# Patient Record
Sex: Male | Born: 1955 | Race: White | Hispanic: No | Marital: Married | Smoking: Never smoker
Health system: Southern US, Community
[De-identification: ages and names within clinical notes are randomized; demographics above are authoritative.]

## PROBLEM LIST (undated history)

## (undated) DIAGNOSIS — Z9889 Other specified postprocedural states: Secondary | ICD-10-CM

## (undated) DIAGNOSIS — Z9289 Personal history of other medical treatment: Secondary | ICD-10-CM

## (undated) DIAGNOSIS — K219 Gastro-esophageal reflux disease without esophagitis: Secondary | ICD-10-CM

## (undated) DIAGNOSIS — E8809 Other disorders of plasma-protein metabolism, not elsewhere classified: Secondary | ICD-10-CM

## (undated) DIAGNOSIS — E756 Lipid storage disorder, unspecified: Secondary | ICD-10-CM

## (undated) DIAGNOSIS — I2510 Atherosclerotic heart disease of native coronary artery without angina pectoris: Secondary | ICD-10-CM

## (undated) DIAGNOSIS — Z8679 Personal history of other diseases of the circulatory system: Secondary | ICD-10-CM

## (undated) DIAGNOSIS — I10 Essential (primary) hypertension: Secondary | ICD-10-CM

## (undated) DIAGNOSIS — I82409 Acute embolism and thrombosis of unspecified deep veins of unspecified lower extremity: Secondary | ICD-10-CM

## (undated) DIAGNOSIS — I712 Thoracic aortic aneurysm, without rupture: Secondary | ICD-10-CM

## (undated) DIAGNOSIS — J189 Pneumonia, unspecified organism: Secondary | ICD-10-CM

## (undated) HISTORY — DX: Thoracic aortic aneurysm, without rupture: I71.2

## (undated) HISTORY — DX: Other specified postprocedural states: Z98.890

## (undated) HISTORY — DX: Other disorders of plasma-protein metabolism, not elsewhere classified: E88.09

## (undated) HISTORY — PX: OTHER SURGICAL HISTORY: SHX169

## (undated) HISTORY — PX: EYE SURGERY: SHX253

## (undated) HISTORY — DX: Atherosclerotic heart disease of native coronary artery without angina pectoris: I25.10

## (undated) HISTORY — PX: TONSILLECTOMY: SUR1361

## (undated) HISTORY — DX: Lipid storage disorder, unspecified: E75.6

## (undated) HISTORY — DX: Acute embolism and thrombosis of unspecified deep veins of unspecified lower extremity: I82.409

## (undated) HISTORY — DX: Personal history of other diseases of the circulatory system: Z86.79

---

## 2002-08-02 DIAGNOSIS — I82409 Acute embolism and thrombosis of unspecified deep veins of unspecified lower extremity: Secondary | ICD-10-CM

## 2002-08-02 HISTORY — DX: Acute embolism and thrombosis of unspecified deep veins of unspecified lower extremity: I82.409

## 2002-10-26 ENCOUNTER — Emergency Department (HOSPITAL_COMMUNITY): Admission: EM | Admit: 2002-10-26 | Discharge: 2002-10-26 | Payer: Self-pay | Admitting: Emergency Medicine

## 2002-11-05 ENCOUNTER — Encounter: Payer: Self-pay | Admitting: Cardiology

## 2002-11-05 ENCOUNTER — Inpatient Hospital Stay (HOSPITAL_COMMUNITY): Admission: EM | Admit: 2002-11-05 | Discharge: 2002-11-08 | Payer: Self-pay | Admitting: Emergency Medicine

## 2002-11-06 ENCOUNTER — Encounter: Payer: Self-pay | Admitting: Cardiology

## 2007-12-29 ENCOUNTER — Emergency Department (HOSPITAL_COMMUNITY): Admission: EM | Admit: 2007-12-29 | Discharge: 2007-12-29 | Payer: Self-pay | Admitting: Emergency Medicine

## 2008-02-27 ENCOUNTER — Emergency Department (HOSPITAL_COMMUNITY): Admission: EM | Admit: 2008-02-27 | Discharge: 2008-02-27 | Payer: Self-pay | Admitting: Neurosurgery

## 2008-10-16 ENCOUNTER — Emergency Department (HOSPITAL_BASED_OUTPATIENT_CLINIC_OR_DEPARTMENT_OTHER): Admission: EM | Admit: 2008-10-16 | Discharge: 2008-10-16 | Payer: Self-pay | Admitting: Emergency Medicine

## 2008-10-16 ENCOUNTER — Ambulatory Visit: Payer: Self-pay | Admitting: Diagnostic Radiology

## 2008-11-09 ENCOUNTER — Emergency Department (HOSPITAL_BASED_OUTPATIENT_CLINIC_OR_DEPARTMENT_OTHER): Admission: EM | Admit: 2008-11-09 | Discharge: 2008-11-09 | Payer: Self-pay | Admitting: Emergency Medicine

## 2008-11-09 ENCOUNTER — Ambulatory Visit: Payer: Self-pay | Admitting: Radiology

## 2008-11-12 ENCOUNTER — Ambulatory Visit (HOSPITAL_COMMUNITY): Admission: RE | Admit: 2008-11-12 | Discharge: 2008-11-12 | Payer: Self-pay | Admitting: Orthopaedic Surgery

## 2009-10-12 ENCOUNTER — Emergency Department (HOSPITAL_COMMUNITY): Admission: EM | Admit: 2009-10-12 | Discharge: 2009-10-12 | Payer: Self-pay | Admitting: Emergency Medicine

## 2010-08-14 ENCOUNTER — Ambulatory Visit: Payer: Self-pay | Admitting: Cardiology

## 2010-08-18 ENCOUNTER — Emergency Department (HOSPITAL_COMMUNITY)
Admission: EM | Admit: 2010-08-18 | Discharge: 2010-08-19 | Payer: Self-pay | Source: Home / Self Care | Admitting: Emergency Medicine

## 2010-08-24 LAB — COMPREHENSIVE METABOLIC PANEL
ALT: 19 U/L (ref 0–53)
AST: 18 U/L (ref 0–37)
Albumin: 3.8 g/dL (ref 3.5–5.2)
CO2: 21 mEq/L (ref 19–32)
Chloride: 112 mEq/L (ref 96–112)
Creatinine, Ser: 1.24 mg/dL (ref 0.4–1.5)
GFR calc Af Amer: >60 mL/min (ref >60)
GFR calc non Af Amer: >60 mL/min (ref >60)
Sodium: 141 mEq/L (ref 135–145)
Total Bilirubin: 1.1 mg/dL (ref 0.3–1.2)

## 2010-08-24 LAB — CBC
Hemoglobin: 13.1 g/dL (ref 13.0–17.0)
MCH: 27 pg (ref 26.0–34.0)
MCHC: 33.6 g/dL (ref 30.0–36.0)
Platelets: 220 10*3/uL (ref 150–400)
RDW: 13.9 % (ref 11.5–15.5)

## 2010-08-24 LAB — URINALYSIS, ROUTINE W REFLEX MICROSCOPIC
Bilirubin Urine: NEGATIVE
Hgb urine dipstick: NEGATIVE
Protein, ur: NEGATIVE mg/dL
Specific Gravity, Urine: 1.021 (ref 1.005–1.030)
Urine Glucose, Fasting: NEGATIVE mg/dL
Urobilinogen, UA: 0.2 mg/dL (ref 0.0–1.0)

## 2010-08-24 LAB — DIFFERENTIAL
Basophils Relative: 0 % (ref 0–1)
Eosinophils Absolute: 0 10*3/uL (ref 0.0–0.7)
Eosinophils Relative: 0 % (ref 0–5)
Monocytes Absolute: 0.7 10*3/uL (ref 0.1–1.0)
Monocytes Relative: 5 % (ref 3–12)

## 2010-09-15 ENCOUNTER — Emergency Department (HOSPITAL_COMMUNITY)
Admission: EM | Admit: 2010-09-15 | Discharge: 2010-09-15 | Disposition: A | Discharge status: Home / Self Care | Payer: 59 | Attending: Emergency Medicine | Admitting: Emergency Medicine

## 2010-09-15 ENCOUNTER — Emergency Department (HOSPITAL_COMMUNITY): Payer: 59

## 2010-09-15 DIAGNOSIS — K5289 Other specified noninfective gastroenteritis and colitis: Secondary | ICD-10-CM | POA: Insufficient documentation

## 2010-09-15 DIAGNOSIS — R109 Unspecified abdominal pain: Secondary | ICD-10-CM | POA: Insufficient documentation

## 2010-09-15 DIAGNOSIS — R197 Diarrhea, unspecified: Secondary | ICD-10-CM | POA: Insufficient documentation

## 2010-09-15 DIAGNOSIS — E860 Dehydration: Secondary | ICD-10-CM | POA: Insufficient documentation

## 2010-09-15 LAB — DIFFERENTIAL
Basophils Absolute: 0 10*3/uL (ref 0.0–0.1)
Basophils Relative: 0 % (ref 0–1)
Eosinophils Absolute: 0 10*3/uL (ref 0.0–0.7)
Eosinophils Relative: 0 % (ref 0–5)
Monocytes Absolute: 0.5 10*3/uL (ref 0.1–1.0)

## 2010-09-15 LAB — COMPREHENSIVE METABOLIC PANEL
ALT: 30 U/L (ref 0–53)
AST: 26 U/L (ref 0–37)
Alkaline Phosphatase: 44 U/L (ref 39–117)
CO2: 22 mEq/L (ref 19–32)
Chloride: 107 mEq/L (ref 96–112)
GFR calc Af Amer: 55 mL/min — ABNORMAL LOW (ref >60)
GFR calc non Af Amer: 46 mL/min — ABNORMAL LOW (ref >60)
Glucose, Bld: 203 mg/dL — ABNORMAL HIGH (ref 70–99)
Sodium: 140 mEq/L (ref 135–145)
Total Bilirubin: 1.2 mg/dL (ref 0.3–1.2)

## 2010-09-15 LAB — CBC
HCT: 44.4 % (ref 39.0–52.0)
MCHC: 34.2 g/dL (ref 30.0–36.0)
Platelets: 216 10*3/uL (ref 150–400)
RDW: 13.8 % (ref 11.5–15.5)
WBC: 14.7 10*3/uL — ABNORMAL HIGH (ref 4.0–10.5)

## 2010-09-15 LAB — LIPASE, BLOOD: Lipase: 26 U/L (ref 11–59)

## 2010-09-18 ENCOUNTER — Other Ambulatory Visit: Payer: Self-pay | Admitting: Cardiology

## 2010-09-22 ENCOUNTER — Other Ambulatory Visit: Payer: 59

## 2010-09-24 ENCOUNTER — Ambulatory Visit (HOSPITAL_COMMUNITY)
Admission: RE | Admit: 2010-09-24 | Discharge: 2010-09-24 | Disposition: A | Discharge status: Home / Self Care | Payer: 59 | Source: Ambulatory Visit | Attending: Cardiology | Admitting: Cardiology

## 2010-09-24 DIAGNOSIS — K7689 Other specified diseases of liver: Secondary | ICD-10-CM | POA: Insufficient documentation

## 2010-09-24 DIAGNOSIS — R197 Diarrhea, unspecified: Secondary | ICD-10-CM | POA: Insufficient documentation

## 2010-09-24 DIAGNOSIS — R110 Nausea: Secondary | ICD-10-CM | POA: Insufficient documentation

## 2010-09-24 DIAGNOSIS — R109 Unspecified abdominal pain: Secondary | ICD-10-CM | POA: Insufficient documentation

## 2010-10-01 ENCOUNTER — Other Ambulatory Visit (HOSPITAL_COMMUNITY): Payer: Self-pay | Admitting: Gastroenterology

## 2010-10-01 DIAGNOSIS — R1011 Right upper quadrant pain: Secondary | ICD-10-CM

## 2010-10-20 ENCOUNTER — Encounter (HOSPITAL_COMMUNITY): Payer: Self-pay

## 2010-10-20 ENCOUNTER — Encounter (HOSPITAL_COMMUNITY)
Admission: RE | Admit: 2010-10-20 | Discharge: 2010-10-20 | Disposition: A | Discharge status: Home / Self Care | Payer: 59 | Source: Ambulatory Visit | Attending: Gastroenterology | Admitting: Gastroenterology

## 2010-10-20 DIAGNOSIS — R1011 Right upper quadrant pain: Secondary | ICD-10-CM | POA: Insufficient documentation

## 2010-10-20 MED ORDER — TECHNETIUM TC 99M MEBROFENIN IV KIT
4.5000 | PACK | Freq: Once | INTRAVENOUS | Status: AC | PRN
  Administered 2010-10-20: 5 via INTRAVENOUS

## 2010-10-20 MED ORDER — SINCALIDE 5 MCG IJ SOLR: 0.0200 ug/kg | Freq: Once | INTRAMUSCULAR | Status: DC

## 2010-11-12 LAB — DIFFERENTIAL
Basophils Absolute: 0 10*3/uL (ref 0.0–0.1)
Lymphocytes Relative: 26 % (ref 12–46)
Lymphs Abs: 2.3 10*3/uL (ref 0.7–4.0)
Monocytes Absolute: 0.5 10*3/uL (ref 0.1–1.0)
Neutro Abs: 5.7 10*3/uL (ref 1.7–7.7)

## 2010-11-12 LAB — BASIC METABOLIC PANEL
Calcium: 8.8 mg/dL (ref 8.4–10.5)
GFR calc Af Amer: >60 mL/min (ref >60)
GFR calc non Af Amer: >60 mL/min (ref >60)
Glucose, Bld: 95 mg/dL (ref 70–99)
Sodium: 142 mEq/L (ref 135–145)

## 2010-11-12 LAB — CBC
Hemoglobin: 14.2 g/dL (ref 13.0–17.0)
RDW: 13.5 % (ref 11.5–15.5)
WBC: 8.7 10*3/uL (ref 4.0–10.5)

## 2010-11-13 ENCOUNTER — Ambulatory Visit (HOSPITAL_COMMUNITY)
Admission: RE | Admit: 2010-11-13 | Discharge: 2010-11-13 | Disposition: A | Discharge status: Home / Self Care | Payer: 59 | Source: Ambulatory Visit | Attending: Gastroenterology | Admitting: Gastroenterology

## 2010-11-13 ENCOUNTER — Other Ambulatory Visit: Payer: Self-pay | Admitting: Gastroenterology

## 2010-11-13 DIAGNOSIS — K219 Gastro-esophageal reflux disease without esophagitis: Secondary | ICD-10-CM | POA: Insufficient documentation

## 2010-11-13 DIAGNOSIS — D130 Benign neoplasm of esophagus: Secondary | ICD-10-CM | POA: Insufficient documentation

## 2010-11-13 DIAGNOSIS — R110 Nausea: Secondary | ICD-10-CM | POA: Insufficient documentation

## 2010-11-13 DIAGNOSIS — R197 Diarrhea, unspecified: Secondary | ICD-10-CM | POA: Insufficient documentation

## 2010-11-13 DIAGNOSIS — R1013 Epigastric pain: Secondary | ICD-10-CM | POA: Insufficient documentation

## 2010-11-13 DIAGNOSIS — I10 Essential (primary) hypertension: Secondary | ICD-10-CM | POA: Insufficient documentation

## 2010-11-13 DIAGNOSIS — K5289 Other specified noninfective gastroenteritis and colitis: Secondary | ICD-10-CM | POA: Insufficient documentation

## 2010-12-18 NOTE — Discharge Summary (Signed)
NAME:  Lawrence Liu, Lawrence Liu                        ACCOUNT NO.:  [id number]   MEDICAL RECORD NO.:  [id number]                   PATIENT TYPE:  INP   LOCATION:  3032                                 FACILITY:  [facility]   PHYSICIAN:  Cassell Clement, M.D.              DATE OF BIRTH:  1955-11-17   DATE OF ADMISSION:  11/05/2002  DATE OF DISCHARGE:  11/08/2002                                 DISCHARGE SUMMARY   DISCHARGE DIAGNOSES:  1. Deep venous thrombosis of left leg.  2. Essential hypertension.  3. Esophageal reflux.   PROCEDURES:  None.   HISTORY OF PRESENT ILLNESS:  This 55 year old gentleman was admitted through  the emergency room because of left calf swelling.  There is no history of  any injury to the leg.  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  The patient has had no prior history of deep venous thrombosis.   ALLERGIES:  LATEX and also cannot mix PENICILLIN and SULFA, but can take  either one by itself.   SOCIAL HISTORY:  He is a nonsmoker.  He has had borderline blood pressure  elevations in the past.   PHYSICAL EXAMINATION:  VITAL SIGNS:  Blood pressure 130/95, pulse 80 and  regular, respirations normal.  HEENT:  Negative.  NECK:  Carotids negative.  CHEST:  Clear.  HEART:  No murmurs, rubs, gallop or click.  ABDOMEN:  Soft, nontender with no mass.  EXTREMITIES:  Good pulses with no edema.   LABORATORY DATA AND X-RAY FINDINGS:  We had a chest x-ray done in the  emergency room and results are pending.  The patient underwent an ultrasound  of the calves of his legs which did reveal the presence of a left deep  venous thrombosis in the left upper calf.   HOSPITAL COURSE:  The patient was started on Coumadin.  He was also started  on Lovenox until the Coumadin would therapeutic.  His blood pressure was  monitored carefully.  By November 07, 2002, blood pressure was improved.  A  2D  echocardiogram had shown low normal ejection fraction with a dilated aortic  groove, but no significant valvular heart disease.  The patient was anxious  to get out of the hospital.  On November 08, 2002, we felt that he was stable  enough to be discharged home, although he would need to stay on Lovenox  until his Coumadin was therapeutic and there was several days overlap.  The  patient did not develop any pleuritic type chest discomfort of increasing  dyspnea.  The left leg swelling and tenderness gradually improved over the  course of hospital stay.   DISCHARGE MEDICATIONS:  1. Lovenox 90 mg subcu every 12 hours.  2. Pepcid over-the-counter as needed for indigestion.  3. Altace 5 mg b.i.d.  4. Coumadin 5 mg  as directed.  5. Tylenol p.r.n.   ACTIVITY:  The patient will be resting at home.  Anticipate he will be out  of work at least until June 1.  He is to keep the left leg elevated when  sitting.   DIET:  He is to avoid added salt.   FOLLOW UP:  He will return to the office in one week for an office visit,  protime and BMET.  He is to call for an appointment.   CONDITION ON DISCHARGE:  Improved.                                               Cassell Clement, M.D.    TB/MEDQ  D:  12/10/2002  T:  12/12/2002  Job:  [id number]

## 2010-12-18 NOTE — H&P (Signed)
NAME:  Lawrence Liu, Lawrence Liu                        ACCOUNT NO.:  [id number]   MEDICAL RECORD NO.:  [id number]                   PATIENT TYPE:  INP   LOCATION:  3032                                 FACILITY:  [facility]   PHYSICIAN:  Cassell Clement, M.D.              DATE OF BIRTH:  Jan 01, 1956   DATE OF ADMISSION:  11/05/2002  DATE OF DISCHARGE:                                HISTORY & PHYSICAL   CHIEF COMPLAINT:  Deep vein thrombosis, left calf.   HISTORY OF PRESENT ILLNESS:  This is a 55 year old married Caucasian  gentleman who was admitted through the emergency room because of left calf  swelling.  He underwent a Doppler evaluation prior to admission which did  confirm the presence of left deep vein thrombosis.  There is no history of  any injury to the leg.  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   MEDICATIONS:  The patient is on no regular medicines.  He does not take any  daily aspirin.   PAST MEDICAL HISTORY:  Unremarkable.  The patient has a past history of  borderline diastolic hypertension for the past year.   ALLERGIES:  He does have a history of allergy to LATEX and also cannot mix  PENICILLIN and SULFA but can take either one by itself.   SOCIAL HISTORY:  He is a nonsmoker.  He does not use any alcohol.  He is  married, has one child and three stepchildren.   PAST SURGICAL HISTORY:  He has had no operations.   REVIEW OF SYSTEMS:  He does not get any regular intentional exercise except  for doing a lot of walking at work, and he plays baseball with his 12-year-  old son.   CARDIAC:  No history of angina.  GASTROINTESTINAL:  He has symptoms of GERD  and symptoms of fatty food intolerance.  He uses Tums and Pepcid p.r.n.  He  has had no hematochezia or melena.  GENITOURINARY:  He denies any dysuria,  nocturia,  or symptoms of prostate difficulty.  ENDOCRINE:  He is not  diabetic.  PULMONARY:  He was seen a week ago in the emergency room after  experiencing acute bronchospasm secondary to exposure to latex, to which he  is highly sensitive.  The remainder of his review of systems is negative in  detail.   FAMILY HISTORY:  Positive for diabetes, coronary disease, and hypertension.   PHYSICAL EXAMINATION:  VITAL SIGNS:  His blood pressure is 130/95, pulse is  80 and regular, respirations are normal.  HEENT:  Negative.  NECK:  The carotids have a normal upstroke and no bruits.  The thyroid is  not enlarged or tender.  There is no lymphadenopathy.  CHEST:  Clear.  CARDIAC:  The heart revealed a quiet precordium  without murmur, gallop, rub,  or click.  ABDOMEN:  Soft and nontender.  EXTREMITIES:  Femoral pulses are good.  Pedal pulses are good.  There is  slight swelling and tenderness on the left calf.  He has good peripheral  pulses.  There is no pitting edema in either leg.   LABORATORY DATA:  Chest x-ray was done in the emergency room, results  pending.  Ultrasound of calves reveals presence of a left deep vein  thrombosis in the left calf.   Laboratory studies are unremarkable with lipid panel pending.   IMPRESSION:  1. Left deep vein thrombosis of left calf.  2. Diastolic hypertension.  3. Positive  family history of coronary disease.  4. Gastroesophageal reflux disease by history.   DISPOSITION:  We are going to start him on Coumadin and also Lovenox.  Will  check fasting lipid panel.  Will treat with modified bed rest.  Anticipate  he will be hospitalized for several days, then home on Coumadin for three to  six months.                                                  Cassell Clement, M.D.    TB/MEDQ  D:  11/05/2002  T:  11/05/2002  Job:  [id number]

## 2011-01-04 ENCOUNTER — Other Ambulatory Visit: Payer: Self-pay | Admitting: Cardiology

## 2011-01-04 DIAGNOSIS — I119 Hypertensive heart disease without heart failure: Secondary | ICD-10-CM

## 2011-01-05 NOTE — Telephone Encounter (Signed)
escribe request  

## 2011-05-21 ENCOUNTER — Other Ambulatory Visit (HOSPITAL_COMMUNITY): Payer: Self-pay | Admitting: Orthopedic Surgery

## 2011-05-21 DIAGNOSIS — M25819 Other specified joint disorders, unspecified shoulder: Secondary | ICD-10-CM

## 2011-05-27 ENCOUNTER — Ambulatory Visit (HOSPITAL_COMMUNITY)
Admission: RE | Admit: 2011-05-27 | Discharge: 2011-05-27 | Disposition: A | Discharge status: Home / Self Care | Payer: 59 | Source: Ambulatory Visit | Attending: Orthopedic Surgery | Admitting: Orthopedic Surgery

## 2011-05-27 DIAGNOSIS — M719 Bursopathy, unspecified: Secondary | ICD-10-CM | POA: Insufficient documentation

## 2011-05-27 DIAGNOSIS — M25819 Other specified joint disorders, unspecified shoulder: Secondary | ICD-10-CM

## 2011-05-27 DIAGNOSIS — M67919 Unspecified disorder of synovium and tendon, unspecified shoulder: Secondary | ICD-10-CM | POA: Insufficient documentation

## 2011-05-27 DIAGNOSIS — M25519 Pain in unspecified shoulder: Secondary | ICD-10-CM | POA: Insufficient documentation

## 2011-06-28 ENCOUNTER — Emergency Department (HOSPITAL_COMMUNITY)
Admission: EM | Admit: 2011-06-28 | Discharge: 2011-06-28 | Disposition: A | Discharge status: Home / Self Care | Payer: 59 | Source: Home / Self Care | Attending: Family Medicine | Admitting: Family Medicine

## 2011-06-28 ENCOUNTER — Encounter (HOSPITAL_COMMUNITY): Payer: Self-pay

## 2011-06-28 DIAGNOSIS — J40 Bronchitis, not specified as acute or chronic: Secondary | ICD-10-CM

## 2011-06-28 HISTORY — DX: Essential (primary) hypertension: I10

## 2011-06-28 MED ORDER — AZITHROMYCIN 250 MG PO TABS: 250.0000 mg | ORAL_TABLET | Freq: Every day | ORAL | Status: AC

## 2011-06-28 MED ORDER — GUAIFENESIN-CODEINE 100-10 MG/5ML PO SYRP: 5.0000 mL | ORAL_SOLUTION | Freq: Four times a day (QID) | ORAL | Status: AC | PRN

## 2011-06-28 NOTE — ED Notes (Signed)
C/o head congestion, productive cough of clear sputum since Thursday.  Denies fever.  States he is scheduled for surgery on 07/15/11 and wants to get sx cleared up.

## 2011-06-28 NOTE — ED Provider Notes (Signed)
History     CSN: [id number] Arrival date & time: 06/28/2011 11:20 AM   First MD Initiated Contact with Patient 06/28/11 1049      Chief Complaint  Patient presents with  . Cough    (Consider location/radiation/quality/duration/timing/severity/associated sxs/prior treatment) Patient is a 55 y.o. male presenting with cough. The history is provided by the patient.  Cough This is a new problem. The current episode started more than 2 days ago. The problem occurs constantly. The problem has not changed since onset.The cough is non-productive. There has been no fever. Associated symptoms include headaches. Pertinent negatives include no chest pain, no rhinorrhea and no sore throat. He has tried cough syrup for the symptoms. The treatment provided mild relief. He is not a smoker.    Past Medical History  Diagnosis Date  . RUQ abdominal pain   . Hypertension     Past Surgical History  Procedure Date  . Tonsillectomy     History reviewed. No pertinent family history.  History  Substance Use Topics  . Smoking status: Never Smoker   . Smokeless tobacco: Not on file  . Alcohol Use: No      Review of Systems  Constitutional: Negative.   HENT: Negative for sore throat and rhinorrhea.   Respiratory: Positive for cough.   Cardiovascular: Negative for chest pain.  Musculoskeletal: Negative.   Neurological: Positive for headaches.    Allergies  Latex  Home Medications   Current Outpatient Rx  Name Route Sig Dispense Refill  . AMLODIPINE BESY-BENAZEPRIL HCL 5-20 MG PO CAPS  TAKE 1 CAPSULE BY MOUTH ONCE DAILY 90 capsule 3  . DEXLANSOPRAZOLE 60 MG PO CPDR Oral Take 60 mg by mouth daily.      . AZITHROMYCIN 250 MG PO TABS Oral Take 1 tablet (250 mg total) by mouth daily. Take first 2 tablets together, then 1 every day until finished. 6 tablet 0  . GUAIFENESIN-CODEINE 100-10 MG/5ML PO SYRP Oral Take 5 mLs by mouth every 6 (six) hours as needed for cough. 120 mL 0    BP 129/77   Pulse 85  Temp(Src) 98.4 F (36.9 C) (Oral)  Resp 12  SpO2 100%  Physical Exam  Nursing note and vitals reviewed. Constitutional: He appears well-nourished. No distress.  HENT:  Head: Normocephalic and atraumatic.  Right Ear: External ear normal.  Left Ear: External ear normal.  Mouth/Throat: Oropharynx is clear and moist.       Hearing aids worn  Neck: Normal range of motion. Neck supple.  Cardiovascular: Normal rate and regular rhythm.   Pulmonary/Chest: Effort normal and breath sounds normal. He has no wheezes. He has no rales.  Lymphadenopathy:    He has no cervical adenopathy.  Skin: Skin is warm and dry.    ED Course  Procedures (including critical care time)  Labs Reviewed - No data to display No results found.   1. Bronchitis       MDM          Randa Spike, MD 06/28/11 1148

## 2011-06-30 ENCOUNTER — Encounter (HOSPITAL_COMMUNITY): Payer: Self-pay | Admitting: Pharmacy Technician

## 2011-07-06 ENCOUNTER — Inpatient Hospital Stay (HOSPITAL_COMMUNITY): Admission: RE | Admit: 2011-07-06 | Payer: 59 | Source: Ambulatory Visit

## 2011-07-07 ENCOUNTER — Encounter (HOSPITAL_COMMUNITY): Payer: Self-pay

## 2011-07-07 ENCOUNTER — Encounter (HOSPITAL_COMMUNITY)
Admission: RE | Admit: 2011-07-07 | Discharge: 2011-07-07 | Disposition: A | Discharge status: Home / Self Care | Payer: 59 | Source: Ambulatory Visit | Attending: Orthopedic Surgery | Admitting: Orthopedic Surgery

## 2011-07-07 ENCOUNTER — Ambulatory Visit (HOSPITAL_COMMUNITY)
Admission: RE | Admit: 2011-07-07 | Discharge: 2011-07-07 | Disposition: A | Discharge status: Home / Self Care | Payer: 59 | Source: Ambulatory Visit | Attending: Anesthesiology | Admitting: Anesthesiology

## 2011-07-07 ENCOUNTER — Other Ambulatory Visit: Payer: Self-pay

## 2011-07-07 DIAGNOSIS — R059 Cough, unspecified: Secondary | ICD-10-CM | POA: Insufficient documentation

## 2011-07-07 DIAGNOSIS — R05 Cough: Secondary | ICD-10-CM | POA: Insufficient documentation

## 2011-07-07 DIAGNOSIS — Z01812 Encounter for preprocedural laboratory examination: Secondary | ICD-10-CM | POA: Insufficient documentation

## 2011-07-07 DIAGNOSIS — Z01810 Encounter for preprocedural cardiovascular examination: Secondary | ICD-10-CM | POA: Insufficient documentation

## 2011-07-07 DIAGNOSIS — M4120 Other idiopathic scoliosis, site unspecified: Secondary | ICD-10-CM | POA: Insufficient documentation

## 2011-07-07 DIAGNOSIS — J40 Bronchitis, not specified as acute or chronic: Secondary | ICD-10-CM | POA: Insufficient documentation

## 2011-07-07 DIAGNOSIS — Z01818 Encounter for other preprocedural examination: Secondary | ICD-10-CM | POA: Insufficient documentation

## 2011-07-07 HISTORY — DX: Gastro-esophageal reflux disease without esophagitis: K21.9

## 2011-07-07 LAB — SURGICAL PCR SCREEN
MRSA, PCR: NEGATIVE
Staphylococcus aureus: POSITIVE — AB

## 2011-07-07 LAB — BASIC METABOLIC PANEL
CO2: 26 mEq/L (ref 19–32)
Calcium: 9.2 mg/dL (ref 8.4–10.5)
Chloride: 107 mEq/L (ref 96–112)
Creatinine, Ser: 0.91 mg/dL (ref 0.50–1.35)
GFR calc Af Amer: >90 mL/min (ref >90)
Sodium: 139 mEq/L (ref 135–145)

## 2011-07-07 LAB — CBC
MCV: 78.9 fL (ref 78.0–100.0)
Platelets: 251 10*3/uL (ref 150–400)
RBC: 5.13 MIL/uL (ref 4.22–5.81)
RDW: 13.7 % (ref 11.5–15.5)
WBC: 8.4 10*3/uL (ref 4.0–10.5)

## 2011-07-07 MED ORDER — CHLORHEXIDINE GLUCONATE 4 % EX LIQD: 60.0000 mL | Freq: Once | CUTANEOUS | Status: DC

## 2011-07-07 NOTE — Pre-Procedure Instructions (Signed)
20 Lawrence Liu  07/07/2011   Your procedure is scheduled on:  12.13.12  Report to [facility] at530 AM.  Call this number if you have problems the morning of surgery: [phone]   Remember:   Do not eat food:After Midnight.  May have clear liquids: up to 4 Hours before arrival.  Clear liquids include soda, tea, black coffee, apple or grape juice, broth.  Take these medicines the morning of surgery with A SIP OF WATER:dexilant   Do not wear jewelry, make-up or nail polish.  Do not wear lotions, powders, or perfumes. You may wear deodorant.  Do not shave 48 hours prior to surgery.  Do not bring valuables to the hospital.  Contacts, dentures or bridgework may not be worn into surgery.  Leave suitcase in the car. After surgery it may be brought to your room.  For patients admitted to the hospital, checkout time is 11:00 AM the day of discharge.   Patients discharged the day of surgery will not be allowed to drive home.  Name and phone number of your driver: Claris Che [phone]  Special Instructions: CHG Shower Use Special Wash: 1/2 bottle night before surgery and 1/2 bottle morning of surgery.   Please read over the following fact sheets that you were given: Pain Booklet, Coughing and Deep Breathing, MRSA Information and Surgical Site Infection Prevention

## 2011-07-14 MED ORDER — CEFAZOLIN SODIUM-DEXTROSE 2-3 GM-% IV SOLR
2.0000 g | INTRAVENOUS | Status: AC
  Administered 2011-07-15: 2 g via INTRAVENOUS
  Filled 2011-07-14: qty 50

## 2011-07-14 MED ORDER — LACTATED RINGERS IV SOLN: INTRAVENOUS | Status: DC

## 2011-07-15 ENCOUNTER — Encounter (HOSPITAL_COMMUNITY): Payer: Self-pay | Admitting: Certified Registered"

## 2011-07-15 ENCOUNTER — Encounter (HOSPITAL_COMMUNITY)
Admission: RE | Disposition: A | Discharge status: Home / Self Care | Payer: Self-pay | Source: Ambulatory Visit | Attending: Orthopedic Surgery

## 2011-07-15 ENCOUNTER — Ambulatory Visit (HOSPITAL_COMMUNITY): Payer: 59 | Admitting: Certified Registered"

## 2011-07-15 ENCOUNTER — Encounter (HOSPITAL_COMMUNITY): Payer: Self-pay | Admitting: *Deleted

## 2011-07-15 ENCOUNTER — Ambulatory Visit (HOSPITAL_COMMUNITY)
Admission: RE | Admit: 2011-07-15 | Discharge: 2011-07-15 | Disposition: A | Discharge status: Home / Self Care | Payer: 59 | Source: Ambulatory Visit | Attending: Orthopedic Surgery | Admitting: Orthopedic Surgery

## 2011-07-15 DIAGNOSIS — I10 Essential (primary) hypertension: Secondary | ICD-10-CM | POA: Insufficient documentation

## 2011-07-15 DIAGNOSIS — F430 Acute stress reaction: Secondary | ICD-10-CM | POA: Insufficient documentation

## 2011-07-15 DIAGNOSIS — M7500 Adhesive capsulitis of unspecified shoulder: Secondary | ICD-10-CM | POA: Insufficient documentation

## 2011-07-15 DIAGNOSIS — M7541 Impingement syndrome of right shoulder: Secondary | ICD-10-CM

## 2011-07-15 DIAGNOSIS — M19019 Primary osteoarthritis, unspecified shoulder: Secondary | ICD-10-CM | POA: Insufficient documentation

## 2011-07-15 DIAGNOSIS — M25819 Other specified joint disorders, unspecified shoulder: Secondary | ICD-10-CM | POA: Insufficient documentation

## 2011-07-15 HISTORY — PX: SHOULDER ARTHROSCOPY: SHX128

## 2011-07-15 SURGERY — ARTHROSCOPY, SHOULDER
Anesthesia: Regional | Site: Shoulder | Laterality: Right | Wound class: Clean

## 2011-07-15 MED ORDER — BUPIVACAINE-EPINEPHRINE PF 0.5-1:200000 % IJ SOLN
INTRAMUSCULAR | Status: DC | PRN
  Administered 2011-07-15: 25 mL

## 2011-07-15 MED ORDER — PROPOFOL 10 MG/ML IV EMUL
INTRAVENOUS | Status: DC | PRN
  Administered 2011-07-15: 180 mg via INTRAVENOUS

## 2011-07-15 MED ORDER — OXYCODONE-ACETAMINOPHEN 5-325 MG PO TABS: 1.0000 | ORAL_TABLET | ORAL | Status: AC | PRN

## 2011-07-15 MED ORDER — MIDAZOLAM HCL 5 MG/5ML IJ SOLN
INTRAMUSCULAR | Status: DC | PRN
  Administered 2011-07-15: 2 mg via INTRAVENOUS

## 2011-07-15 MED ORDER — ROCURONIUM BROMIDE 100 MG/10ML IV SOLN
INTRAVENOUS | Status: DC | PRN
  Administered 2011-07-15: 50 mg via INTRAVENOUS

## 2011-07-15 MED ORDER — MIDAZOLAM HCL 2 MG/2ML IJ SOLN: 1.0000 mg | INTRAMUSCULAR | Status: DC | PRN

## 2011-07-15 MED ORDER — FENTANYL CITRATE 0.05 MG/ML IJ SOLN: 50.0000 ug | INTRAMUSCULAR | Status: DC | PRN

## 2011-07-15 MED ORDER — LORAZEPAM 2 MG/ML IJ SOLN: 1.0000 mg | Freq: Once | INTRAMUSCULAR | Status: DC | PRN

## 2011-07-15 MED ORDER — ONDANSETRON HCL 4 MG/2ML IJ SOLN
INTRAMUSCULAR | Status: DC | PRN
  Administered 2011-07-15: 4 mg via INTRAVENOUS

## 2011-07-15 MED ORDER — LACTATED RINGERS IV SOLN
INTRAVENOUS | Status: DC | PRN
  Administered 2011-07-15: 07:00:00 via INTRAVENOUS

## 2011-07-15 MED ORDER — GLYCOPYRROLATE 0.2 MG/ML IJ SOLN
INTRAMUSCULAR | Status: DC | PRN
  Administered 2011-07-15: .4 mg via INTRAVENOUS

## 2011-07-15 MED ORDER — LIDOCAINE HCL (CARDIAC) 20 MG/ML IV SOLN
INTRAVENOUS | Status: DC | PRN
  Administered 2011-07-15: 100 mg via INTRAVENOUS

## 2011-07-15 MED ORDER — HYDROMORPHONE HCL PF 1 MG/ML IJ SOLN: 0.2500 mg | INTRAMUSCULAR | Status: DC | PRN

## 2011-07-15 MED ORDER — FENTANYL CITRATE 0.05 MG/ML IJ SOLN
INTRAMUSCULAR | Status: DC | PRN
  Administered 2011-07-15: 50 ug via INTRAVENOUS

## 2011-07-15 MED ORDER — TEMAZEPAM 30 MG PO CAPS: 30.0000 mg | ORAL_CAPSULE | Freq: Every evening | ORAL | Status: AC | PRN

## 2011-07-15 MED ORDER — NAPROXEN 500 MG PO TABS: 500.0000 mg | ORAL_TABLET | Freq: Two times a day (BID) | ORAL | Status: AC

## 2011-07-15 MED ORDER — NEOSTIGMINE METHYLSULFATE 1 MG/ML IJ SOLN
INTRAMUSCULAR | Status: DC | PRN
  Administered 2011-07-15: 5 mg via INTRAVENOUS

## 2011-07-15 MED ORDER — PROMETHAZINE HCL 25 MG/ML IJ SOLN: 6.2500 mg | INTRAMUSCULAR | Status: DC | PRN

## 2011-07-15 MED ORDER — METHOCARBAMOL 500 MG PO TABS: 500.0000 mg | ORAL_TABLET | Freq: Three times a day (TID) | ORAL | Status: AC | PRN

## 2011-07-15 SURGICAL SUPPLY — 52 items
BLADE CUTTER GATOR 3.5 (BLADE) ×2 IMPLANT
BLADE GREAT WHITE 4.2 (BLADE) ×2 IMPLANT
BLADE SURG 11 STRL SS (BLADE) ×2 IMPLANT
BOOTCOVER CLEANROOM LRG (PROTECTIVE WEAR) ×4 IMPLANT
BUR OVAL 4.0 (BURR) ×2 IMPLANT
CANISTER SUCT LVC 12 LTR MEDI- (MISCELLANEOUS) ×2 IMPLANT
CANNULA ACUFLEX KIT 5X76 (CANNULA) ×2 IMPLANT
CLOTH BEACON ORANGE TIMEOUT ST (SAFETY) ×2 IMPLANT
CONNECTOR 5 IN 1 STRAIGHT STRL (MISCELLANEOUS) ×2 IMPLANT
DRAPE INCISE 23X17 IOBAN STRL (DRAPES) ×1
DRAPE INCISE IOBAN 23X17 STRL (DRAPES) ×1 IMPLANT
DRAPE STERI 35X30 U-POUCH (DRAPES) ×2 IMPLANT
DRAPE SURG 17X11 SM STRL (DRAPES) ×2 IMPLANT
DRAPE U-SHAPE 47X51 STRL (DRAPES) ×2 IMPLANT
DRSG PAD ABDOMINAL 8X10 ST (GAUZE/BANDAGES/DRESSINGS) ×2 IMPLANT
DURAPREP 26ML APPLICATOR (WOUND CARE) ×2 IMPLANT
ELECT REM PT RETURN 9FT ADLT (ELECTROSURGICAL) ×2
ELECTRODE REM PT RTRN 9FT ADLT (ELECTROSURGICAL) ×1 IMPLANT
GAUZE SPONGE 4X4 12PLY STRL LF (GAUZE/BANDAGES/DRESSINGS) ×2 IMPLANT
GLOVE BIO SURGEON STRL SZ7.5 (GLOVE) ×2 IMPLANT
GLOVE BIO SURGEON STRL SZ8 (GLOVE) ×2 IMPLANT
GLOVE EUDERMIC 7 POWDERFREE (GLOVE) ×2 IMPLANT
GLOVE SS BIOGEL STRL SZ 7.5 (GLOVE) ×1 IMPLANT
GLOVE SUPERSENSE BIOGEL SZ 7.5 (GLOVE) ×1
GOWN STRL NON-REIN LRG LVL3 (GOWN DISPOSABLE) ×2 IMPLANT
KIT BASIN OR (CUSTOM PROCEDURE TRAY) ×2 IMPLANT
KIT ROOM TURNOVER OR (KITS) ×2 IMPLANT
KIT SHOULDER TRACTION (DRAPES) ×2 IMPLANT
MANIFOLD NEPTUNE II (INSTRUMENTS) ×2 IMPLANT
NDL SUT 6 .5 CRC .975X.05 MAYO (NEEDLE) IMPLANT
NEEDLE MAYO TAPER (NEEDLE)
NEEDLE SPNL 18GX3.5 QUINCKE PK (NEEDLE) ×2 IMPLANT
NS IRRIG 1000ML POUR BTL (IV SOLUTION) ×2 IMPLANT
PACK SHOULDER (CUSTOM PROCEDURE TRAY) ×2 IMPLANT
PAD ARMBOARD 7.5X6 YLW CONV (MISCELLANEOUS) ×4 IMPLANT
SET ARTHROSCOPY TUBING (MISCELLANEOUS) ×1
SET ARTHROSCOPY TUBING LN (MISCELLANEOUS) ×1 IMPLANT
SLING ARM IMMOBILIZER LRG (SOFTGOODS) ×2 IMPLANT
SLING ARM IMMOBILIZER MED (SOFTGOODS) IMPLANT
SPONGE GAUZE 4X4 12PLY (GAUZE/BANDAGES/DRESSINGS) ×2 IMPLANT
SPONGE LAP 4X18 X RAY DECT (DISPOSABLE) ×2 IMPLANT
STRIP CLOSURE SKIN 1/2X4 (GAUZE/BANDAGES/DRESSINGS) ×2 IMPLANT
SUT MNCRL AB 3-0 PS2 18 (SUTURE) ×2 IMPLANT
SUT PDS AB 1 CT  36 (SUTURE)
SUT PDS AB 1 CT 36 (SUTURE) IMPLANT
SYR 20CC LL (SYRINGE) ×2 IMPLANT
TAPE PAPER 2X10 WHT MICROPORE (GAUZE/BANDAGES/DRESSINGS) ×2 IMPLANT
TAPE PAPER 3X10 WHT MICROPORE (GAUZE/BANDAGES/DRESSINGS) ×2 IMPLANT
TOWEL OR 17X24 6PK STRL BLUE (TOWEL DISPOSABLE) ×2 IMPLANT
TOWEL OR 17X26 10 PK STRL BLUE (TOWEL DISPOSABLE) ×2 IMPLANT
WAND SUCTION MAX 4MM 90S (SURGICAL WAND) ×2 IMPLANT
WATER STERILE IRR 1000ML POUR (IV SOLUTION) ×2 IMPLANT

## 2011-07-15 NOTE — Anesthesia Procedure Notes (Addendum)
Anesthesia Regional Block:  Interscalene brachial plexus block  Pre-Anesthetic Checklist: ,, timeout performed, Correct Patient, Correct Site, Correct Laterality, Correct Procedure, Correct Position, site marked, Risks and benefits discussed,  Surgical consent,  Pre-op evaluation,  At surgeon's request and post-op pain management  Laterality: Right  Prep: chloraprep       Needles:  Injection technique: Single-shot  Needle Type: Echogenic Needle     Needle Length: 5cm 5 cm Needle Gauge: 22 and 22 G    Additional Needles:  Procedures: ultrasound guided and nerve stimulator Interscalene brachial plexus block  Nerve Stimulator or Paresthesia:  Response: 0.5 mA,   Additional Responses:   Narrative:  Start time: 07/15/2011 7:12 AM End time: 07/15/2011 7:34 AM Injection made incrementally with aspirations every 5 mL. Anesthesiologist: Dr Gypsy Balsam  Additional Notes: Pop Chg prep, sterile tec 22 stim needle w/ stim down to 1.73ma Change to US guidance w/ needle visualization and stim down to .5ma Multiple neg asp Mar 0.5 w/epi 25cc No compl Dr Gypsy Balsam   Procedure Name: Intubation Date/Time: 07/15/2011 8:04 AM Performed by: Ellin Goodie Pre-anesthesia Checklist: Patient identified, Emergency Drugs available, Suction available and Patient being monitored Patient Re-evaluated:Patient Re-evaluated prior to inductionOxygen Delivery Method: Circle System Utilized Preoxygenation: Pre-oxygenation with 100% oxygen Intubation Type: IV induction Ventilation: Mask ventilation without difficulty Laryngoscope Size: Mac and 3 Grade View: Grade III Tube type: Oral Tube size: 7.5 mm Number of attempts: 2 Airway Equipment and Method: video-laryngoscopy Placement Confirmation: ETT inserted through vocal cords under direct vision,  positive ETCO2 and breath sounds checked- equal and bilateral Secured at: 22 cm Tube secured with: Tape Comments: Patient easy mask ventilation.  DL x 1 with  MAC 3 with limited view.  Glidescope used with good view of cords, ETT passed with direct vision.

## 2011-07-15 NOTE — Transfer of Care (Signed)
Immediate Anesthesia Transfer of Care Note  Patient: Lawrence Liu  Procedure(s) Performed:  ARTHROSCOPY SHOULDER - RIGHT ARTHROSCOPY SUBACROMIAL DECOMPRESSION AND DISTAL CLAVICLE RESECTION  Patient Location: PACU  Anesthesia Type: General  Level of Consciousness: awake  Airway & Oxygen Therapy: Patient Spontanous Breathing  Post-op Assessment: Report given to PACU RN  Post vital signs: stable  Complications: No apparent anesthesia complications

## 2011-07-15 NOTE — Anesthesia Postprocedure Evaluation (Signed)
  Anesthesia Post-op Note  Patient: Lawrence Liu  Procedure(s) Performed:  ARTHROSCOPY SHOULDER - RIGHT ARTHROSCOPY SUBACROMIAL DECOMPRESSION AND DISTAL CLAVICLE RESECTION  Patient Location: PACU  Anesthesia Type: GA combined with regional for post-op pain  Level of Consciousness: awake, alert  and oriented  Airway and Oxygen Therapy: Patient Spontanous Breathing  Post-op Pain: none  Post-op Assessment: Post-op Vital signs reviewed, Patient's Cardiovascular Status Stable, Respiratory Function Stable, Patent Airway, No signs of Nausea or vomiting and Pain level controlled  Post-op Vital Signs: stable  Complications: No apparent anesthesia complications

## 2011-07-15 NOTE — H&P (Signed)
Lawrence Liu    Chief Complaint: right shoulder pain HPI: The patient is a 55 y.o. male with chronic right shoulder imopingement  Past Medical History  Diagnosis Date  . RUQ abdominal pain   . Hypertension   . Blood transfusion     as a baby for jaundice  . GERD (gastroesophageal reflux disease)   . Arthritis     Past Surgical History  Procedure Date  . Tonsillectomy     History reviewed. No pertinent family history.  Social History:  reports that he has never smoked. He does not have any smokeless tobacco history on file. He reports that he does not drink alcohol or use illicit drugs.  Allergies:  Allergies  Allergen Reactions  . Latex Anaphylaxis  . Sulfa Antibiotics Other (See Comments)    Hives...only when mixed sulfa and pencillin together    Medications Prior to Admission  Medication Dose Route Frequency Provider Last Rate Last Dose  . ceFAZolin (ANCEF) IVPB 2 g/50 mL premix  2 g Intravenous 60 min Pre-Op Vania Rea Jigar Zielke      . fentaNYL (SUBLIMAZE) injection 50-100 mcg  50-100 mcg Intravenous PRN Bedelia Person, MD      . lactated ringers infusion   Intravenous Continuous Ralene Bathe, PA      . midazolam (VERSED) injection 1-2 mg  1-2 mg Intravenous PRN Bedelia Person, MD       Medications Prior to Admission  Medication Sig Dispense Refill  . amLODipine-benazepril (LOTREL) 5-20 MG per capsule TAKE 1 CAPSULE BY MOUTH ONCE DAILY  90 capsule  3    Physical Exam: Unchanged from office visit 11/14 with severe impingment. reexamined today  Vitals  Temp:  [98 F (36.7 C)] 98 F (36.7 C) (12/13 0552) Pulse Rate:  [79] 79  (12/13 0552) Resp:  [20] 20  (12/13 0552) BP: (133)/(78) 133/78 mmHg (12/13 0552) SpO2:  [96 %] 96 % (12/13 0552)  Assessment/Plan  Impression: right shoulder impingement  Plan of Action: right shoulder arthroscopy  Adriell Polansky M 07/15/2011, 7:34 AM

## 2011-07-15 NOTE — Op Note (Signed)
07/15/2011  9:10 AM  PATIENT:   Lawrence Liu  55 y.o. 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  ASSISTANTS: Shuford PAC   ANESTHESIA:   GET + ISB  EBL: min  SPECIMEN:  none  Drains: none   PATIENT DISPOSITION:  PACU - hemodynamically stable.    PLAN OF CARE: Discharge to home after PACU  Dictation# [id number]

## 2011-07-15 NOTE — Op Note (Signed)
NAMEMarland Liu  AVYAN, LIVESAY NO.:  [id number]  MEDICAL RECORD NO.:  [id number]  LOCATION:  [facility]                         FACILITY:  [facility]  PHYSICIAN:  Vania Rea. Shandrell Boda, M.D.  DATE OF BIRTH:  [birth year]  DATE OF PROCEDURE:  07/15/2011 DATE OF DISCHARGE:                              OPERATIVE REPORT   PREOPERATIVE DIAGNOSES: 1. Chronic right shoulder impingement syndrome. 2. Right shoulder acromioclavicular joint arthropathy.  POSTOPERATIVE DIAGNOSES: 1. Chronic right shoulder impingement syndrome. 2. Right shoulder acromioclavicular joint arthropathy. 3. Additional finding of some moderate adhesive capsulitis.  PROCEDURE: 1. Right shoulder examination under anesthesia. 2. Right shoulder manipulation under anesthesia. 3. Right shoulder glenohumeral joint diagnostic arthroscopy. 4. Arthroscopic subacromial decompression and bursectomy. 5. Arthroscopic distal clavicle resection.  SURGEON:  Vania Rea. Perian Tedder, MD  ASSISTANT:  Lucita Lora. Shuford, PA-C  ANESTHESIA:  General endotracheal as well as an interscalene block.  ESTIMATED BLOOD LOSS:  Minimal.  DRAINS:  None.  HISTORY:  Lawrence Liu is a 55 year old gentleman who has had chronic right shoulder pain with the symptomatic impingement syndrome and AC joint arthritis with ongoing pain and functional limitations refractory to prolonged attempts at conservative management.  Due to his ongoing pain and functional limitations, he is brought to the operating room at this time for planned right shoulder arthroscopy as described below.  Preoperatively counseled Mr. Traynham on treatment options as well as risks versus benefits thereof.  Possible surgical complications were reviewed including potential for bleeding, infection, neurovascular injury, persistent pain, loss of motion, anesthetic complication, and possible need for additional surgery.  He understands and accepts and agrees for planned procedure.  PROCEDURE IN  DETAIL:  After undergoing routine preop evaluation, the patient received prophylactic antibiotics.  An interscalene block was established in the holding area by the Anesthesia Department.  Placed supine on the op table, underwent smooth induction of a general endotracheal anesthesia utilizing the glide scope.  The patient was then turned to the left lateral decubitus position on a beanbag and appropriately padded and protected.  Right shoulder examination under anesthesia reveals some moderate restrictions in mobility achieving 120 degrees of forward elevation until resistance was encountered. Performed a gentle manipulation with audible and palpable release of adhesions achieving 160 degrees of abduction, 170 degrees forward elevation, 90 degrees of external and 80 degrees of internal rotation. Right arm was then suspended at 70 degrees abduction with 15 pounds of traction.  The right shoulder girdle region was sterilely prepped and draped in standard fashion.  Time-out was called.  Posterior portal was established in glenohumeral joint and a diagnostic arthroscopy was performed.  A hemarthrosis was evacuated.  Biceps anchor was stable. The labrum was found to be normal and intact.  The articular surfaces were in good condition.  Rotator cuff was carefully inspected and found to be intact.  The joint volume was at the lower limits of normal consistent with the clinical examination suggesting moderate adhesive capsulitis although there was not significant synovitis noted.  At this point, fluid and instruments then removed.  The arm was dropped down to 30 degrees abduction with the arthroscope introduced in the subacromial space through the posterior portal and direct lateral portal  was established in the subacromial space.  Abundant dense bursal tissue and fibrosis was encountered and this was all divided and excised with a combination of shaver and Stryker wand.  The wand was then used  to remove the periosteum from the undersurface of the anterior half of the acromion, and then a subacromial decompression was performed with a bur creating a type 1 morphology.  Portals were then established directly anterior to the distal clavicle and a distal clavicle resection was performed with a bur.  Care was taken to confirm visualization of the entire circumference of the distal clavicle to ensure adequate removal of bone.  We then completed the subacromial/subdeltoid bursectomy and hemostasis was then obtained.  The bursal surface of the rotator cuff was carefully inspected and found to be intact.  At this point, all fluid and instruments were then removed.  The portals were closed with Monocryl and Steri-Strips.  A bulky dry dressing was then taped about the right shoulder.  Right arm was placed in a sling.  The patient was then awakened, extubated, and taken to the recovery room in stable condition.     Vania Rea. Zebulun Deman, M.D.     KMS/MEDQ  D:  07/15/2011  T:  07/15/2011  Job:  [id number]

## 2011-07-15 NOTE — Anesthesia Preprocedure Evaluation (Addendum)
Anesthesia Evaluation  Patient identified by MRN, date of birth, ID band Patient awake    Reviewed: Allergy & Precautions, H&P , NPO status , Patient's Chart, lab work & pertinent test results  Airway Mallampati: II TM Distance: >3 FB Neck ROM: Full    Dental   Pulmonary    Pulmonary exam normal       Cardiovascular     Neuro/Psych    GI/Hepatic   Endo/Other    Renal/GU      Musculoskeletal   Abdominal   Peds  Hematology   Anesthesia Other Findings   Reproductive/Obstetrics                          Anesthesia Physical Anesthesia Plan  ASA: II  Anesthesia Plan: General   Post-op Pain Management:    Induction: Intravenous  Airway Management Planned: Oral ETT  Additional Equipment:   Intra-op Plan:   Post-operative Plan: Extubation in OR  Informed Consent: I have reviewed the patients History and Physical, chart, labs and discussed the procedure including the risks, benefits and alternatives for the proposed anesthesia with the patient or authorized representative who has indicated his/her understanding and acceptance.     Plan Discussed with: CRNA and Surgeon  Anesthesia Plan Comments:         Anesthesia Quick Evaluation

## 2011-07-15 NOTE — Preoperative (Signed)
Beta Blockers   Reason not to administer Beta Blockers:Not Applicable 

## 2011-07-15 NOTE — Anesthesia Postprocedure Evaluation (Signed)
  Anesthesia Post-op Note  Patient: Lawrence Liu  Procedure(s) Performed:  ARTHROSCOPY SHOULDER - RIGHT ARTHROSCOPY SUBACROMIAL DECOMPRESSION AND DISTAL CLAVICLE RESECTION  Patient Location: PACU  Anesthesia Type: General  Level of Consciousness: awake  Airway and Oxygen Therapy: Patient Spontanous Breathing  Post-op Pain: none  Post-op Assessment: Post-op Vital signs reviewed  Post-op Vital Signs: stable  Complications: No apparent anesthesia complications

## 2011-07-16 ENCOUNTER — Encounter (HOSPITAL_COMMUNITY): Payer: Self-pay | Admitting: Orthopedic Surgery

## 2011-08-23 ENCOUNTER — Other Ambulatory Visit: Payer: Self-pay | Admitting: Cardiology

## 2011-08-23 NOTE — Telephone Encounter (Signed)
Refilled cialis

## 2012-03-10 ENCOUNTER — Other Ambulatory Visit: Payer: Self-pay | Admitting: *Deleted

## 2012-03-10 DIAGNOSIS — I119 Hypertensive heart disease without heart failure: Secondary | ICD-10-CM

## 2012-03-10 MED ORDER — AMLODIPINE BESY-BENAZEPRIL HCL 5-20 MG PO CAPS: 1.0000 | ORAL_CAPSULE | Freq: Every day | ORAL | Status: DC

## 2012-03-10 NOTE — Telephone Encounter (Signed)
Refilled amlodipine 

## 2012-09-16 ENCOUNTER — Other Ambulatory Visit: Payer: Self-pay

## 2012-12-04 ENCOUNTER — Encounter: Payer: Self-pay | Admitting: Cardiology

## 2012-12-04 ENCOUNTER — Other Ambulatory Visit: Payer: Self-pay | Admitting: Cardiology

## 2012-12-14 IMAGING — CR DG ABDOMEN ACUTE W/ 1V CHEST
3 series · 3 of 3 positions shown · non-contrast
Comparison: Chest two views abdomen 08/18/2010.

CLINICAL DATA: Nausea, vomiting and diarrhea.

ACUTE ABDOMEN SERIES (ABDOMEN 2 VIEW & CHEST 1 VIEW)

[w chest pa]
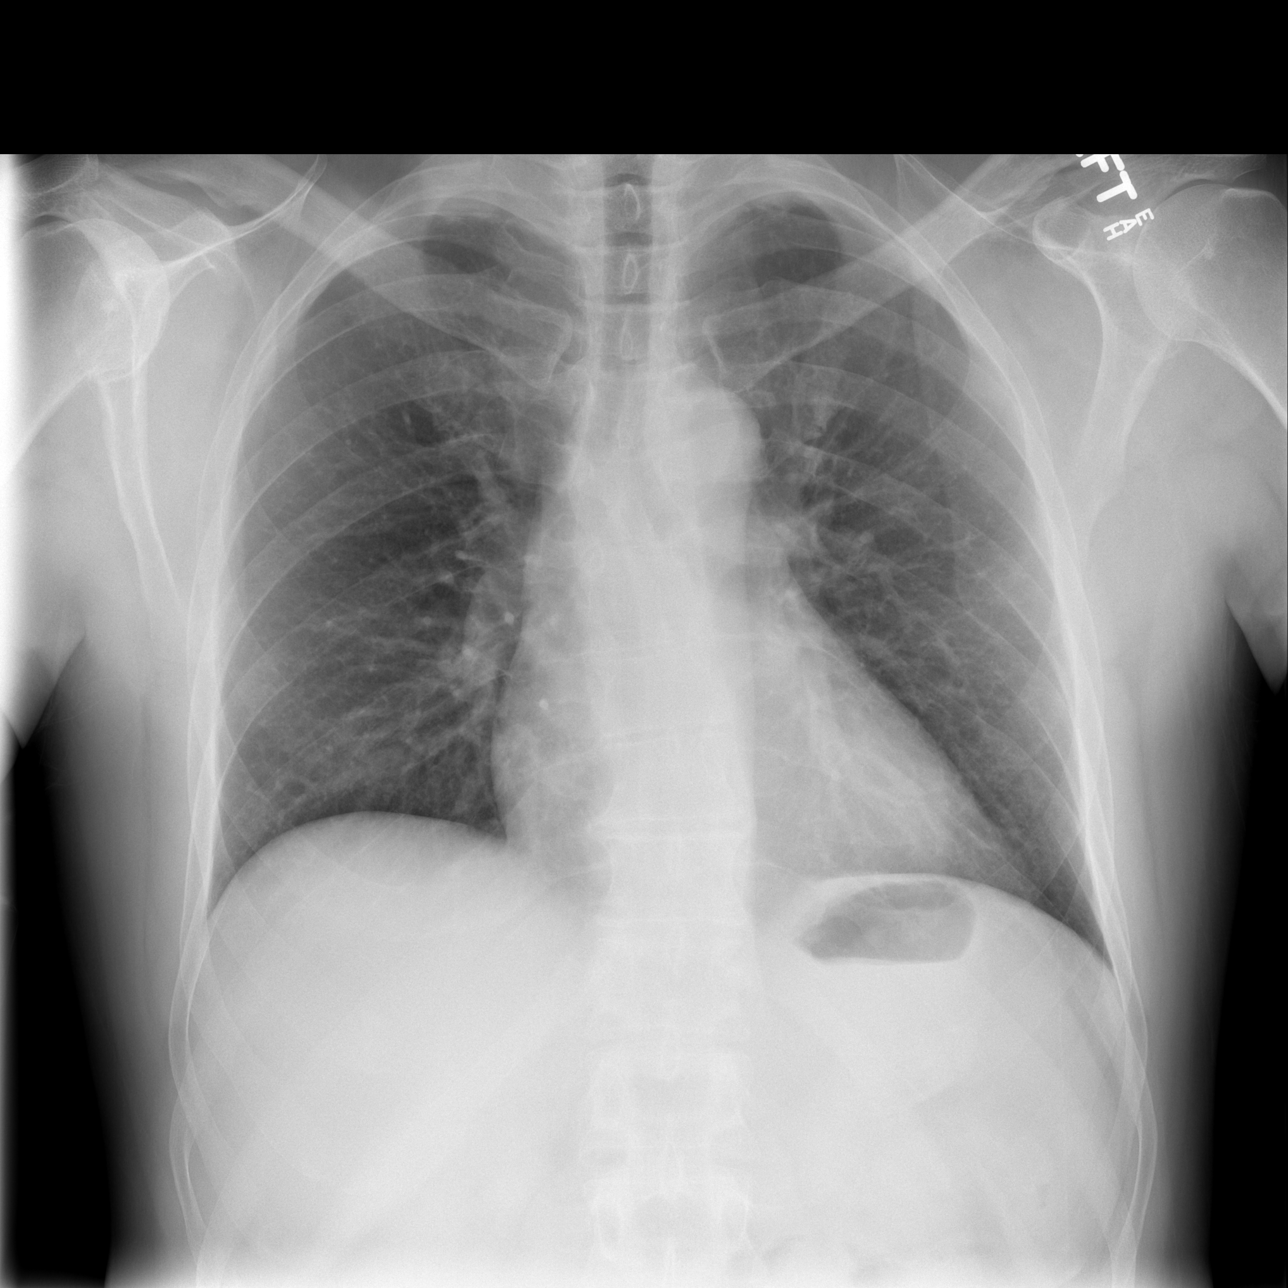

[w abdomen upright]
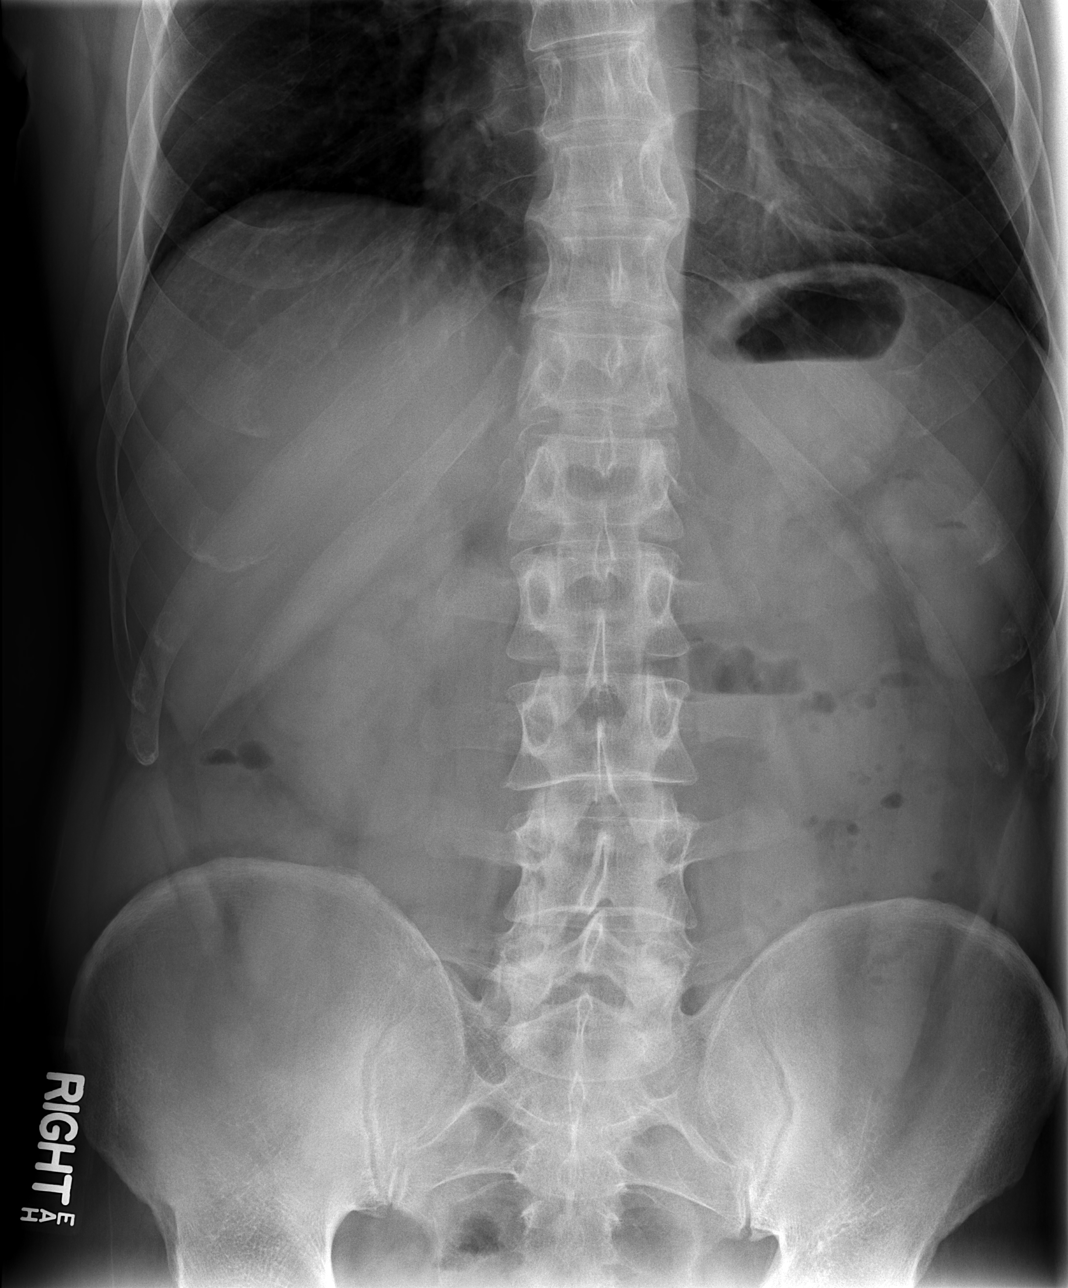

[t abdomen supine]
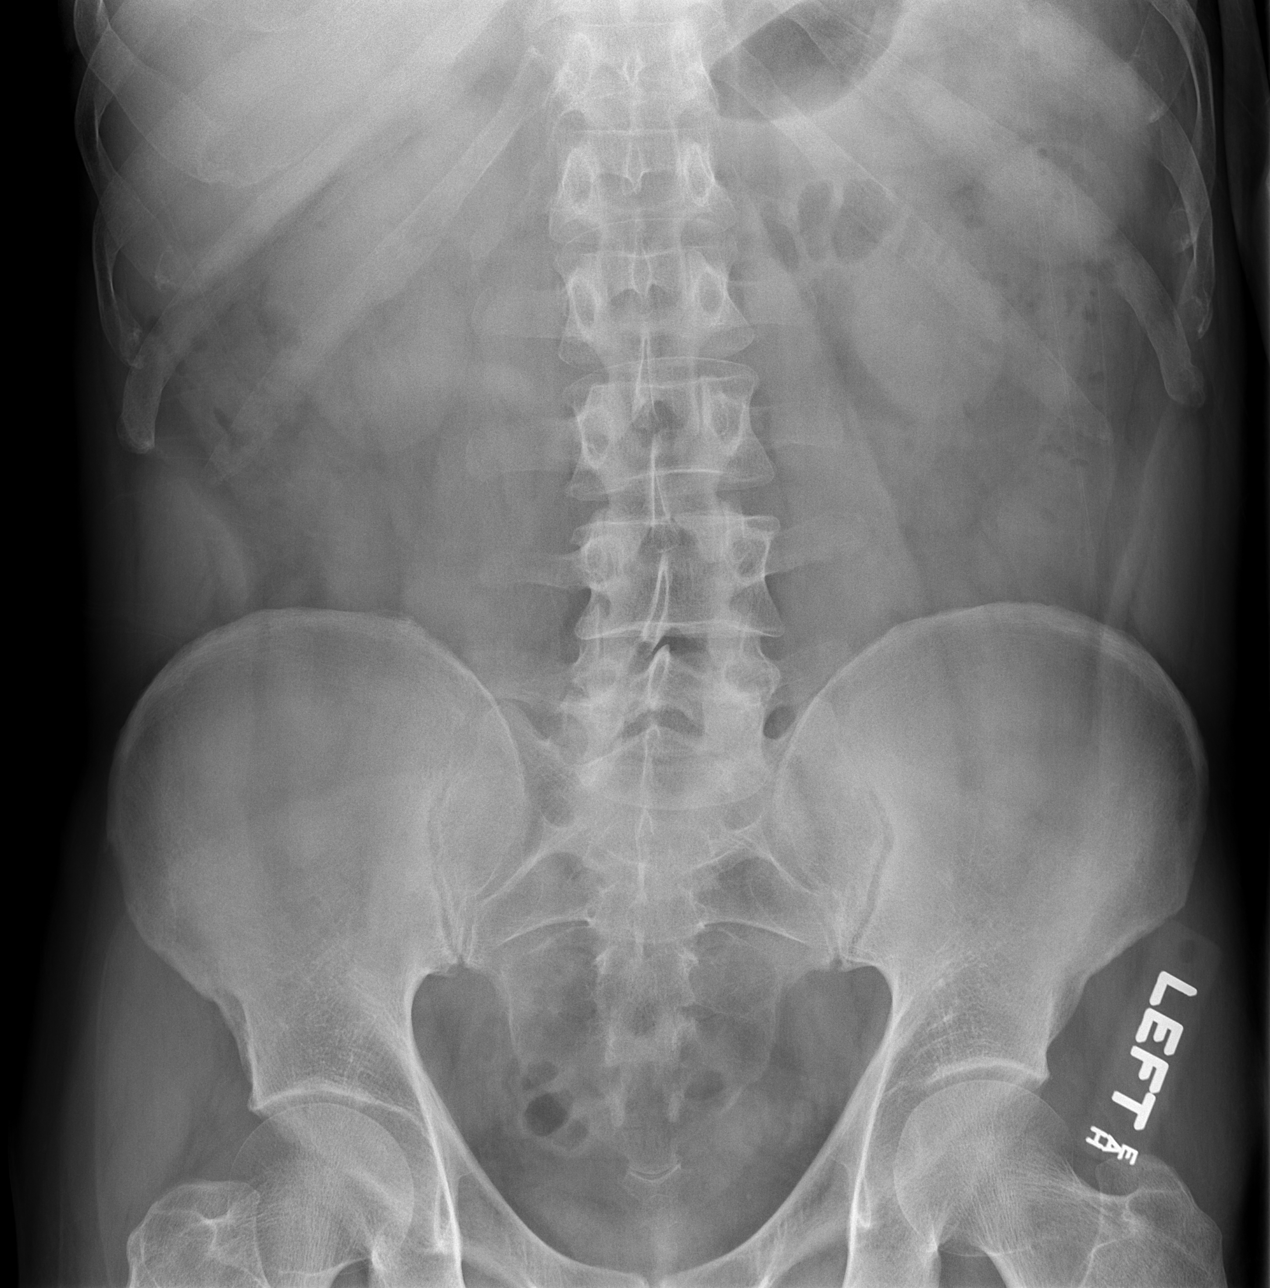

[3 of 3 positions shown; findings below may reference images not displayed]

FINDINGS: Single view of the chest demonstrates clear lungs and
normal heart size.  No pneumothorax or pleural effusion.

Two views of the abdomen show no free intraperitoneal air.  Bowel
gas pattern is unremarkable.
IMPRESSION: Negative exam.

## 2013-06-07 ENCOUNTER — Other Ambulatory Visit: Payer: Self-pay

## 2013-07-30 ENCOUNTER — Telehealth: Payer: Self-pay | Admitting: Family Medicine

## 2013-07-30 DIAGNOSIS — I119 Hypertensive heart disease without heart failure: Secondary | ICD-10-CM

## 2013-07-30 NOTE — Telephone Encounter (Signed)
Pt has apt with Dr Tanya Nones on Monday to get his bp medicine refilled however he is completely out and would like to know if he can get a refill to last him till then Call back number is [phone]

## 2013-07-31 MED ORDER — AMLODIPINE BESY-BENAZEPRIL HCL 5-20 MG PO CAPS: 1.0000 | ORAL_CAPSULE | Freq: Every day | ORAL | Status: DC

## 2013-07-31 NOTE — Telephone Encounter (Signed)
.  Rx Refilled and pt must keep appt.on Monday

## 2013-08-06 ENCOUNTER — Ambulatory Visit: Payer: Self-pay | Admitting: Family Medicine

## 2013-09-20 ENCOUNTER — Encounter: Payer: Self-pay | Admitting: Family Medicine

## 2013-09-20 ENCOUNTER — Other Ambulatory Visit: Payer: Self-pay | Admitting: *Deleted

## 2013-09-20 ENCOUNTER — Ambulatory Visit (INDEPENDENT_AMBULATORY_CARE_PROVIDER_SITE_OTHER): Payer: 59 | Admitting: Family Medicine

## 2013-09-20 VITALS — BP 142/72 | HR 64 | Temp 97.1°F | Resp 18 | Ht 72.0 in | Wt 214.0 lb

## 2013-09-20 DIAGNOSIS — I10 Essential (primary) hypertension: Secondary | ICD-10-CM

## 2013-09-20 MED ORDER — TADALAFIL 20 MG PO TABS: 20.0000 mg | ORAL_TABLET | Freq: Every day | ORAL | Status: DC | PRN

## 2013-09-20 MED ORDER — METOPROLOL SUCCINATE ER 25 MG PO TB24: 25.0000 mg | ORAL_TABLET | Freq: Every day | ORAL | Status: DC

## 2013-09-20 NOTE — Progress Notes (Signed)
Subjective:    Patient ID: Lawrence Liu, male    DOB: 26-Sep-1955, 59 y.o.   MRN: [id number]  HPI Patient is here today for routine followup of his hypertension. He is current on Lotrel 5/20 one by mouth daily.  His blood pressure is elevated 142/72. The patient states he's been having frequent palpitations. It tends to worsen with activity or strenuous exercise. He feels like if he coughs, he "can reset his heart."  This is generally associated with heavy activity. He denies any angina. He denies any dyspnea on exertion. He denies any orthopnea or paroxysmal nocturnal dyspnea. He has no edema in the legs. He is overdue for fasting lab work. He is already contacted his cardiologist and plans to make an appointment to see him. Past Medical History  Diagnosis Date  . RUQ abdominal pain   . Hypertension   . Blood transfusion     as a baby for jaundice  . GERD (gastroesophageal reflux disease)   . Arthritis   . Deep vein thrombosis     Left deep vein thrombosis of left calf   Current Outpatient Prescriptions on File Prior to Visit  Medication Sig Dispense Refill  . amLODipine-benazepril (LOTREL) 5-20 MG per capsule Take 1 capsule by mouth daily.  90 capsule  0  . CIALIS 20 MG tablet TAKE AS DIRECTED  6 tablet  PRN   No current facility-administered medications on file prior to visit.   Allergies  Allergen Reactions  . Latex Anaphylaxis  . Penicillins   . Sulfa Antibiotics Other (See Comments)    Hives...only when mixed sulfa and pencillin together   History   Social History  . Marital Status: Legally Separated    Spouse Name: N/A    Number of Children: N/A  . Years of Education: N/A   Occupational History  . Not on file.   Social History Main Topics  . Smoking status: Never Smoker   . Smokeless tobacco: Not on file  . Alcohol Use: No  . Drug Use: No  . Sexual Activity:    Other Topics Concern  . Not on file   Social History Narrative  . No narrative on file       Review of Systems  All other systems reviewed and are negative.       Objective:   Physical Exam  Vitals reviewed. Constitutional: He appears well-developed and well-nourished. No distress.  HENT:  Right Ear: External ear normal.  Left Ear: External ear normal.  Mouth/Throat: Oropharynx is clear and moist. No oropharyngeal exudate.  Neck: Neck supple. No JVD present. No thyromegaly present.  Cardiovascular: Normal rate, regular rhythm and normal heart sounds.  Exam reveals no gallop and no friction rub.   No murmur heard. Pulmonary/Chest: Effort normal and breath sounds normal. No respiratory distress. He has no wheezes. He has no rales. He exhibits no tenderness.  Abdominal: Soft. Bowel sounds are normal. He exhibits no distension. There is no tenderness. There is no rebound and no guarding.  Musculoskeletal: He exhibits no edema.  Lymphadenopathy:    He has no cervical adenopathy.  Skin: He is not diaphoretic.   on exam today, I appreciate frequent PVCs and PACs.    EKG today in the office reveals normal sinus rhythm at 72 beats per minute with normal intervals and normal axis. There is no evidence of ischemia or infarction on his EKG. I am able to capture one PVC.      Assessment & Plan:  1. HTN (hypertension) Right the patient to return fasting for a CMP and fasting lipid panel. Given the fact his blood pressure is slightly elevated and he is having frequent PVCs and PACs, I recommend starting the patient on Toprol-XL 25 mg by mouth daily to help lower his blood pressure and also prevent the frequent palpitations. Also recommended that he follow with his cardiologist for a stress test to rule out cardiac ischemia. - COMPLETE METABOLIC PANEL WITH GFR; Future - Lipid panel; Future - EKG 12-Lead

## 2013-09-20 NOTE — Telephone Encounter (Signed)
Medication refilled

## 2013-09-30 DIAGNOSIS — I2510 Atherosclerotic heart disease of native coronary artery without angina pectoris: Secondary | ICD-10-CM

## 2013-09-30 HISTORY — DX: Atherosclerotic heart disease of native coronary artery without angina pectoris: I25.10

## 2013-09-30 HISTORY — PX: TRANSTHORACIC ECHOCARDIOGRAM: SHX275

## 2013-10-02 ENCOUNTER — Emergency Department (HOSPITAL_COMMUNITY): Payer: 59

## 2013-10-02 ENCOUNTER — Encounter (HOSPITAL_COMMUNITY): Payer: Self-pay | Admitting: Emergency Medicine

## 2013-10-02 ENCOUNTER — Observation Stay (HOSPITAL_COMMUNITY)
Admission: EM | Admit: 2013-10-02 | Discharge: 2013-10-04 | Disposition: A | Discharge status: Home / Self Care | Payer: 59 | Attending: Family Medicine | Admitting: Family Medicine

## 2013-10-02 DIAGNOSIS — I10 Essential (primary) hypertension: Secondary | ICD-10-CM | POA: Insufficient documentation

## 2013-10-02 DIAGNOSIS — I493 Ventricular premature depolarization: Secondary | ICD-10-CM | POA: Diagnosis present

## 2013-10-02 DIAGNOSIS — R079 Chest pain, unspecified: Secondary | ICD-10-CM | POA: Diagnosis present

## 2013-10-02 DIAGNOSIS — R0789 Other chest pain: Principal | ICD-10-CM | POA: Diagnosis present

## 2013-10-02 DIAGNOSIS — Z8249 Family history of ischemic heart disease and other diseases of the circulatory system: Secondary | ICD-10-CM | POA: Insufficient documentation

## 2013-10-02 DIAGNOSIS — I2510 Atherosclerotic heart disease of native coronary artery without angina pectoris: Secondary | ICD-10-CM | POA: Insufficient documentation

## 2013-10-02 DIAGNOSIS — K219 Gastro-esophageal reflux disease without esophagitis: Secondary | ICD-10-CM | POA: Diagnosis present

## 2013-10-02 DIAGNOSIS — I82409 Acute embolism and thrombosis of unspecified deep veins of unspecified lower extremity: Secondary | ICD-10-CM

## 2013-10-02 DIAGNOSIS — Z882 Allergy status to sulfonamides status: Secondary | ICD-10-CM | POA: Insufficient documentation

## 2013-10-02 DIAGNOSIS — R0602 Shortness of breath: Secondary | ICD-10-CM | POA: Insufficient documentation

## 2013-10-02 DIAGNOSIS — M129 Arthropathy, unspecified: Secondary | ICD-10-CM | POA: Insufficient documentation

## 2013-10-02 DIAGNOSIS — R110 Nausea: Secondary | ICD-10-CM | POA: Insufficient documentation

## 2013-10-02 DIAGNOSIS — Z91040 Latex allergy status: Secondary | ICD-10-CM | POA: Insufficient documentation

## 2013-10-02 DIAGNOSIS — Z880 Allergy status to penicillin: Secondary | ICD-10-CM | POA: Insufficient documentation

## 2013-10-02 DIAGNOSIS — Z86718 Personal history of other venous thrombosis and embolism: Secondary | ICD-10-CM

## 2013-10-02 DIAGNOSIS — R42 Dizziness and giddiness: Secondary | ICD-10-CM | POA: Insufficient documentation

## 2013-10-02 DIAGNOSIS — M1990 Unspecified osteoarthritis, unspecified site: Secondary | ICD-10-CM | POA: Insufficient documentation

## 2013-10-02 DIAGNOSIS — I4949 Other premature depolarization: Secondary | ICD-10-CM | POA: Insufficient documentation

## 2013-10-02 DIAGNOSIS — Z79899 Other long term (current) drug therapy: Secondary | ICD-10-CM | POA: Insufficient documentation

## 2013-10-02 HISTORY — DX: Personal history of other medical treatment: Z92.89

## 2013-10-02 LAB — BASIC METABOLIC PANEL
BUN: 17 mg/dL (ref 6–23)
CALCIUM: 8.4 mg/dL (ref 8.4–10.5)
CO2: 24 mEq/L (ref 19–32)
CREATININE: 0.92 mg/dL (ref 0.50–1.35)
Chloride: 109 mEq/L (ref 96–112)
Glucose, Bld: 137 mg/dL — ABNORMAL HIGH (ref 70–99)
Potassium: 4 mEq/L (ref 3.7–5.3)
Sodium: 144 mEq/L (ref 137–147)

## 2013-10-02 LAB — LIPID PANEL
Cholesterol: 117 mg/dL (ref 0–200)
HDL: 39 mg/dL — ABNORMAL LOW (ref >39)
LDL Cholesterol: 56 mg/dL (ref 0–99)
TRIGLYCERIDES: 112 mg/dL (ref <150)
Total CHOL/HDL Ratio: 3 RATIO
VLDL: 22 mg/dL (ref 0–40)

## 2013-10-02 LAB — D-DIMER, QUANTITATIVE: D-Dimer, Quant: 0.33 ug/mL-FEU (ref 0.00–0.48)

## 2013-10-02 LAB — CBC
HCT: 39.7 % (ref 39.0–52.0)
HEMATOCRIT: 38.9 % — AB (ref 39.0–52.0)
HEMOGLOBIN: 13.5 g/dL (ref 13.0–17.0)
Hemoglobin: 13.3 g/dL (ref 13.0–17.0)
MCH: 26.9 pg (ref 26.0–34.0)
MCH: 26.7 pg (ref 26.0–34.0)
MCHC: 34.2 g/dL (ref 30.0–36.0)
MCHC: 34 g/dL (ref 30.0–36.0)
MCV: 78.6 fL (ref 78.0–100.0)
MCV: 78.5 fL (ref 78.0–100.0)
PLATELETS: 189 10*3/uL (ref 150–400)
Platelets: 191 10*3/uL (ref 150–400)
RBC: 4.95 MIL/uL (ref 4.22–5.81)
RBC: 5.06 MIL/uL (ref 4.22–5.81)
RDW: 13.9 % (ref 11.5–15.5)
RDW: 13.8 % (ref 11.5–15.5)
WBC: 7 10*3/uL (ref 4.0–10.5)
WBC: 6.5 10*3/uL (ref 4.0–10.5)

## 2013-10-02 LAB — I-STAT TROPONIN, ED
TROPONIN I, POC: 0 ng/mL (ref 0.00–0.08)
Troponin i, poc: 0.01 ng/mL (ref 0.00–0.08)

## 2013-10-02 LAB — TROPONIN I: Troponin I: <0.3 ng/mL (ref <0.30)

## 2013-10-02 LAB — PRO B NATRIURETIC PEPTIDE: Pro B Natriuretic peptide (BNP): 12.8 pg/mL (ref 0–125)

## 2013-10-02 LAB — CREATININE, SERUM: Creatinine, Ser: 0.84 mg/dL (ref 0.50–1.35)

## 2013-10-02 MED ORDER — HEPARIN SODIUM (PORCINE) 5000 UNIT/ML IJ SOLN
5000.0000 [IU] | Freq: Three times a day (TID) | INTRAMUSCULAR | Status: DC
  Administered 2013-10-02 – 2013-10-04 (×4): 5000 [IU] via SUBCUTANEOUS
  Filled 2013-10-02 (×8): qty 1

## 2013-10-02 MED ORDER — METOPROLOL SUCCINATE ER 25 MG PO TB24
25.0000 mg | ORAL_TABLET | Freq: Every day | ORAL | Status: DC
  Administered 2013-10-03 – 2013-10-04 (×2): 25 mg via ORAL
  Filled 2013-10-02 (×2): qty 1

## 2013-10-02 MED ORDER — BENAZEPRIL HCL 20 MG PO TABS
20.0000 mg | ORAL_TABLET | Freq: Every day | ORAL | Status: DC
  Administered 2013-10-03 – 2013-10-04 (×2): 20 mg via ORAL
  Filled 2013-10-02 (×2): qty 1

## 2013-10-02 MED ORDER — PANTOPRAZOLE SODIUM 40 MG PO TBEC
40.0000 mg | DELAYED_RELEASE_TABLET | Freq: Every day | ORAL | Status: DC
  Administered 2013-10-03 – 2013-10-04 (×2): 40 mg via ORAL
  Filled 2013-10-02 (×3): qty 1

## 2013-10-02 MED ORDER — ASPIRIN EC 325 MG PO TBEC: 325.0000 mg | DELAYED_RELEASE_TABLET | Freq: Every day | ORAL | Status: DC

## 2013-10-02 MED ORDER — ACETAMINOPHEN 325 MG PO TABS
650.0000 mg | ORAL_TABLET | ORAL | Status: DC | PRN
  Administered 2013-10-03: 650 mg via ORAL
  Filled 2013-10-02 (×2): qty 2

## 2013-10-02 MED ORDER — SODIUM CHLORIDE 0.9 % IJ SOLN
3.0000 mL | Freq: Two times a day (BID) | INTRAMUSCULAR | Status: DC
  Administered 2013-10-02 – 2013-10-04 (×3): 3 mL via INTRAVENOUS

## 2013-10-02 MED ORDER — AMLODIPINE BESYLATE 5 MG PO TABS
5.0000 mg | ORAL_TABLET | Freq: Every day | ORAL | Status: DC
  Administered 2013-10-03 – 2013-10-04 (×2): 5 mg via ORAL
  Filled 2013-10-02 (×2): qty 1

## 2013-10-02 MED ORDER — AMLODIPINE BESY-BENAZEPRIL HCL 5-20 MG PO CAPS: 1.0000 | ORAL_CAPSULE | Freq: Every day | ORAL | Status: DC

## 2013-10-02 MED ORDER — MORPHINE SULFATE 2 MG/ML IJ SOLN: 1.0000 mg | INTRAMUSCULAR | Status: DC | PRN

## 2013-10-02 MED ORDER — NITROGLYCERIN 0.4 MG SL SUBL
0.4000 mg | SUBLINGUAL_TABLET | SUBLINGUAL | Status: DC | PRN
  Administered 2013-10-02: 0.4 mg via SUBLINGUAL

## 2013-10-02 NOTE — H&P (Signed)
Hospitalist Admission History and Physical  Patient name: Lawrence Liu Medical record number: [id number] Date of birth: 1955/11/01 Age: 58 y.o. Gender: male  Primary Care Provider: Odette Fraction, MD  Chief Complaint: Chest pain/chest discomfort  History of Present Illness:This is a 58 y.o. year old male with prior hx/o HTN and remote hx/o DVT presenting with chest pain/discomfort. Pt works with [employer]. Pt states that he has had intermittent chest discomfort over the past year. Was seen by cardiologist Dr. Mare Ferrari about this. Was diagnosed with PVCs and started on metoprolol last week. Pt states that he has had progressive episode chest pain/discomfort over the past week. Chest pain worse with rest as well as exertion. CP is central with radiation to the back. No assd nausea or diaphoresis. Pain is sometimes worse with movement.  Fairly constant. Has sensation of dyspnea assd with chest discomfort. CP seems to improve with cough. Also with generalized fatigue over this time frame. Had episode of CP that woke pt up from sleep this am. Pain was 8/10 at the time. Went to firehouse around corner from his house was brought to hospital via EMS. Received SL NTG x 1 w/ improvement in CP.  In the ER, workup has been essentially WNL. CXR, EKG, trops, d-dimer all within normal limits. Cardiology also consulted by EDP.  Heart score: 2-3  CV risk factors: HTN, + family hx/o heart disease in father with MI in 57s and multiple CABGs.  Patient Active Problem List   Diagnosis Date Noted  . Chest tightness 10/02/2013  . PVC's (premature ventricular contractions) 10/02/2013  . History of DVT (deep vein thrombosis) 10/02/2013  . Chest pain 10/02/2013  . GERD (gastroesophageal reflux disease)   . Arthritis   . Deep vein thrombosis    Past Medical History: Past Medical History  Diagnosis Date  . RUQ abdominal pain   . Hypertension   . Blood transfusion     as a baby for jaundice  . GERD  (gastroesophageal reflux disease)   . Arthritis   . Deep vein thrombosis     Left deep vein thrombosis of left calf    Past Surgical History: Past Surgical History  Procedure Laterality Date  . Tonsillectomy    . Shoulder arthroscopy  07/15/2011    Procedure: ARTHROSCOPY SHOULDER;  Surgeon: Metta Clines Supple;  Location: [facility];  Service: Orthopedics;  Laterality: Right;  RIGHT ARTHROSCOPY SUBACROMIAL DECOMPRESSION AND DISTAL CLAVICLE RESECTION    Social History: History   Social History  . Marital Status: Legally Separated    Spouse Name: N/A    Number of Children: N/A  . Years of Education: N/A   Social History Main Topics  . Smoking status: Never Smoker   . Smokeless tobacco: None  . Alcohol Use: No  . Drug Use: No  . Sexual Activity:    Other Topics Concern  . None   Social History Narrative  . None    Family History: Family History  Problem Relation Age of Onset  . Heart disease Father   . Diabetes Father     Allergies: Allergies  Allergen Reactions  . Latex Anaphylaxis  . Penicillins   . Sulfa Antibiotics Other (See Comments)    Hives...only when mixed sulfa and pencillin together    Current Facility-Administered Medications  Medication Dose Route Frequency Provider Last Rate Last Dose  . amLODipine-benazepril (LOTREL) 5-20 MG per capsule 1 capsule  1 capsule Oral Daily Shanda Howells, MD      . aspirin  EC tablet 325 mg  325 mg Oral Daily Shanda Howells, MD      . heparin injection 5,000 Units  5,000 Units Subcutaneous 3 times per day Shanda Howells, MD      . metoprolol succinate (TOPROL-XL) 24 hr tablet 25 mg  25 mg Oral Daily Shanda Howells, MD      . morphine 2 MG/ML injection 1 mg  1 mg Intravenous Q2H PRN Shanda Howells, MD      . nitroGLYCERIN (NITROSTAT) SL tablet 0.4 mg  0.4 mg Sublingual Q5 min PRN Shanda Howells, MD      . pantoprazole (PROTONIX) EC tablet 40 mg  40 mg Oral Daily Shanda Howells, MD      . sodium chloride 0.9 % injection 3 mL  3 mL  Intravenous Q12H Shanda Howells, MD       Current Outpatient Prescriptions  Medication Sig Dispense Refill  . amLODipine-benazepril (LOTREL) 5-20 MG per capsule Take 1 capsule by mouth daily.  90 capsule  0  . aspirin EC 81 MG tablet Take 81 mg by mouth daily.      . metoprolol succinate (TOPROL-XL) 25 MG 24 hr tablet Take 1 tablet (25 mg total) by mouth daily.  30 tablet  5  . pantoprazole (PROTONIX) 40 MG tablet Take 40 mg by mouth daily.      . tadalafil (CIALIS) 20 MG tablet Take 1 tablet (20 mg total) by mouth daily as needed for erectile dysfunction.  6 tablet  PRN   Review Of Systems: 12 point ROS negative except as noted above in HPI.  Physical Exam: Filed Vitals:   10/02/13 1900  BP: 127/85  Pulse: 78  Temp:   Resp: 15    General: alert and cooperative HEENT: PERRLA and extra ocular movement intact Heart: S1, S2 normal, no murmur, rub or gallop, regular rate and rhythm Lungs: clear to auscultation, no wheezes or rales, unlabored breathing and mild anterior chest wall TTP  Abdomen: abdomen is soft without significant tenderness, masses, organomegaly or guarding Extremities: extremities normal, atraumatic, no cyanosis or edema Skin:no rashes, no ecchymoses Neurology: normal without focal findings  Labs and Imaging: Lab Results  Component Value Date/Time   NA 144 10/02/2013  5:04 PM   K 4.0 10/02/2013  5:04 PM   CL 109 10/02/2013  5:04 PM   CO2 24 10/02/2013  5:04 PM   BUN 17 10/02/2013  5:04 PM   CREATININE 0.92 10/02/2013  5:04 PM   GLUCOSE 137* 10/02/2013  5:04 PM   Lab Results  Component Value Date   WBC 7.0 10/02/2013   HGB 13.3 10/02/2013   HCT 38.9* 10/02/2013   MCV 78.6 10/02/2013   PLT 189 10/02/2013   Dg Chest Port 1 View  10/02/2013   CLINICAL DATA:  Chest pain and cough  EXAM: PORTABLE CHEST - 1 VIEW  COMPARISON:  07/07/2011  FINDINGS: The heart size and mediastinal contours are within normal limits. Both lungs are clear. The visualized skeletal structures are unremarkable.   IMPRESSION: No active disease.   Electronically Signed   By: Daryll Brod M.D.   On: 10/02/2013 17:09   Trop neg x 1   Assessment and Plan: Lawrence Liu is a 58 y.o. year old male presenting with chest pain  Chest Pain: sxs somewhat atypical, though with some CV RFs. Some costochondritic sxs on exam. Cardiology consulted. Cycle CEs. Risk stratification labs includinr TSH, A1C, lipid panel. Full dose ASA. Prn NTG. Prn morphine for pain. Tele  bed.   HTN: BPs stable. Continue home regimen.   FEN/GI: heart healthy diet. Full code  Prophylaxis: sub q heparin  Disposition: pending further evaluation  Code Status:Full Code.         Shanda Howells MD  Pager: [phone]

## 2013-10-02 NOTE — ED Provider Notes (Signed)
CSN: [id number]     Arrival date & time 10/02/13  1636 History   First MD Initiated Contact with Patient 10/02/13 1643     Chief Complaint  Patient presents with  . Chest Pain     (Consider location/radiation/quality/duration/timing/severity/associated sxs/prior Treatment) Patient is a 58 y.o. male presenting with chest pain.  Chest Pain Pain location:  Substernal area Pain quality: sharp   Radiates to: back, shoulders. Pain severity:  Severe Onset quality:  Gradual Duration: present throughout the night last night (pt works nights).  Then much worse when he awoke today. Timing:  Constant Progression:  Worsening Chronicity:  Recurrent Context comment:  Similar pains getting worse for past few weeks. Relieved by:  Nitroglycerin Worsened by:  Nothing tried Ineffective treatments: walking around. Associated symptoms: dizziness (upon standing, chronic), nausea and shortness of breath (worsening DOE)   Associated symptoms: no abdominal pain, no cough, no diaphoresis, no fever and not vomiting   Associated symptoms comment:  Aching in groin   Past Medical History  Diagnosis Date  . RUQ abdominal pain   . Hypertension   . Blood transfusion     as a baby for jaundice  . GERD (gastroesophageal reflux disease)   . Arthritis   . Deep vein thrombosis     Left deep vein thrombosis of left calf   Past Surgical History  Procedure Laterality Date  . Tonsillectomy    . Shoulder arthroscopy  07/15/2011    Procedure: ARTHROSCOPY SHOULDER;  Surgeon: Metta Clines Supple;  Location: [facility];  Service: Orthopedics;  Laterality: Right;  RIGHT ARTHROSCOPY SUBACROMIAL DECOMPRESSION AND DISTAL CLAVICLE RESECTION   Family History  Problem Relation Age of Onset  . Heart disease Father   . Diabetes Father    History  Substance Use Topics  . Smoking status: Never Smoker   . Smokeless tobacco: Not on file  . Alcohol Use: No    Review of Systems  Constitutional: Negative for fever and diaphoresis.   HENT: Negative for congestion.   Respiratory: Positive for shortness of breath (worsening DOE). Negative for cough.   Cardiovascular: Positive for chest pain.  Gastrointestinal: Positive for nausea. Negative for vomiting, abdominal pain and diarrhea.  Neurological: Positive for dizziness (upon standing, chronic).  All other systems reviewed and are negative.      Allergies  Latex; Penicillins; and Sulfa antibiotics  Home Medications   Current Outpatient Rx  Name  Route  Sig  Dispense  Refill  . amLODipine-benazepril (LOTREL) 5-20 MG per capsule   Oral   Take 1 capsule by mouth daily.   90 capsule   0   . metoprolol succinate (TOPROL-XL) 25 MG 24 hr tablet   Oral   Take 1 tablet (25 mg total) by mouth daily.   30 tablet   5   . pantoprazole (PROTONIX) 40 MG tablet   Oral   Take 40 mg by mouth daily.         . tadalafil (CIALIS) 20 MG tablet   Oral   Take 1 tablet (20 mg total) by mouth daily as needed for erectile dysfunction.   6 tablet   PRN    BP 129/75  Temp(Src) 97.8 F (36.6 C) (Oral)  Resp 18  Ht 6' (1.829 m)  Wt 210 lb (95.255 kg)  BMI 28.47 kg/m2  SpO2 100% Physical Exam  Nursing note and vitals reviewed. Constitutional: He is oriented to person, place, and time. He appears well-developed and well-nourished. No distress.  HENT:  Head:  Normocephalic and atraumatic.  Mouth/Throat: Oropharynx is clear and moist.  Eyes: Conjunctivae are normal. Pupils are equal, round, and reactive to light. No scleral icterus.  Neck: Neck supple.  Cardiovascular: Normal rate, regular rhythm, normal heart sounds and intact distal pulses.   No murmur heard. Pulmonary/Chest: Effort normal and breath sounds normal. No stridor. No respiratory distress. He has no wheezes. He has no rales.  Abdominal: Soft. He exhibits no distension. There is no tenderness. There is no rigidity, no rebound and no guarding.  Musculoskeletal: Normal range of motion. He exhibits no edema.        Right hip: He exhibits normal range of motion, normal strength and no tenderness.       Left hip: He exhibits normal range of motion, normal strength and no tenderness.  2+ fem pulses bilaterally  Neurological: He is alert and oriented to person, place, and time.  Skin: Skin is warm and dry. No rash noted.  Psychiatric: He has a normal mood and affect. His behavior is normal.    ED Course  Procedures (including critical care time) Labs Review Labs Reviewed  CBC - Abnormal; Notable for the following:    HCT 38.9 (*)    All other components within normal limits  BASIC METABOLIC PANEL - Abnormal; Notable for the following:    Glucose, Bld 137 (*)    All other components within normal limits  CBC WITH DIFFERENTIAL - Abnormal; Notable for the following:    Hemoglobin 12.4 (*)    HCT 36.6 (*)    All other components within normal limits  HEMOGLOBIN A1C - Abnormal; Notable for the following:    Hemoglobin A1C 6.1 (*)    Mean Plasma Glucose 128 (*)    All other components within normal limits  LIPID PANEL - Abnormal; Notable for the following:    HDL 39 (*)    All other components within normal limits  PRO B NATRIURETIC PEPTIDE  D-DIMER, QUANTITATIVE  CBC  CREATININE, SERUM  TROPONIN I  COMPREHENSIVE METABOLIC PANEL  TSH  TROPONIN I  TROPONIN I  I-STAT TROPOININ, ED  Randolm Idol, ED   Imaging Review Dg Chest Port 1 View  10/02/2013   CLINICAL DATA:  Chest pain and cough  EXAM: PORTABLE CHEST - 1 VIEW  COMPARISON:  07/07/2011  FINDINGS: The heart size and mediastinal contours are within normal limits. Both lungs are clear. The visualized skeletal structures are unremarkable.  IMPRESSION: No active disease.   Electronically Signed   By: Daryll Brod M.D.   On: 10/02/2013 17:09  All radiology studies independently viewed by me.      EKG Interpretation   Date/Time:  Tuesday October 02 2013 16:45:46 EST Ventricular Rate:  90 PR Interval:  182 QRS Duration: 97 QT  Interval:  351 QTC Calculation: 429 R Axis:   44 Text Interpretation:  Sinus rhythm Paired ventricular premature complexes  compared to prior, PVCs now present Confirmed by [facility]  MD, TREY ([id number])  on 10/02/2013 5:12:31 PM      MDM  Clinical impression Chest pain  58 yo male with hx of HTN and family hx of CAD presenting with chest pain.  Reports having brief episodes of chest pain over past few weeks that seem to be escalating.  Became worse and constant starting last night.  Suddenly worse a few hours ago.  Better with nitro.  Initial ED workup reassuring.  However, consulted cardiology for evaluation due to escalating nature of symptoms.  Admitted to  internal medicine.       Houston Siren III, MD 10/03/13 [phone]

## 2013-10-02 NOTE — ED Notes (Signed)
Pt states he recently started metoprolol and sometimes has episodes of dizziness.

## 2013-10-02 NOTE — Consult Note (Signed)
CARDIOLOGY CONSULT NOTE   Patient ID: GRIFFYN KUCINSKI MRN: [id number] DOB/AGE: 1955/12/17 58 y.o.  Admit Date: 10/02/2013  Primary Physician: Odette Fraction, MD  Primary Cardiologist   [place]   Clinical Summary Mr. Manetta is a 57 y.o.male. He is well to most of the cardiologist in the system. He has been involved with the [facility] system since its inception. He knows a great deal about acute cardiac problems. Historically he has been healthy. Recently he's had exertional fatigue. In addition it appears that he senses single PVCs. He's been very concerned about this. Plans were being made for him to have an exercise test with Dr. Mare Ferrari. Over the last few days he felt worse. This morning he awoke with chest discomfort. This has lasted for several hours. He thinks the nitroglycerin helped when he arrived at the emergency room. His troponins are normal. His EKG shows no significant change. However he does have one ventricular couplet noted on the 12-lead EKG.  At times he appears to note a premature beat. He then comments that coughing helps with this.   Allergies  Allergen Reactions  . Latex Anaphylaxis  . Penicillins   . Sulfa Antibiotics Other (See Comments)    Hives...only when mixed sulfa and pencillin together    Medications Scheduled Medications:    Infusions:    PRN Medications:        Past Medical History  Diagnosis Date  . RUQ abdominal pain   . Hypertension   . Blood transfusion     as a baby for jaundice  . GERD (gastroesophageal reflux disease)   . Arthritis   . Deep vein thrombosis     Left deep vein thrombosis of left calf    Past Surgical History  Procedure Laterality Date  . Tonsillectomy    . Shoulder arthroscopy  07/15/2011    Procedure: ARTHROSCOPY SHOULDER;  Surgeon: Metta Clines Supple;  Location: [facility];  Service: Orthopedics;  Laterality: Right;  RIGHT ARTHROSCOPY SUBACROMIAL DECOMPRESSION AND DISTAL CLAVICLE RESECTION     Family History  Problem Relation Age of Onset  . Heart disease Father   . Diabetes Father     Social History Mr. Aiello reports that he has never smoked. He does not have any smokeless tobacco history on file. Mr. Applegate reports that he does not drink alcohol.  Review of Systems Patient denies fever, chills, headache, sweats, rash, change in vision, change in hearing, nausea vomiting, urinary symptoms. All other systems are reviewed and are negative.  Physical Examination Blood pressure 127/85, pulse 78, temperature 97.8 F (36.6 C), temperature source Oral, resp. rate 15, height 6' (1.829 m), weight 210 lb (95.255 kg), SpO2 99.00%. No intake or output data in the 24 hours ending 10/02/13 1913   Patient is oriented to person time and place. Affect is normal. His wife is in the room. There is no jugulovenous distention. Lungs are clear. Respiratory effort is nonlabored. Cardiac exam reveals S1 and S2. There no clicks or significant murmurs. The abdomen is soft. There's no peripheral edema. There are no musculoskeletal deformities. There are no skin rashes.  Telemetry:     Telemetry reveals normal sinus rhythm. There are scattered PVCs.   Prior Cardiac Testing/Procedures  Lab Results  Basic Metabolic Panel:  Recent Labs Lab 10/02/13 1704  NA 144  K 4.0  CL 109  CO2 24  GLUCOSE 137*  BUN 17  CREATININE 0.92  CALCIUM 8.4    Liver Function Tests: No results  found for this basename: AST, ALT, ALKPHOS, BILITOT, PROT, ALBUMIN,  in the last 168 hours  CBC:  Recent Labs Lab 10/02/13 1704  WBC 7.0  HGB 13.3  HCT 38.9*  MCV 78.6  PLT 189    Cardiac Enzymes: No results found for this basename: CKTOTAL, CKMB, CKMBINDEX, TROPONINI,  in the last 168 hours  BNP: No components found with this basename: POCBNP,    Radiology: Dg Chest Port 1 View  10/02/2013   CLINICAL DATA:  Chest pain and cough  EXAM: PORTABLE CHEST - 1 VIEW  COMPARISON:  07/07/2011  FINDINGS: The  heart size and mediastinal contours are within normal limits. Both lungs are clear. The visualized skeletal structures are unremarkable.  IMPRESSION: No active disease.   Electronically Signed   By: Daryll Brod M.D.   On: 10/02/2013 17:09     ECG:   EKG is done today and reviewed by me. It is compared with a tracing of the past several weeks. There is no significant change. A ventricular couplet is noted.   Impression and Recommendations    Chest tightness     At this point the patient's chest tightness is concerning. There is no diagnostic EKG change. Troponins are normal despite significant prolonged pain. He does have PVCs. At this point I'm not convinced that this represents an acute coronary syndrome. This is a patient that knows a great deal about cardiovascular disease. It is not clear if this is increasing his concern. Regardless, we need to be very careful and complete with his evaluation. He says that some of his discomfort goes through the back. His mediastinum is not widened on chest x-ray. There is a history of DVT in the past. However the d-dimer is normal. At this point it would be prudent to admit him for observation. Further troponins should be obtained. My preference would be to use IV heparin during the night. I would not use IV nitroglycerin. After we obtain more information from his enzymes, decisions can be made as to whether or not CT scan of the chest or further ischemic workup is most appropriate. He may require both.    PVC's (premature ventricular contractions)    The patient senses every PVC. I do believe he is very worried about this. The plan for now will be to continue to monitor his rhythm.    History of DVT (deep vein thrombosis)    At this point there is no evidence of recurrent deep venous thrombosis. With this prior history however, it may be most appropriate to proceed with CT scanning to be sure that he is not having chronic recurrent pulmonary emboli.  Daryel November, MD   10/02/2013, 7:13 PM

## 2013-10-02 NOTE — ED Notes (Signed)
Dr. Doy Mince at the bedside.

## 2013-10-02 NOTE — ED Notes (Signed)
Per [facility], pt from home for cp since this morning. Went home to rest, stated it went away with rest. Woke up and felt a tightness in his chest through to his back and between shoulder blades. When he coughs the pain goes away. Has had a 250 ml bolus. Has a 20g to [place] and was given 1 NTG and 324 mg ASA. SR on the monitor with multifocal PVC's.

## 2013-10-03 ENCOUNTER — Encounter (HOSPITAL_COMMUNITY)
Admission: EM | Disposition: A | Discharge status: Home / Self Care | Payer: Self-pay | Source: Home / Self Care | Attending: Emergency Medicine

## 2013-10-03 DIAGNOSIS — I369 Nonrheumatic tricuspid valve disorder, unspecified: Secondary | ICD-10-CM

## 2013-10-03 DIAGNOSIS — I10 Essential (primary) hypertension: Secondary | ICD-10-CM

## 2013-10-03 DIAGNOSIS — I4949 Other premature depolarization: Secondary | ICD-10-CM

## 2013-10-03 DIAGNOSIS — I2510 Atherosclerotic heart disease of native coronary artery without angina pectoris: Secondary | ICD-10-CM

## 2013-10-03 HISTORY — PX: LEFT HEART CATHETERIZATION WITH CORONARY ANGIOGRAM: SHX5451

## 2013-10-03 LAB — CBC WITH DIFFERENTIAL/PLATELET
Basophils Absolute: 0 10*3/uL (ref 0.0–0.1)
Basophils Relative: 0 % (ref 0–1)
EOS PCT: 4 % (ref 0–5)
Eosinophils Absolute: 0.2 10*3/uL (ref 0.0–0.7)
HEMATOCRIT: 36.6 % — AB (ref 39.0–52.0)
HEMOGLOBIN: 12.4 g/dL — AB (ref 13.0–17.0)
LYMPHS ABS: 2.3 10*3/uL (ref 0.7–4.0)
Lymphocytes Relative: 41 % (ref 12–46)
MCH: 26.7 pg (ref 26.0–34.0)
MCHC: 33.9 g/dL (ref 30.0–36.0)
MCV: 78.7 fL (ref 78.0–100.0)
MONOS PCT: 6 % (ref 3–12)
Monocytes Absolute: 0.3 10*3/uL (ref 0.1–1.0)
Neutro Abs: 2.8 10*3/uL (ref 1.7–7.7)
Neutrophils Relative %: 50 % (ref 43–77)
PLATELETS: 163 10*3/uL (ref 150–400)
RBC: 4.65 MIL/uL (ref 4.22–5.81)
RDW: 13.8 % (ref 11.5–15.5)
WBC: 5.7 10*3/uL (ref 4.0–10.5)

## 2013-10-03 LAB — TSH: TSH: 1.75 u[IU]/mL (ref 0.350–4.500)

## 2013-10-03 LAB — COMPREHENSIVE METABOLIC PANEL
ALBUMIN: 3.3 g/dL — AB (ref 3.5–5.2)
ALT: 19 U/L (ref 0–53)
AST: 15 U/L (ref 0–37)
Alkaline Phosphatase: 49 U/L (ref 39–117)
BILIRUBIN TOTAL: 0.3 mg/dL (ref 0.3–1.2)
BUN: 16 mg/dL (ref 6–23)
CO2: 24 mEq/L (ref 19–32)
CREATININE: 0.9 mg/dL (ref 0.50–1.35)
Calcium: 8.6 mg/dL (ref 8.4–10.5)
Chloride: 107 mEq/L (ref 96–112)
GFR calc Af Amer: >90 mL/min (ref >90)
GFR calc non Af Amer: >90 mL/min (ref >90)
Glucose, Bld: 141 mg/dL — ABNORMAL HIGH (ref 70–99)
POTASSIUM: 3.8 meq/L (ref 3.7–5.3)
Sodium: 142 mEq/L (ref 137–147)
TOTAL PROTEIN: 6.1 g/dL (ref 6.0–8.3)

## 2013-10-03 LAB — HEMOGLOBIN A1C
HEMOGLOBIN A1C: 6.1 % — AB (ref <5.7)
MEAN PLASMA GLUCOSE: 128 mg/dL — AB (ref <117)

## 2013-10-03 LAB — PROTIME-INR
INR: 1 (ref 0.00–1.49)
Prothrombin Time: 13 seconds (ref 11.6–15.2)

## 2013-10-03 LAB — TROPONIN I: Troponin I: <0.3 ng/mL (ref <0.30)

## 2013-10-03 SURGERY — LEFT HEART CATHETERIZATION WITH CORONARY ANGIOGRAM
Anesthesia: LOCAL

## 2013-10-03 MED ORDER — ASPIRIN EC 325 MG PO TBEC
325.0000 mg | DELAYED_RELEASE_TABLET | Freq: Every day | ORAL | Status: DC
  Administered 2013-10-04: 325 mg via ORAL
  Filled 2013-10-03: qty 1

## 2013-10-03 MED ORDER — SODIUM CHLORIDE 0.9 % IV SOLN: 250.0000 mL | INTRAVENOUS | Status: DC | PRN

## 2013-10-03 MED ORDER — HEPARIN (PORCINE) IN NACL 2-0.9 UNIT/ML-% IJ SOLN
INTRAMUSCULAR | Status: AC
  Filled 2013-10-03: qty 500

## 2013-10-03 MED ORDER — SODIUM CHLORIDE 0.9 % IJ SOLN: 3.0000 mL | Freq: Two times a day (BID) | INTRAMUSCULAR | Status: DC

## 2013-10-03 MED ORDER — VERAPAMIL HCL 2.5 MG/ML IV SOLN
INTRAVENOUS | Status: AC
  Filled 2013-10-03: qty 2

## 2013-10-03 MED ORDER — SODIUM CHLORIDE 0.9 % IJ SOLN: 3.0000 mL | INTRAMUSCULAR | Status: DC | PRN

## 2013-10-03 MED ORDER — LIDOCAINE HCL (PF) 1 % IJ SOLN
INTRAMUSCULAR | Status: AC
  Filled 2013-10-03: qty 30

## 2013-10-03 MED ORDER — SODIUM CHLORIDE 0.9 % IV SOLN: INTRAVENOUS | Status: AC

## 2013-10-03 MED ORDER — SODIUM CHLORIDE 0.9 % IV SOLN
INTRAVENOUS | Status: DC
  Administered 2013-10-03: 09:00:00 via INTRAVENOUS

## 2013-10-03 MED ORDER — NITROGLYCERIN 0.2 MG/ML ON CALL CATH LAB
INTRAVENOUS | Status: AC
  Filled 2013-10-03: qty 1

## 2013-10-03 MED ORDER — FENTANYL CITRATE 0.05 MG/ML IJ SOLN
INTRAMUSCULAR | Status: AC
  Filled 2013-10-03: qty 2

## 2013-10-03 MED ORDER — ASPIRIN 81 MG PO CHEW
81.0000 mg | CHEWABLE_TABLET | ORAL | Status: AC
  Administered 2013-10-03: 81 mg via ORAL
  Filled 2013-10-03: qty 1

## 2013-10-03 MED ORDER — MIDAZOLAM HCL 2 MG/2ML IJ SOLN
INTRAMUSCULAR | Status: AC
Start: 2013-10-03 — End: 2013-10-03
  Filled 2013-10-03: qty 2

## 2013-10-03 MED ORDER — HEPARIN (PORCINE) IN NACL 2-0.9 UNIT/ML-% IJ SOLN
INTRAMUSCULAR | Status: AC
Start: 2013-10-03 — End: 2013-10-03
  Filled 2013-10-03: qty 1000

## 2013-10-03 MED ORDER — DIAZEPAM 5 MG PO TABS
5.0000 mg | ORAL_TABLET | ORAL | Status: AC
  Administered 2013-10-03: 5 mg via ORAL
  Filled 2013-10-03: qty 1

## 2013-10-03 MED ORDER — HEPARIN SODIUM (PORCINE) 1000 UNIT/ML IJ SOLN
INTRAMUSCULAR | Status: AC
  Filled 2013-10-03: qty 1

## 2013-10-03 NOTE — Progress Notes (Signed)
Patient ID: Lawrence Liu, male   DOB: 1955-08-06, 58 y.o.   MRN: [id number] Discussed with Dr Ron Parker and PA Will try to do cardiac MRI with aortic MRA in am but already 3 MRI's scehduled Spoke with techs to expidite Will use IIR in axial plane and Fiesta in coronal  And candycane view to assess  Order written   Jenkins Rouge

## 2013-10-03 NOTE — Progress Notes (Addendum)
Patient ID: Lawrence Liu, male   DOB: Jul 06, 1956, 58 y.o.   MRN: [id number]    SUBJECTIVE:   Patient is stable this morning. Cardiac enzymes are negative. He has a significant family history of coronary disease. His exercise tolerance has been limited by shortness of breath recently. His overall symptoms are concerning. I have discussed the situation with him and we both agree that we should proceed with diagnostic cardiac catheterization today. I have spoken with Dr. Fletcher Anon about him.   Filed Vitals:   10/02/13 1900 10/02/13 1945 10/02/13 2049 10/03/13 0534  BP: 127/85 137/71 117/79 117/70  Pulse: 78 86 74 67  Temp:   97.7 F (36.5 C) 98.1 F (36.7 C)  TempSrc:   Oral Oral  Resp: 15 21 16 16   Height:   6' (1.829 m)   Weight:   210 lb (95.255 kg)   SpO2: 99% 97% 97% 97%     Intake/Output Summary (Last 24 hours) at 10/03/13 0835 Last data filed at 10/02/13 2206  Gross per 24 hour  Intake      3 ml  Output      0 ml  Net      3 ml    LABS: Basic Metabolic Panel:  Recent Labs  10/02/13 1704 10/02/13 1941 10/03/13 0124  NA 144  --  142  K 4.0  --  3.8  CL 109  --  107  CO2 24  --  24  GLUCOSE 137*  --  141*  BUN 17  --  16  CREATININE 0.92 0.84 0.90  CALCIUM 8.4  --  8.6   Liver Function Tests:  Recent Labs  10/03/13 0124  AST 15  ALT 19  ALKPHOS 49  BILITOT 0.3  PROT 6.1  ALBUMIN 3.3*   No results found for this basename: LIPASE, AMYLASE,  in the last 72 hours CBC:  Recent Labs  10/02/13 1941 10/03/13 0124  WBC 6.5 5.7  NEUTROABS  --  2.8  HGB 13.5 12.4*  HCT 39.7 36.6*  MCV 78.5 78.7  PLT 191 163   Cardiac Enzymes:  Recent Labs  10/02/13 1941 10/03/13 0124  TROPONINI <0.30 <0.30   BNP: No components found with this basename: POCBNP,  D-Dimer:  Recent Labs  10/02/13 1726  DDIMER 0.33   Hemoglobin A1C:  Recent Labs  10/02/13 1941  HGBA1C 6.1*   Fasting Lipid Panel:  Recent Labs  10/02/13 1941  CHOL 117  HDL 39*    LDLCALC 56  TRIG 112  CHOLHDL 3.0   Thyroid Function Tests:  Recent Labs  10/02/13 1941  TSH 1.750    RADIOLOGY: Dg Chest Port 1 View  10/02/2013   CLINICAL DATA:  Chest pain and cough  EXAM: PORTABLE CHEST - 1 VIEW  COMPARISON:  07/07/2011  FINDINGS: The heart size and mediastinal contours are within normal limits. Both lungs are clear. The visualized skeletal structures are unremarkable.  IMPRESSION: No active disease.   Electronically Signed   By: Daryll Brod M.D.   On: 10/02/2013 17:09    PHYSICAL EXAM     Patient is oriented to person time and place. Affect is normal. There is no jugulovenous distention. Lungs are clear. Respiratory effort is nonlabored. Cardiac exam reveals S1 and S2. There no clicks or significant murmurs. Abdomen is soft. There is no peripheral edema.  EKG   EKG today shows some decrease anterior R wave progression. Some of this may be due to lead placement. There is  no new diagnostic change.  Telemetry    I have reviewed telemetry today October 03, 2013. There is normal sinus rhythm. There is an area of artifact that does not represent supraventricular tachycardia. There are scattered PVCs. There are no couplets or evidence of ventricular tachycardia.    ASSESSMENT AND PLAN:    Chest tightness      We will proceed with cardiac catheterization today. The cath lab is aware. Orders are written    PVC's (premature ventricular contractions)     The patient does tend to sense his PVCs. Monitor review reveals only scattered PVCs since last night. There is no evidence of ventricular tachycardia.    History of DVT (deep vein thrombosis)     The patient had deep venous thrombosis as a limited episode in the past. There was no pulmonary embolus.   Fayrene Towner 10/03/2013 8:35 AM  

## 2013-10-03 NOTE — Progress Notes (Signed)
TR band removed without immediate complications. VSS. Dorna Bloom, RN

## 2013-10-03 NOTE — Interval H&P Note (Signed)
History and Physical Interval Note:  10/03/2013 10:18 AM  Lawrence Liu  has presented today for surgery, with the diagnosis of cp  The various methods of treatment have been discussed with the patient and family. After consideration of risks, benefits and other options for treatment, the patient has consented to  Procedure(s): LEFT HEART CATHETERIZATION WITH CORONARY ANGIOGRAM (N/A) AND POSSIBLE ANGIOPLASTYCath Lab Visit (complete for each Cath Lab visit)  Clinical Evaluation Leading to the Procedure:   ACS: yes  Non-ACS:    Anginal Classification: CCS IV  Anti-ischemic medical therapy: Maximal Therapy (2 or more classes of medications)  Non-Invasive Test Results: No non-invasive testing performed  Prior CABG: No previous CABG       as a surgical intervention .  The patient's history has been reviewed, patient examined, no change in status, stable for surgery.  I have reviewed the patient's chart and labs.  Questions were answered to the patient's satisfaction.     Lawrence Liu

## 2013-10-03 NOTE — Progress Notes (Signed)
UR completed 

## 2013-10-03 NOTE — CV Procedure (Signed)
Cardiac Cath Procedure Note:  Indication: [place]  Procedures performed:  1) Selective coronary angiography 2) Left heart catheterization 3) Left ventriculogram  Description of procedure:   The risks and indication of the procedure were explained. Consent was signed and placed on the chart. An appropriate timeout was taken prior to the procedure. After a normal Allen's test was confirmed, the right wrist was prepped and draped in the routine sterile fashion and anesthetized with 1% local lidocaine.   A 5 FR arterial sheath was then placed in the right radial artery using a modified Seldinger technique. Systemic heparin was administered. 3mg  IV verapamil was given through the sheath. Standard catheters including a JL 3.5, JR4 and straight pigtail were used. All catheter exchanges were made over a wire.  Complications:  None apparent  Total contrast: 60cc  Findings:  Ao Pressure: 114/75 (94) LV Pressure: 114/13/17 There was no signficant gradient across the aortic valve on pullback.  Left main: Normal  LAD: Diffuse 20-30% plaque throughout. Distal vessel is small. No high-grade stenosis.  LCX: Ostial 30-40% lesion. Large OM-1 with 30% mid. Small OM-2 and several small PLs which are normal.  RCA: Dominant. Large vessel. 30% mid. Diffuse 20-30% distally.   LV-gram done in the RAO projection: Ejection fraction = 55-60% no wall motion abnormalities. Ao root appears mildly dilated.   Assessment: 1. Diffuse mild non-obstructive CAD 2. Normal LV function 3. ? Mild Ao root dilation  Plan/Discussion:  He has mild diffuse CAD but no high-grade lesions. Will need aggressive risk factor management with ASA, statin, diet and exercise. Will check echo to assess Ao root size. Can go home later today.   Daniel Bensimhon,MD 10:58 AM

## 2013-10-03 NOTE — Progress Notes (Signed)
Dr. Ron Parker called to request this patient get MRI to assess aorta this evening as echo has been down. D/w Dr. Johnsie Cancel - will need cardiac MRI with aorta to assess. He will assist with orders and scheduling. Primary team & patient made aware. Dayna Dunn PA-C

## 2013-10-03 NOTE — Progress Notes (Signed)
Echocardiogram 2D Echocardiogram has been performed.  Lawrence Liu 10/03/2013, 1:47 PM

## 2013-10-03 NOTE — Progress Notes (Signed)
[facility] PROGRESS NOTE  Lawrence Liu IPJ:[id number] DOB: 1956/02/12 DOA: 10/02/2013 PCP: Jenna Luo TOM, MD  Assessment/Plan: #1 chest pain/chest tightness Questionable etiology.cardiac enzymes are negative x3. Lower extremity Dopplers are negative. 2-D echo is pending. Patient currently chest pain-free. Patient status post cardiac catheterization 03/04/2015with diffuse mild nonobstructive coronary artery disease normal LV function and question of mild aortic root dilatation.  Patient with mild diffuse coronary artery disease but no high-grade lesions. 2-D echo pending. Continue benazepril, aspirin, Toprol.Patient for cardiac MRI tomorrow per cardiology. Cardiology following and appreciate input and recommendations.  #2hypertension Stable. Continue Norvasc, benazepril, Toprol.   #3 gastroesophageal reflux disease PPI.  #4 prophylaxis Heparin for DVT prophylaxis.  Code Status: full Family Communication: updated the patient and family at bedside. Disposition Plan: home when medically stable.   Consultants:  Cardiology:Dr. Ron Parker 10/02/2013  Procedures:  Cardiac catheterization 10/03/2013  2-D echo 10/03/2013  Antibiotics:  none  HPI/Subjective: Patient denies any active chest pain. Patient awaiting cardiac catheterization.  Objective: Filed Vitals:   10/03/13 1431  BP: 116/80  Pulse: 77  Temp: 97.4 F (36.3 C)  Resp: 16    Intake/Output Summary (Last 24 hours) at 10/03/13 1642 Last data filed at 10/03/13 1300  Gross per 24 hour  Intake    303 ml  Output      0 ml  Net    303 ml   Filed Weights   10/02/13 1648 10/02/13 2049  Weight: 95.255 kg (210 lb) 95.255 kg (210 lb)    Exam:   General:  NAD  Cardiovascular: RRR  Respiratory: CTAB  Abdomen: soft, nontender, nondistended, positive bowel sounds  Musculoskeletal: no clubbing cyanosis or edema  Data Reviewed: Basic Metabolic Panel:  Recent Labs Lab 10/02/13 1704 10/02/13 1941  10/03/13 0124  NA 144  --  142  K 4.0  --  3.8  CL 109  --  107  CO2 24  --  24  GLUCOSE 137*  --  141*  BUN 17  --  16  CREATININE 0.92 0.84 0.90  CALCIUM 8.4  --  8.6   Liver Function Tests:  Recent Labs Lab 10/03/13 0124  AST 15  ALT 19  ALKPHOS 49  BILITOT 0.3  PROT 6.1  ALBUMIN 3.3*   No results found for this basename: LIPASE, AMYLASE,  in the last 168 hours No results found for this basename: AMMONIA,  in the last 168 hours CBC:  Recent Labs Lab 10/02/13 1704 10/02/13 1941 10/03/13 0124  WBC 7.0 6.5 5.7  NEUTROABS  --   --  2.8  HGB 13.3 13.5 12.4*  HCT 38.9* 39.7 36.6*  MCV 78.6 78.5 78.7  PLT 189 191 163   Cardiac Enzymes:  Recent Labs Lab 10/02/13 1941 10/03/13 0124 10/03/13 0700  TROPONINI <0.30 <0.30 <0.30   BNP (last 3 results)  Recent Labs  10/02/13 1726  PROBNP 12.8   CBG: No results found for this basename: GLUCAP,  in the last 168 hours  No results found for this or any previous visit (from the past 240 hour(s)).   Studies: Dg Chest Port 1 View  10/02/2013   CLINICAL DATA:  Chest pain and cough  EXAM: PORTABLE CHEST - 1 VIEW  COMPARISON:  07/07/2011  FINDINGS: The heart size and mediastinal contours are within normal limits. Both lungs are clear. The visualized skeletal structures are unremarkable.  IMPRESSION: No active disease.   Electronically Signed   By: Daryll Brod M.D.   On: 10/02/2013 17:09  Scheduled Meds: . amLODipine  5 mg Oral Daily   And  . benazepril  20 mg Oral Daily  . [START ON 10/04/2013] aspirin EC  325 mg Oral Daily  . heparin  5,000 Units Subcutaneous 3 times per day  . metoprolol succinate  25 mg Oral Daily  . pantoprazole  40 mg Oral Daily  . sodium chloride  3 mL Intravenous Q12H   Continuous Infusions:   Principal Problem:   Chest pain Active Problems:   GERD (gastroesophageal reflux disease)   Chest tightness   PVC's (premature ventricular contractions)   History of DVT (deep vein  thrombosis)    Time spent: 35 minutes    [facility] MD [facility] Pager [phone]. If 7PM-7AM, please contact night-coverage at [email or web address], password [facility] 10/03/2013, 4:42 PM  LOS: 1 day

## 2013-10-03 NOTE — H&P (View-Only) (Signed)
Patient ID: Lawrence Liu, male   DOB: Jul 06, 1956, 58 y.o.   MRN: [id number]    SUBJECTIVE:   Patient is stable this morning. Cardiac enzymes are negative. He has a significant family history of coronary disease. His exercise tolerance has been limited by shortness of breath recently. His overall symptoms are concerning. I have discussed the situation with him and we both agree that we should proceed with diagnostic cardiac catheterization today. I have spoken with Dr. Fletcher Anon about him.   Filed Vitals:   10/02/13 1900 10/02/13 1945 10/02/13 2049 10/03/13 0534  BP: 127/85 137/71 117/79 117/70  Pulse: 78 86 74 67  Temp:   97.7 F (36.5 C) 98.1 F (36.7 C)  TempSrc:   Oral Oral  Resp: 15 21 16 16   Height:   6' (1.829 m)   Weight:   210 lb (95.255 kg)   SpO2: 99% 97% 97% 97%     Intake/Output Summary (Last 24 hours) at 10/03/13 0835 Last data filed at 10/02/13 2206  Gross per 24 hour  Intake      3 ml  Output      0 ml  Net      3 ml    LABS: Basic Metabolic Panel:  Recent Labs  10/02/13 1704 10/02/13 1941 10/03/13 0124  NA 144  --  142  K 4.0  --  3.8  CL 109  --  107  CO2 24  --  24  GLUCOSE 137*  --  141*  BUN 17  --  16  CREATININE 0.92 0.84 0.90  CALCIUM 8.4  --  8.6   Liver Function Tests:  Recent Labs  10/03/13 0124  AST 15  ALT 19  ALKPHOS 49  BILITOT 0.3  PROT 6.1  ALBUMIN 3.3*   No results found for this basename: LIPASE, AMYLASE,  in the last 72 hours CBC:  Recent Labs  10/02/13 1941 10/03/13 0124  WBC 6.5 5.7  NEUTROABS  --  2.8  HGB 13.5 12.4*  HCT 39.7 36.6*  MCV 78.5 78.7  PLT 191 163   Cardiac Enzymes:  Recent Labs  10/02/13 1941 10/03/13 0124  TROPONINI <0.30 <0.30   BNP: No components found with this basename: POCBNP,  D-Dimer:  Recent Labs  10/02/13 1726  DDIMER 0.33   Hemoglobin A1C:  Recent Labs  10/02/13 1941  HGBA1C 6.1*   Fasting Lipid Panel:  Recent Labs  10/02/13 1941  CHOL 117  HDL 39*    LDLCALC 56  TRIG 112  CHOLHDL 3.0   Thyroid Function Tests:  Recent Labs  10/02/13 1941  TSH 1.750    RADIOLOGY: Dg Chest Port 1 View  10/02/2013   CLINICAL DATA:  Chest pain and cough  EXAM: PORTABLE CHEST - 1 VIEW  COMPARISON:  07/07/2011  FINDINGS: The heart size and mediastinal contours are within normal limits. Both lungs are clear. The visualized skeletal structures are unremarkable.  IMPRESSION: No active disease.   Electronically Signed   By: Daryll Brod M.D.   On: 10/02/2013 17:09    PHYSICAL EXAM     Patient is oriented to person time and place. Affect is normal. There is no jugulovenous distention. Lungs are clear. Respiratory effort is nonlabored. Cardiac exam reveals S1 and S2. There no clicks or significant murmurs. Abdomen is soft. There is no peripheral edema.  EKG   EKG today shows some decrease anterior R wave progression. Some of this may be due to lead placement. There is  no new diagnostic change.  Telemetry    I have reviewed telemetry today October 03, 2013. There is normal sinus rhythm. There is an area of artifact that does not represent supraventricular tachycardia. There are scattered PVCs. There are no couplets or evidence of ventricular tachycardia.    ASSESSMENT AND PLAN:    Chest tightness      We will proceed with cardiac catheterization today. The cath lab is aware. Orders are written    PVC's (premature ventricular contractions)     The patient does tend to sense his PVCs. Monitor review reveals only scattered PVCs since last night. There is no evidence of ventricular tachycardia.    History of DVT (deep vein thrombosis)     The patient had deep venous thrombosis as a limited episode in the past. There was no pulmonary embolus.   Dola Argyle 10/03/2013 8:35 AM

## 2013-10-04 ENCOUNTER — Observation Stay (HOSPITAL_COMMUNITY): Payer: 59

## 2013-10-04 DIAGNOSIS — I4949 Other premature depolarization: Secondary | ICD-10-CM

## 2013-10-04 DIAGNOSIS — R079 Chest pain, unspecified: Secondary | ICD-10-CM

## 2013-10-04 LAB — CBC WITH DIFFERENTIAL/PLATELET
BASOS PCT: 0 % (ref 0–1)
Basophils Absolute: 0 10*3/uL (ref 0.0–0.1)
Eosinophils Absolute: 0.2 10*3/uL (ref 0.0–0.7)
Eosinophils Relative: 4 % (ref 0–5)
HEMATOCRIT: 37.2 % — AB (ref 39.0–52.0)
HEMOGLOBIN: 12.2 g/dL — AB (ref 13.0–17.0)
Lymphocytes Relative: 43 % (ref 12–46)
Lymphs Abs: 2.3 10*3/uL (ref 0.7–4.0)
MCH: 26.2 pg (ref 26.0–34.0)
MCHC: 32.8 g/dL (ref 30.0–36.0)
MCV: 79.8 fL (ref 78.0–100.0)
MONOS PCT: 7 % (ref 3–12)
Monocytes Absolute: 0.4 10*3/uL (ref 0.1–1.0)
NEUTROS ABS: 2.5 10*3/uL (ref 1.7–7.7)
Neutrophils Relative %: 46 % (ref 43–77)
Platelets: 169 10*3/uL (ref 150–400)
RBC: 4.66 MIL/uL (ref 4.22–5.81)
RDW: 13.9 % (ref 11.5–15.5)
WBC: 5.4 10*3/uL (ref 4.0–10.5)

## 2013-10-04 LAB — COMPREHENSIVE METABOLIC PANEL
ALT: 19 U/L (ref 0–53)
AST: 14 U/L (ref 0–37)
Albumin: 3.3 g/dL — ABNORMAL LOW (ref 3.5–5.2)
Alkaline Phosphatase: 48 U/L (ref 39–117)
BUN: 18 mg/dL (ref 6–23)
CO2: 24 mEq/L (ref 19–32)
Calcium: 8.8 mg/dL (ref 8.4–10.5)
Chloride: 105 mEq/L (ref 96–112)
Creatinine, Ser: 0.91 mg/dL (ref 0.50–1.35)
GFR calc Af Amer: >90 mL/min (ref >90)
GFR calc non Af Amer: >90 mL/min (ref >90)
Glucose, Bld: 96 mg/dL (ref 70–99)
Potassium: 4.1 mEq/L (ref 3.7–5.3)
SODIUM: 141 meq/L (ref 137–147)
TOTAL PROTEIN: 6.1 g/dL (ref 6.0–8.3)
Total Bilirubin: 0.2 mg/dL — ABNORMAL LOW (ref 0.3–1.2)

## 2013-10-04 MED ORDER — GADOBENATE DIMEGLUMINE 529 MG/ML IV SOLN
30.0000 mL | Freq: Once | INTRAVENOUS | Status: AC
  Administered 2013-10-04: 30 mL via INTRAVENOUS

## 2013-10-04 NOTE — H&P (Signed)
Physician Discharge Summary  Lawrence Liu PPJ:[id number] DOB: 06-Mar-1956 DOA: 10/02/2013  PCP: Odette Fraction, MD  Admit date: 10/02/2013 Discharge date: 10/04/2013  Time spent: 50* minutes  Recommendations for Outpatient Follow-up:  1. *Follow up Cardiology in one week 2. Follow up PCP in 2 weeks  Discharge Diagnoses:  Principal Problem:   Chest pain Active Problems:   GERD (gastroesophageal reflux disease)   Chest tightness   PVC's (premature ventricular contractions)   History of DVT (deep vein thrombosis)   Discharge Condition: Stable  Diet recommendation: Low salt diet  Filed Weights   10/02/13 1648 10/02/13 2049  Weight: 95.255 kg (210 lb) 95.255 kg (210 lb)    History of present illness:  58 y.o. year old male with prior hx/o HTN and remote hx/o DVT presenting with chest pain/discomfort. Pt works with [employer]. Pt states that he has had intermittent chest discomfort over the past year. Was seen by cardiologist Dr. Mare Ferrari about this. Was diagnosed with PVCs and started on metoprolol last week. Pt states that he has had progressive episode chest pain/discomfort over the past week. Chest pain worse with rest as well as exertion. CP is central with radiation to the back. No assd nausea or diaphoresis. Pain is sometimes worse with movement. Fairly constant. Has sensation of dyspnea assd with chest discomfort. CP seems to improve with cough. Also with generalized fatigue over this time frame. Had episode of CP that woke pt up from sleep this am. Pain was 8/10 at the time. Went to firehouse around corner from his house was brought to hospital via EMS. Received SL NTG x 1 w/ improvement in CP.  In the ER, workup has been essentially WNL. CXR, EKG, trops, d-dimer all within normal limits   Hospital Course:  #1 chest pain/chest tightness  Questionable etiology.cardiac enzymes are negative x3. Lower extremity Dopplers are negative. 2-D echo showed EF 60%, with aortic root  dilation,  Patient currently chest pain-free. Patient status post cardiac catheterization 03/04/2015with diffuse mild nonobstructive coronary artery disease normal LV function and question of mild aortic root dilatation. Patient with mild diffuse coronary artery disease but no high-grade lesions. 2-D echo pending. Continue benazepril, aspirin, Toprol. Chest piain is completely resolved, likely GI related. 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 Please see MRI for final results, as final report is pending.  #2hypertension  Stable. Continue Norvasc, benazepril, Toprol.   #3 gastroesophageal reflux disease  PPI.     Procedures:  Cardiac cath showed mild nonobstructive CAD  echocardiogram  Consultations:  cardiology  Discharge Exam: Filed Vitals:   10/04/13 1655  BP: 131/71  Pulse: 70  Temp: 97.6 F (36.4 C)  Resp: 18    General: Appear in no acute distress Cardiovascular: s1s2 RRR Respiratory: Clear bilaterally   Discharge Instructions  Discharge Orders   Future Appointments Provider Department Dept Phone   11/06/2013 3:30 PM Darlin Coco, MD [place] Office [phone]   Future Orders Complete By Expires   Diet - low sodium heart healthy  As directed    Discharge instructions  As directed    Comments:     Patient was admitted to the hospital from 10/02/13- 10/04/13, he will need rest for two days and can  return to work on 10/07/13   Increase activity slowly  As directed        Medication List         amLODipine-benazepril 5-20 MG per capsule  Commonly known as:  LOTREL  Take 1 capsule by mouth daily.     aspirin EC 81 MG tablet  Take 81 mg by mouth daily.     metoprolol succinate 25 MG 24 hr tablet  Commonly known as:   TOPROL-XL  Take 1 tablet (25 mg total) by mouth daily.     pantoprazole 40 MG tablet  Commonly known as:  PROTONIX  Take 40 mg by mouth daily.     tadalafil 20 MG tablet  Commonly known as:  CIALIS  Take 1 tablet (20 mg total) by mouth daily as needed for erectile dysfunction.       Allergies  Allergen Reactions  . Latex Anaphylaxis  . Penicillins   . Sulfa Antibiotics Other (See Comments)    Hives...only when mixed sulfa and pencillin together       Follow-up Information   Follow up with Dola Argyle, MD. Schedule an appointment as soon as possible for a visit in 1 week.   Specialty:  Cardiology   Contact information:   [address] [place] [address] [phone]        The results of significant diagnostics from this hospitalization (including imaging, microbiology, ancillary and laboratory) are listed below for reference.    Significant Diagnostic Studies: Dg Chest Port 1 View  10/02/2013   CLINICAL DATA:  Chest pain and cough  EXAM: PORTABLE CHEST - 1 VIEW  COMPARISON:  07/07/2011  FINDINGS: The heart size and mediastinal contours are within normal limits. Both lungs are clear. The visualized skeletal structures are unremarkable.  IMPRESSION: No active disease.   Electronically Signed   By: Daryll Brod M.D.   On: 10/02/2013 17:09    Microbiology: No results found for this or any previous visit (from the past 240 hour(s)).   Labs: Basic Metabolic Panel:  Recent Labs Lab 10/02/13 1704 10/02/13 1941 10/03/13 0124 10/04/13 0342  NA 144  --  142 141  K 4.0  --  3.8 4.1  CL 109  --  107 105  CO2 24  --  24 24  GLUCOSE 137*  --  141* 96  BUN 17  --  16 18  CREATININE 0.92 0.84 0.90 0.91  CALCIUM 8.4  --  8.6 8.8   Liver Function Tests:  Recent Labs Lab 10/03/13 0124 10/04/13 0342  AST 15 14  ALT 19 19  ALKPHOS 49 48  BILITOT 0.3 0.2*  PROT 6.1 6.1  ALBUMIN 3.3* 3.3*   No results found for this basename: LIPASE, AMYLASE,   in the last 168 hours No results found for this basename: AMMONIA,  in the last 168 hours CBC:  Recent Labs Lab 10/02/13 1704 10/02/13 1941 10/03/13 0124 10/04/13 0342  WBC 7.0 6.5 5.7 5.4  NEUTROABS  --   --  2.8 2.5  HGB 13.3 13.5 12.4* 12.2*  HCT 38.9* 39.7 36.6* 37.2*  MCV 78.6 78.5 78.7 79.8  PLT 189 191 163 169   Cardiac Enzymes:  Recent Labs Lab 10/02/13 1941 10/03/13 0124 10/03/13 0700  TROPONINI <0.30 <0.30 <0.30   BNP: BNP (last 3 results)  Recent Labs  10/02/13 1726  PROBNP 12.8   CBG: No results found for this basename: GLUCAP,  in the last 168 hours  SignedEleonore Chiquito S  [facility] 10/04/2013, 5:45 PM

## 2013-10-04 NOTE — Progress Notes (Signed)
Patient: Lawrence Liu / Admit Date: 10/02/2013 / Date of Encounter: 10/04/2013, 12:17 PM   Subjective  Feels well. No exertional sx. No CP. PVCs have quieted down.  Reports lots of stress at work recently at [employer]  due to decreased staffing. Working 60+ hours per week.  Objective   Telemetry: NSR occasional PVC, couplets.  Physical Exam: Blood pressure 118/81, pulse 72, temperature 98.5 F (36.9 C), temperature source Oral, resp. rate 18, height 6' (1.829 m), weight 210 lb (95.255 kg), SpO2 96.00%. General: Well developed, well nourished WM in no acute distress. Head: Normocephalic, atraumatic, sclera non-icteric, no xanthomas, nares are without discharge. Neck: Negative for carotid bruits. JVP not elevated. Lungs: Clear bilaterally to auscultation without wheezes, rales, or rhonchi. Breathing is unlabored. Heart: RRR S1 S2 without murmurs, rubs, or gallops.  Abdomen: Soft, non-tender, non-distended with normoactive bowel sounds. No rebound/guarding. Extremities: No clubbing or cyanosis. No edema. Distal pedal pulses are 2+ and equal bilaterally. R radial site without complication Neuro: Alert and oriented X 3. Moves all extremities spontaneously. Psych:  Responds to questions appropriately with a normal affect.   Intake/Output Summary (Last 24 hours) at 10/04/13 1217 Last data filed at 10/03/13 2117  Gross per 24 hour  Intake    303 ml  Output      0 ml  Net    303 ml    Inpatient Medications:  . amLODipine  5 mg Oral Daily   And  . benazepril  20 mg Oral Daily  . aspirin EC  325 mg Oral Daily  . heparin  5,000 Units Subcutaneous 3 times per day  . metoprolol succinate  25 mg Oral Daily  . pantoprazole  40 mg Oral Daily  . sodium chloride  3 mL Intravenous Q12H   Infusions:    Labs:  Recent Labs  10/03/13 0124 10/04/13 0342  NA 142 141  K 3.8 4.1  CL 107 105  CO2 24 24  GLUCOSE 141* 96  BUN 16 18  CREATININE 0.90 0.91  CALCIUM 8.6 8.8    Recent Labs  10/03/13 0124 10/04/13 0342  AST 15 14  ALT 19 19  ALKPHOS 49 48  BILITOT 0.3 0.2*  PROT 6.1 6.1  ALBUMIN 3.3* 3.3*    Recent Labs  10/03/13 0124 10/04/13 0342  WBC 5.7 5.4  NEUTROABS 2.8 2.5  HGB 12.4* 12.2*  HCT 36.6* 37.2*  MCV 78.7 79.8  PLT 163 169    Recent Labs  10/02/13 1941 10/03/13 0124 10/03/13 0700  TROPONINI <0.30 <0.30 <0.30   No components found with this basename: POCBNP,   Recent Labs  10/02/13 1941  HGBA1C 6.1*     Radiology/Studies:  Dg Chest Port 1 View  10/02/2013   CLINICAL DATA:  Chest pain and cough  EXAM: PORTABLE CHEST - 1 VIEW  COMPARISON:  07/07/2011  FINDINGS: The heart size and mediastinal contours are within normal limits. Both lungs are clear. The visualized skeletal structures are unremarkable.  IMPRESSION: No active disease.   Electronically Signed   By: Daryll Brod M.D.   On: 10/02/2013 17:09     Assessment and Plan  1. Chest tightness, resolved. Recent exertional fatigue 2. Occasional PVCs 3. 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 H/o DVT in the past 5. GERD 6. HTN, controlled  Cath without significant CAD. Not tachycardic, tachypnic or hypoxic and d-dimer negative. Cardiac MRI today to assess aortic root dilitation (see Dr.  Nishan's note). If this is OK and not felt to be contributing to sx, one thought to consider would be maybe switching to a different beta blocker or lowering dose to see if it helps with his exertional fatigue. Will need to determine when he can return to work after MRI comes back.  Signed, Melina Copa PA-C  Personally seen and examined. Agree with above.  Dr. Johnsie Cancel notified Dr. Darrick Meigs that it was OK from MRI perspective to send him home.  Cath reassuring.  Please see MRI report (to be dictated) for detailed measure. Can go back to work Sunday.  Try to avoid heavy lifting over the next 3-5 days.  Works for [employer] .  Candee Furbish, MD

## 2013-10-04 NOTE — Progress Notes (Signed)
Reviewed discharge instructions with patient and wife, they stated their understanding.  Radial site care instructions also reviewed with patient and wife stating their understanding.  Discharged home with wife.  Sanda Linger

## 2013-10-04 NOTE — Discharge Instructions (Signed)
Chest Pain (Nonspecific) °It is often hard to give a specific diagnosis for the cause of chest pain. There is always a chance that your pain could be related to something serious, such as a heart attack or a blood clot in the lungs. You need to follow up with your caregiver for further evaluation. °CAUSES  °· Heartburn. °· Pneumonia or bronchitis. °· Anxiety or stress. °· Inflammation around your heart (pericarditis) or lung (pleuritis or pleurisy). °· A blood clot in the lung. °· A collapsed lung (pneumothorax). It can develop suddenly on its own (spontaneous pneumothorax) or from injury (trauma) to the chest. °· Shingles infection (herpes zoster virus). °The chest wall is composed of bones, muscles, and cartilage. Any of these can be the source of the pain. °· The bones can be bruised by injury. °· The muscles or cartilage can be strained by coughing or overwork. °· The cartilage can be affected by inflammation and become sore (costochondritis). °DIAGNOSIS  °Lab tests or other studies, such as X-rays, electrocardiography, stress testing, or cardiac imaging, may be needed to find the cause of your pain.  °TREATMENT  °· Treatment depends on what may be causing your chest pain. Treatment may include: °· Acid blockers for heartburn. °· Anti-inflammatory medicine. °· Pain medicine for inflammatory conditions. °· Antibiotics if an infection is present. °· You may be advised to change lifestyle habits. This includes stopping smoking and avoiding alcohol, caffeine, and chocolate. °· You may be advised to keep your head raised (elevated) when sleeping. This reduces the chance of acid going backward from your stomach into your esophagus. °· Most of the time, nonspecific chest pain will improve within 2 to 3 days with rest and mild pain medicine. °HOME CARE INSTRUCTIONS  °· If antibiotics were prescribed, take your antibiotics as directed. Finish them even if you start to feel better. °· For the next few days, avoid physical  activities that bring on chest pain. Continue physical activities as directed. °· Do not smoke. °· Avoid drinking alcohol. °· Only take over-the-counter or prescription medicine for pain, discomfort, or fever as directed by your caregiver. °· Follow your caregiver's suggestions for further testing if your chest pain does not go away. °· Keep any follow-up appointments you made. If you do not go to an appointment, you could develop lasting (chronic) problems with pain. If there is any problem keeping an appointment, you must call to reschedule. °SEEK MEDICAL CARE IF:  °· You think you are having problems from the medicine you are taking. Read your medicine instructions carefully. °· Your chest pain does not go away, even after treatment. °· You develop a rash with blisters on your chest. °SEEK IMMEDIATE MEDICAL CARE IF:  °· You have increased chest pain or pain that spreads to your arm, neck, jaw, back, or abdomen. °· You develop shortness of breath, an increasing cough, or you are coughing up blood. °· You have severe back or abdominal pain, feel nauseous, or vomit. °· You develop severe weakness, fainting, or chills. °· You have a fever. °THIS IS AN EMERGENCY. Do not wait to see if the pain will go away. Get medical help at once. Call your local emergency services (911 in [place] Do not drive yourself to the hospital. °MAKE SURE YOU:  °· Understand these instructions. °· Will watch your condition. °· Will get help right away if you are not doing well or get worse. °Document Released: 04/28/2005 Document Revised: 10/11/2011 Document Reviewed: 02/22/2008 °ExitCare® Patient Information ©2014 [employer]   [employer]. ° °

## 2013-10-05 ENCOUNTER — Encounter: Payer: Self-pay | Admitting: Cardiology

## 2013-10-05 NOTE — Progress Notes (Signed)
The patient has received excellent care in the hospital and has been discharged. I have reviewed the discharge summary. I've spoken with Dr. Mare Ferrari who follows the patient. We will be sure that appropriate post hospital followup is done with Dr. Mare Ferrari.  Daryel November, MD

## 2013-10-11 ENCOUNTER — Encounter: Payer: Self-pay | Admitting: Family Medicine

## 2013-10-16 ENCOUNTER — Ambulatory Visit (INDEPENDENT_AMBULATORY_CARE_PROVIDER_SITE_OTHER): Payer: 59 | Admitting: Family Medicine

## 2013-10-16 ENCOUNTER — Encounter: Payer: Self-pay | Admitting: Family Medicine

## 2013-10-16 ENCOUNTER — Inpatient Hospital Stay: Payer: 59 | Admitting: Family Medicine

## 2013-10-16 VITALS — BP 110/76 | HR 78 | Temp 98.3°F | Resp 14 | Ht 72.0 in | Wt 214.0 lb

## 2013-10-16 DIAGNOSIS — I7121 Aneurysm of the ascending aorta, without rupture: Secondary | ICD-10-CM

## 2013-10-16 DIAGNOSIS — I719 Aortic aneurysm of unspecified site, without rupture: Secondary | ICD-10-CM

## 2013-10-16 DIAGNOSIS — Z09 Encounter for follow-up examination after completed treatment for conditions other than malignant neoplasm: Secondary | ICD-10-CM

## 2013-10-16 NOTE — Discharge Summary (Signed)
PCP: Odette Fraction, MD  Admit date: 10/02/2013  Discharge date: 10/04/2013  Time spent: 50* minutes  Recommendations for Outpatient Follow-up:  1. *Follow up Cardiology in one week 2. Follow up PCP in 2 weeks Discharge Diagnoses:  Principal Problem:  Chest pain  Active Problems:  GERD (gastroesophageal reflux disease)  Chest tightness  PVC's (premature ventricular contractions)  History of DVT (deep vein thrombosis)   Discharge Condition: Stable  Diet recommendation: Low salt diet  Filed Weights    10/02/13 1648  10/02/13 2049   Weight:  95.255 kg (210 lb)  95.255 kg (210 lb)    History of present illness:  58 y.o. year old male with prior hx/o HTN and remote hx/o DVT presenting with chest pain/discomfort. Pt works with [employer]. Pt states that he has had intermittent chest discomfort over the past year. Was seen by cardiologist Dr. Mare Ferrari about this. Was diagnosed with PVCs and started on metoprolol last week. Pt states that he has had progressive episode chest pain/discomfort over the past week. Chest pain worse with rest as well as exertion. CP is central with radiation to the back. No assd nausea or diaphoresis. Pain is sometimes worse with movement. Fairly constant. Has sensation of dyspnea assd with chest discomfort. CP seems to improve with cough. Also with generalized fatigue over this time frame. Had episode of CP that woke pt up from sleep this am. Pain was 8/10 at the time. Went to firehouse around corner from his house was brought to hospital via EMS. Received SL NTG x 1 w/ improvement in CP.  In the ER, workup has been essentially WNL. CXR, EKG, trops, d-dimer all within normal limits  Hospital Course:  #1 chest pain/chest tightness  Questionable etiology.cardiac enzymes are negative x3. Lower extremity Dopplers are negative. 2-D echo showed EF 60%, with aortic root dilation, Patient currently chest pain-free. Patient status post cardiac catheterization 03/04/2015with  diffuse mild nonobstructive coronary artery disease normal LV function and question of mild aortic root dilatation. Patient with mild diffuse coronary artery disease but no high-grade lesions. 2-D echo pending. Continue benazepril, aspirin, Toprol. Chest piain is completely resolved, likely GI related.  Patient went for cardiac MRI , called and discussed with Dr Johnsie Cancel, who says that patient does have dilation of aortic root as per his evaluation of MRI, no further interventions at this time, He will follow up with Dr Ron Parker in one week to discuss the MRI results. Please see MRI for final results, as final report is pending.  #2hypertension  Stable. Continue Norvasc, benazepril, Toprol.  #3 gastroesophageal reflux disease  PPI.    Procedures:  Cardiac cath showed mild nonobstructive CAD  echocardiogram Consultations:  cardiology Discharge Exam:  Filed Vitals:    10/04/13 1655   BP:  131/71   Pulse:  70   Temp:  97.6 F (36.4 C)   Resp:  18    General: Appear in no acute distress  Cardiovascular: s1s2 RRR  Respiratory: Clear bilaterally  Discharge Instructions  Discharge Orders    Future Appointments  Provider  Department  Dept Phone    11/06/2013 3:30 PM  Darlin Coco, MD  [place] Office  [phone]    Future Orders  Complete By  Expires    Diet - low sodium heart healthy  As directed     Discharge instructions  As directed     Comments:    Patient was admitted to the hospital from 10/02/13- 10/04/13, he will need rest for  two days and can return to work on 10/07/13    Increase activity slowly  As directed         Medication List         amLODipine-benazepril 5-20 MG per capsule    Commonly known as: LOTREL    Take 1 capsule by mouth daily.    aspirin EC 81 MG tablet    Take 81 mg by mouth daily.    metoprolol succinate 25 MG 24 hr tablet    Commonly known as: TOPROL-XL    Take 1 tablet (25 mg total) by mouth daily.    pantoprazole 40 MG tablet     Commonly known as: PROTONIX    Take 40 mg by mouth daily.    tadalafil 20 MG tablet    Commonly known as: CIALIS    Take 1 tablet (20 mg total) by mouth daily as needed for erectile dysfunction.      Allergies   Allergen  Reactions   .  Latex  Anaphylaxis   .  Penicillins    .  Sulfa Antibiotics  Other (See Comments)     Hives...only when mixed sulfa and pencillin together        Follow-up Information    Follow up with Dola Argyle, MD. Schedule an appointment as soon as possible for a visit in 1 week.    Specialty: Cardiology    Contact information:    [address]  [place] [address]  [phone]       The results of significant diagnostics from this hospitalization (including imaging, microbiology, ancillary and laboratory) are listed below for reference.   Significant Diagnostic Studies:  Dg Chest Port 1 View  10/02/2013 CLINICAL DATA: Chest pain and cough EXAM: PORTABLE CHEST - 1 VIEW COMPARISON: 07/07/2011 FINDINGS: The heart size and mediastinal contours are within normal limits. Both lungs are clear. The visualized skeletal structures are unremarkable. IMPRESSION: No active disease. Electronically Signed By: Daryll Brod M.D. On: 10/02/2013 17:09  Microbiology:  No results found for this or any previous visit (from the past 240 hour(s)).  Labs:  Basic Metabolic Panel:   Recent Labs  Lab  10/02/13 1704  10/02/13 1941  10/03/13 0124  10/04/13 0342   NA  144  --  142  141   K  4.0  --  3.8  4.1   CL  109  --  107  105   CO2  24  --  24  24   GLUCOSE  137*  --  141*  96   BUN  17  --  16  18   CREATININE  0.92  0.84  0.90  0.91   CALCIUM  8.4  --  8.6  8.8    Liver Function Tests:   Recent Labs  Lab  10/03/13 0124  10/04/13 0342   AST  15  14   ALT  19  19   ALKPHOS  49  48   BILITOT  0.3  0.2*   PROT  6.1  6.1   ALBUMIN  3.3*  3.3*    No results found for this basename: LIPASE, AMYLASE, in the last 168 hours  No results found  for this basename: AMMONIA, in the last 168 hours  CBC:   Recent Labs  Lab  10/02/13 1704  10/02/13 1941  10/03/13 0124  10/04/13 0342   WBC  7.0  6.5  5.7  5.4   NEUTROABS  --  --  2.8  2.5   HGB  13.3  13.5  12.4*  12.2*   HCT  38.9*  39.7  36.6*  37.2*   MCV  78.6  78.5  78.7  79.8   PLT  189  191  163  169    Cardiac Enzymes:   Recent Labs  Lab  10/02/13 1941  10/03/13 0124  10/03/13 0700   TROPONINI  <0.30  <0.30  <0.30    BNP:  BNP (last 3 results)   Recent Labs   10/02/13 1726   PROBNP  12.8    CBG:  No results found for this basename: GLUCAP, in the last 168 hours  Signed:  Sherilee Smotherman S  [facility]  10/04/2013, 5:45 PM

## 2013-10-16 NOTE — Progress Notes (Signed)
Subjective:    Patient ID: Lawrence Liu, male    DOB: [birth year], 58 y.o.   MRN: [id number]  HPI Patient was recently admitted with substernal chest pain. Evaluation included a catheterization that showed minimal nonobstructive coronary artery disease with normal ejection fraction. Echocardiogram of the heart revealed a dilated aortic root. Cardiac MRI revealed the following: Right Sinus of valsalva: 4.7 cm  Left Sinus of Valsalva: 4.4 cm  Noncoronary Sinus: 4.3 cm  Ascending aortic root: 3.8 cm  The patient was discharged home with diagnosis of noncardiac chest pain. He has a followup scheduled for April 7 with his cardiologist Dr. Mare Ferrari.  Patient has no family history of Marfan syndrome. No one in the family has history of being diagnosed as "double jointed".  Patient does not have marfanoid features. He continues to have frequent PVCs and PACs even on Toprol-XL 25 mg pill daily. Past Medical History  Diagnosis Date  . RUQ abdominal pain   . Hypertension   . GERD (gastroesophageal reflux disease)   . Deep vein thrombosis 2004    Left deep vein thrombosis of left calf  . History of blood transfusion     as a baby for jaundice  . Anginal pain    Current Outpatient Prescriptions on File Prior to Visit  Medication Sig Dispense Refill  . amLODipine-benazepril (LOTREL) 5-20 MG per capsule Take 1 capsule by mouth daily.  90 capsule  0  . aspirin EC 81 MG tablet Take 81 mg by mouth daily.      . metoprolol succinate (TOPROL-XL) 25 MG 24 hr tablet Take 1 tablet (25 mg total) by mouth daily.  30 tablet  5  . pantoprazole (PROTONIX) 40 MG tablet Take 40 mg by mouth daily.      . tadalafil (CIALIS) 20 MG tablet Take 1 tablet (20 mg total) by mouth daily as needed for erectile dysfunction.  6 tablet  PRN   No current facility-administered medications on file prior to visit.   Allergies  Allergen Reactions  . Latex Anaphylaxis  . Penicillins   . Sulfa Antibiotics Other (See  Comments)    Hives...only when mixed sulfa and pencillin together   Past Surgical History  Procedure Laterality Date  . Shoulder arthroscopy  07/15/2011    Procedure: ARTHROSCOPY SHOULDER;  Surgeon: Metta Clines Supple;  Location: [facility];  Service: Orthopedics;  Laterality: Right;  RIGHT ARTHROSCOPY SUBACROMIAL DECOMPRESSION AND DISTAL CLAVICLE RESECTION  . Tonsillectomy  ~ 1970   History   Social History  . Marital Status: Married    Spouse Name: N/A    Number of Children: N/A  . Years of Education: N/A   Occupational History  . Not on file.   Social History Main Topics  . Smoking status: Never Smoker   . Smokeless tobacco: Never Used  . Alcohol Use: Yes     Comment: 10/02/2013 "hard apple cider maybe once/month"  . Drug Use: No  . Sexual Activity: Yes   Other Topics Concern  . Not on file   Social History Narrative  . No narrative on file     Review of Systems  All other systems reviewed and are negative.       Objective:   Physical Exam  Vitals reviewed. Constitutional: He appears well-developed and well-nourished. No distress.  Neck: Neck supple. No thyromegaly present.  Cardiovascular: Normal rate, regular rhythm, normal heart sounds and intact distal pulses.  Exam reveals no gallop and no friction rub.   No  murmur heard. Pulmonary/Chest: Effort normal and breath sounds normal. No respiratory distress. He has no wheezes. He has no rales.  Abdominal: Soft. Bowel sounds are normal. He exhibits no distension. There is no tenderness. There is no rebound and no guarding.  Lymphadenopathy:    He has no cervical adenopathy.  Skin: He is not diaphoretic.          Assessment & Plan:  1. Hospital discharge follow-up  2. Aortic root aneurysm We had long discussion today about his aneurysm. I recommended decreasing amlodipine 5 mg by mouth daily. I recommend increasing Toprol-XL to 50 mg by mouth daily due to the aneurysm and the frequency of PVCs and PACs. Monitor  his heart rate as well as blood pressure the changes. He also has a low HDL 39. We discussed options for increasing HDL. At the present time the patient like to try and increase his aerobic exercise. Would also discuss starting the patient on a statin given his history of nonobstructive coronary artery disease and a strong family history of heart disease. His fasting lipid panel however shows an LDL blood 70. The patient will consider possibly starting a statin but he might discuss this with his cardiologist first.

## 2013-10-18 ENCOUNTER — Telehealth: Payer: Self-pay | Admitting: Family Medicine

## 2013-10-18 MED ORDER — AMLODIPINE BESY-BENAZEPRIL HCL 2.5-10 MG PO CAPS: 1.0000 | ORAL_CAPSULE | Freq: Every day | ORAL | Status: DC

## 2013-10-18 NOTE — Telephone Encounter (Signed)
Call back number is [phone] Pt states that Dr Norva Riffle was wanting him to break his amLODipine-benazepril (LOTREL) 5-20 MG per capsule in half but it is a capsule and he is not able to do so. He is wanting to know if he can have a half of dosage called in.   [facility]

## 2013-10-18 NOTE — Telephone Encounter (Signed)
RX for lower dose sent to pharm.

## 2013-10-19 ENCOUNTER — Other Ambulatory Visit: Payer: Self-pay | Admitting: *Deleted

## 2013-10-19 MED ORDER — METOPROLOL SUCCINATE ER 50 MG PO TB24: 50.0000 mg | ORAL_TABLET | Freq: Every day | ORAL | Status: DC

## 2013-10-19 NOTE — Telephone Encounter (Signed)
Medication was increased to 50mg  Metoprolol, script sent to pt pharmacy

## 2013-11-06 ENCOUNTER — Ambulatory Visit (INDEPENDENT_AMBULATORY_CARE_PROVIDER_SITE_OTHER): Payer: 59 | Admitting: Cardiology

## 2013-11-06 ENCOUNTER — Encounter: Payer: Self-pay | Admitting: Cardiology

## 2013-11-06 VITALS — BP 124/76 | HR 80 | Ht 72.0 in | Wt 210.0 lb

## 2013-11-06 DIAGNOSIS — I493 Ventricular premature depolarization: Secondary | ICD-10-CM

## 2013-11-06 DIAGNOSIS — I4949 Other premature depolarization: Secondary | ICD-10-CM

## 2013-11-06 DIAGNOSIS — I119 Hypertensive heart disease without heart failure: Secondary | ICD-10-CM

## 2013-11-06 DIAGNOSIS — I77810 Thoracic aortic ectasia: Secondary | ICD-10-CM | POA: Insufficient documentation

## 2013-11-06 DIAGNOSIS — I712 Thoracic aortic aneurysm, without rupture: Secondary | ICD-10-CM

## 2013-11-06 DIAGNOSIS — I7121 Aneurysm of the ascending aorta, without rupture: Secondary | ICD-10-CM

## 2013-11-06 DIAGNOSIS — E785 Hyperlipidemia, unspecified: Secondary | ICD-10-CM

## 2013-11-06 HISTORY — DX: Aneurysm of the ascending aorta, without rupture: I71.21

## 2013-11-06 HISTORY — DX: Thoracic aortic aneurysm, without rupture: I71.2

## 2013-11-06 MED ORDER — ATORVASTATIN CALCIUM 20 MG PO TABS: 20.0000 mg | ORAL_TABLET | Freq: Every day | ORAL | Status: DC

## 2013-11-06 NOTE — Assessment & Plan Note (Signed)
There is no family history of Marfan's disease. The patient is not having any aortic insufficiency related to his aortic root dilatation.  We will follow up his aortic root with a cardiac CT in 6 months

## 2013-11-06 NOTE — Progress Notes (Signed)
Lawrence Liu Date of Birth:  1956-07-08 [address] [name], [name]  [address] [phone]         Fax   [phone]  History of Present Illness: This 58 year old gentleman is seen for a post hospital followup office visit.  He was hospitalized from 10/02/13 until 10/04/13 because of chest tightness.  He underwent an echocardiogram showing mild LVH with normal left ventricular systolic function and an ejection fraction of 60%.  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  There was no aortic insufficiency by echo.  The patient underwent a left heart cardiac catheterization which showed only mild diffuse coronary disease but no obstructive lesions.  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 Johnsie Cancel.  The plan is to get a followup cardiac CT in about 6 months. The patient continues to have occasional symptoms of chest tightness.  He also has occasional palpitations.  Both of these symptoms seem to occur at the end of a hard work today and particularly if he has to work the overnight shift.  He notes significant deterioration of his overall condition after working a night shift and it takes him about 40 hours to recover.  I gave him a handwritten note on a prescription pad today asking his employer to consider excusing him from night shift duty because of his medical condition.  Current Outpatient Prescriptions  Medication Sig Dispense Refill  . amlodipine-benazepril (LOTREL) 2.5-10 MG per capsule Take 1 capsule by mouth daily.  30 capsule  5  . aspirin EC 81 MG tablet Take 81 mg by mouth daily.      . metoprolol succinate (TOPROL-XL) 50 MG 24 hr tablet Take 1 tablet (50 mg total) by mouth daily. Take with or immediately following a meal.  90 tablet  3  . pantoprazole (PROTONIX) 40 MG tablet Take 40 mg by mouth daily.      . tadalafil (CIALIS) 20 MG tablet Take 1 tablet  (20 mg total) by mouth daily as needed for erectile dysfunction.  6 tablet  PRN  . atorvastatin (LIPITOR) 20 MG tablet Take 1 tablet (20 mg total) by mouth daily.  90 tablet  3   No current facility-administered medications for this visit.    Allergies  Allergen Reactions  . Latex Anaphylaxis  . Penicillins   . Sulfa Antibiotics Other (See Comments)    Hives...only when mixed sulfa and pencillin together    Patient Active Problem List   Diagnosis Date Noted  . Aortic root dilatation 11/06/2013  . 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 Chest tightness 10/02/2013  . PVC's (premature ventricular contractions) 10/02/2013  . History of DVT (deep vein thrombosis) 10/02/2013  . Chest pain 10/02/2013  . GERD (gastroesophageal reflux disease)   . Arthritis   . Deep vein thrombosis     History  Smoking status  . Never Smoker   Smokeless tobacco  . Never Used    History  Alcohol Use  . Yes    Comment: 10/02/2013 "hard apple cider maybe once/month"    Family History  Problem Relation Age of Onset  . Heart disease Father   . Diabetes Father     Review of Systems: Constitutional: no fever chills diaphoresis or fatigue or change in weight.  Head and neck: no hearing loss, no epistaxis, no photophobia or visual disturbance. Respiratory:  No cough, shortness of breath or wheezing. Cardiovascular: No chest pain peripheral edema, palpitations. Gastrointestinal: No abdominal distention, no abdominal pain, no change in bowel habits hematochezia or melena. Genitourinary: No dysuria, no frequency, no urgency, no nocturia. Musculoskeletal:No arthralgias, no back pain, no gait disturbance or myalgias. Neurological: No dizziness, no headaches, no numbness, no seizures, no syncope, no weakness, no tremors. Hematologic: No lymphadenopathy, no easy bruising. Psychiatric: No confusion, no hallucinations, no sleep disturbance.    Physical Exam: Filed Vitals:   11/06/13 1530  BP: 124/76    Pulse: 80   the general appearance reveals a well-developed well-nourished gentleman in no distress.The head and neck exam reveals pupils equal and reactive.  Extraocular movements are full.  There is no scleral icterus.  The mouth and pharynx are normal.  The neck is supple.  The carotids reveal no bruits.  The jugular venous pressure is normal.  The  thyroid is not enlarged.  There is no lymphadenopathy.  The chest is clear to percussion and auscultation.  There are no rales or rhonchi.  Expansion of the chest is symmetrical.  The precordium is quiet.  The first heart sound is normal.  The second heart sound is physiologically split.  There is no murmur gallop rub or click.  There is no abnormal lift or heave.  The abdomen is soft and nontender.  The bowel sounds are normal.  The liver and spleen are not enlarged.  There are no abdominal masses.  There are no abdominal bruits.  Extremities reveal good pedal pulses.  There is no phlebitis or edema.  There is no cyanosis or clubbing.  Strength is normal and symmetrical in all extremities.  There is no lateralizing weakness.  There are no sensory deficits.  The skin is warm and dry.  There is no rash.  EKG shows normal sinus rhythm at 80 per minute.  It is a normal EKG.  No ischemic changes.  No LVH.   Assessment / Plan: Continue on current therapy.  Add Lipitor 20 mg one daily. Recheck in 3 months for followup office visit and fasting lab work. I have asked him to avoid night duty at work for medical reasons.

## 2013-11-06 NOTE — Assessment & Plan Note (Signed)
EKG today shows normal sinus rhythm and no PVCs.  As noted his PVCs are more likely to occur at the end of a difficult work day.

## 2013-11-06 NOTE — Assessment & Plan Note (Signed)
His LDL is only 56.  He is following a careful diet.  Nonetheless he has a very strong family history of coronary disease and his own cardiac catheterization showed coronary artery plaque.  For this reason I think he would benefit from low-dose statin therapy and we will start him on Lipitor 20 mg one daily.

## 2013-11-06 NOTE — Patient Instructions (Signed)
START LIPITOR 20 MG DAILY, RX SENT TO [facility]   Your physician wants you to follow-up in: 3 months with fasting labs (LP/BMET/HFP)  You will receive a reminder letter in the mail two months in advance. If you don't receive a letter, please call our office to schedule the follow-up appointment.

## 2013-11-08 ENCOUNTER — Telehealth: Payer: Self-pay | Admitting: Cardiology

## 2013-11-08 NOTE — Telephone Encounter (Signed)
Patient will be bringing papers by tomorrow and leaving at front desk.

## 2013-11-08 NOTE — Telephone Encounter (Signed)
New Message:  Pt is requesting a call back from [place]. States he will give more details when she returns his call.

## 2013-11-09 ENCOUNTER — Telehealth: Payer: Self-pay | Admitting: Cardiology

## 2013-11-09 NOTE — Telephone Encounter (Signed)
Walk-In patient form received from patient. Taken to Melinda:djc

## 2013-11-13 NOTE — Telephone Encounter (Signed)
Forms completed and will fax to Human Resources at [phone] Annette Stable will be mailed to patient

## 2013-11-13 NOTE — Telephone Encounter (Signed)
Patient aware.

## 2013-12-14 ENCOUNTER — Telehealth: Payer: Self-pay | Admitting: Family Medicine

## 2013-12-14 MED ORDER — AMOXICILLIN 875 MG PO TABS: 875.0000 mg | ORAL_TABLET | Freq: Two times a day (BID) | ORAL | Status: DC

## 2013-12-14 NOTE — Telephone Encounter (Signed)
If not pcn allergic, amox 875 pobid for 10 days.

## 2013-12-14 NOTE — Telephone Encounter (Signed)
PATIENT IS CALLING TO SAY THAT HE HAS A SINUS INFECTION WOULD LIKE TO KNOW IF WE CAN CALL SOMETHING IN FOR HIM  [facility] AND HIS BEST CALL BACK NUMBER IS [phone]

## 2013-12-14 NOTE — Telephone Encounter (Signed)
Med sent to pharm and pt aware - asked pt about PCN allergy and he said he was not allergic to it by itself but mixed with sulfa he had a rxn but he is fine taking it alone and has done so in the past. Also wanted you to know that his BP's have averaged 130/70 and pulse in the 60's.

## 2013-12-25 ENCOUNTER — Telehealth: Payer: Self-pay | Admitting: Family Medicine

## 2013-12-25 MED ORDER — AMLODIPINE BESY-BENAZEPRIL HCL 5-10 MG PO CAPS: 1.0000 | ORAL_CAPSULE | Freq: Every day | ORAL | Status: DC

## 2013-12-25 NOTE — Telephone Encounter (Signed)
[phone] Pt is needing a refill on  amlodipine-benazepril (LOTREL) 2.5-10 MG per capsule Needs to be the 5mg  and 90 day supply   Pharmacy [facility] Patient

## 2013-12-25 NOTE — Telephone Encounter (Signed)
Rx Refilled  

## 2014-04-23 ENCOUNTER — Other Ambulatory Visit (INDEPENDENT_AMBULATORY_CARE_PROVIDER_SITE_OTHER): Payer: 59

## 2014-04-23 DIAGNOSIS — I119 Hypertensive heart disease without heart failure: Secondary | ICD-10-CM

## 2014-04-23 DIAGNOSIS — E785 Hyperlipidemia, unspecified: Secondary | ICD-10-CM

## 2014-04-23 LAB — LIPID PANEL
CHOLESTEROL: 85 mg/dL (ref 0–200)
HDL: 29.4 mg/dL — AB (ref >39.00)
LDL CALC: 37 mg/dL (ref 0–99)
NonHDL: 55.6
Total CHOL/HDL Ratio: 3
Triglycerides: 92 mg/dL (ref 0.0–149.0)
VLDL: 18.4 mg/dL (ref 0.0–40.0)

## 2014-04-23 LAB — HEPATIC FUNCTION PANEL
ALBUMIN: 4.1 g/dL (ref 3.5–5.2)
ALT: 21 U/L (ref 0–53)
AST: 16 U/L (ref 0–37)
Alkaline Phosphatase: 60 U/L (ref 39–117)
Bilirubin, Direct: 0.1 mg/dL (ref 0.0–0.3)
Total Bilirubin: 1 mg/dL (ref 0.2–1.2)
Total Protein: 7.3 g/dL (ref 6.0–8.3)

## 2014-04-23 LAB — BASIC METABOLIC PANEL
BUN: 20 mg/dL (ref 6–23)
CALCIUM: 9 mg/dL (ref 8.4–10.5)
CO2: 26 mEq/L (ref 19–32)
Chloride: 106 mEq/L (ref 96–112)
Creatinine, Ser: 1.1 mg/dL (ref 0.4–1.5)
GFR: 76.85 mL/min (ref >60.00)
GLUCOSE: 105 mg/dL — AB (ref 70–99)
POTASSIUM: 4.5 meq/L (ref 3.5–5.1)
Sodium: 137 mEq/L (ref 135–145)

## 2014-04-23 NOTE — Progress Notes (Signed)
Quick Note:  Please make copy of labs for patient visit. ______ 

## 2014-04-25 ENCOUNTER — Encounter: Payer: Self-pay | Admitting: Cardiology

## 2014-04-25 ENCOUNTER — Ambulatory Visit (INDEPENDENT_AMBULATORY_CARE_PROVIDER_SITE_OTHER): Payer: 59 | Admitting: Cardiology

## 2014-04-25 VITALS — BP 124/78 | HR 69 | Ht 72.0 in | Wt 214.0 lb

## 2014-04-25 DIAGNOSIS — I77810 Thoracic aortic ectasia: Secondary | ICD-10-CM

## 2014-04-25 DIAGNOSIS — E785 Hyperlipidemia, unspecified: Secondary | ICD-10-CM

## 2014-04-25 DIAGNOSIS — Q2549 Other congenital malformations of aorta: Secondary | ICD-10-CM

## 2014-04-25 DIAGNOSIS — K219 Gastro-esophageal reflux disease without esophagitis: Secondary | ICD-10-CM

## 2014-04-25 DIAGNOSIS — E7800 Pure hypercholesterolemia, unspecified: Secondary | ICD-10-CM

## 2014-04-25 DIAGNOSIS — Q254 Other congenital malformations of aorta: Secondary | ICD-10-CM

## 2014-04-25 NOTE — Assessment & Plan Note (Signed)
No recent symptoms of GERD or heartburn

## 2014-04-25 NOTE — Patient Instructions (Signed)
Your physician wants you to follow-up in: 6 months with fasting labs (lp/bmet/hfp) AND EKG  You will receive a reminder letter in the mail two months in advance. If you don't receive a letter, please call our office to schedule the follow-up appointment.   YOUR PHYSICIAN HAS ORDERED A CARDIAC CTA   DECREASE YOUR LIPITOR TO 10 MG DAILY

## 2014-04-25 NOTE — Assessment & Plan Note (Signed)
The patient does not have any auscultatory evidence of aortic insufficiency associated with his aortic root dilatation.

## 2014-04-25 NOTE — Assessment & Plan Note (Signed)
The patient has had an excellent response to Lipitor 20 mg daily.  We will be able to reduce his Lipitor to just 10 mg a day.  He has not had any myalgias.

## 2014-04-25 NOTE — Progress Notes (Signed)
Lawrence Liu Date of Birth:  27-Aug-1955 [place] [address] [place], [place]  [address] [phone]        Fax   [phone]   History of Present Illness: This 58 year old gentleman is seen for a six-month followup office visit. He was hospitalized from 10/02/13 until 10/04/13 because of chest tightness. He underwent an echocardiogram showing mild LVH with normal left ventricular systolic function and an ejection fraction of 60%. 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 There was no aortic insufficiency by echo. The patient underwent a left heart cardiac catheterization which showed only mild diffuse coronary disease but no obstructive lesions. 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 Johnsie Cancel. The plan is to get a followup cardiac CT in about 6 months.  Since last visit the patient has been doing reasonably well.  He notes occasional chest tightness which begins with interscapular discomfort and then radiates anteriorly.  It lasts about 5 minutes.  If he can sit down and rest because of weight.  He has not been aware of any arrhythmia.  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  He has a history of GERD but has not had any recent symptoms. Current Outpatient Prescriptions  Medication Sig Dispense Refill  . amLODipine-benazepril (LOTREL) 5-10 MG per capsule Take 1 capsule by mouth daily.  90 capsule  3  . aspirin EC 81 MG tablet Take 81 mg by mouth daily.      Marland Kitchen atorvastatin (LIPITOR) 20 MG tablet Take 20 mg by mouth as directed. 1/2 TABLET DAILY      . metoprolol succinate (TOPROL-XL) 50 MG 24 hr tablet Take 1 tablet (50 mg total) by mouth daily. Take with or immediately following a meal.  90 tablet  3  . pantoprazole (PROTONIX) 40 MG tablet Take 40 mg by mouth daily.      . tadalafil (CIALIS) 20 MG  tablet Take 1 tablet (20 mg total) by mouth daily as needed for erectile dysfunction.  6 tablet  PRN   No current facility-administered medications for this visit.    Allergies  Allergen Reactions  . Latex Anaphylaxis  . Penicillins   . Sulfa Antibiotics Other (See Comments)    Hives...only when mixed sulfa and pencillin together    Patient Active Problem List   Diagnosis Date Noted  . Aortic root dilatation 11/06/2013  . 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 Chest tightness 10/02/2013  . PVC's (premature ventricular contractions) 10/02/2013  . History of DVT (deep vein thrombosis) 10/02/2013  . Chest pain 10/02/2013  . GERD (gastroesophageal reflux disease)   . Arthritis   . Deep vein thrombosis     History  Smoking status  . Never Smoker   Smokeless tobacco  . Never Used    History  Alcohol Use  . Yes    Comment: 10/02/2013 "hard apple cider maybe once/month"    Family History  Problem Relation Age of Onset  . Heart disease Father   . Diabetes Father     Review of Systems: Constitutional: no fever chills diaphoresis or fatigue or change in weight.  Head and neck: no hearing loss, no epistaxis, no photophobia or visual disturbance. Respiratory: No cough, shortness of breath or wheezing. Cardiovascular: No chest pain peripheral edema, palpitations.  Gastrointestinal: No abdominal distention, no abdominal pain, no change in bowel habits hematochezia or melena. Genitourinary: No dysuria, no frequency, no urgency, no nocturia. Musculoskeletal:No arthralgias, no back pain, no gait disturbance or myalgias. Neurological: No dizziness, no headaches, no numbness, no seizures, no syncope, no weakness, no tremors. Hematologic: No lymphadenopathy, no easy bruising. Psychiatric: No confusion, no hallucinations, no sleep disturbance.    Physical Exam: Filed Vitals:   04/25/14 1159  BP: 124/78  Pulse: 69  The patient appears to be in no distress.  Head and neck exam  reveals that the pupils are equal and reactive.  The extraocular movements are full.  There is no scleral icterus.  Mouth and pharynx are benign.  No lymphadenopathy.  No carotid bruits.  The jugular venous pressure is normal.  Thyroid is not enlarged or tender.  Chest is clear to percussion and auscultation.  No rales or rhonchi.  Expansion of the chest is symmetrical.  Heart reveals no abnormal lift or heave.  First and second heart sounds are normal.  There is no murmur gallop rub or click.  The abdomen is soft and nontender.  Bowel sounds are normoactive.  There is no hepatosplenomegaly or mass.  There are no abdominal bruits.  Extremities reveal no phlebitis or edema.  Pedal pulses are good.  There is no cyanosis or clubbing.  Neurologic exam is normal strength and no lateralizing weakness.  No sensory deficits.  Integument reveals no rash    Assessment / Plan: 1. dilated aortic root without aortic insufficiency 2. essential hypertension 3. history of GERD 4. mild diffuse coronary disease but no obstructive lesions by heart catheterization March 2015  Disposition: He will reduce his Lipitor to just 10 mg daily. We will schedule for a cardiac CT to followup on his dilated sinuses of Valsalva as recommended by Dr. Johnsie Cancel.  Recheck here in 6 months for office visit EKG lipid panel hepatic function panel and basal metabolic panel

## 2014-05-03 ENCOUNTER — Encounter: Payer: Self-pay | Admitting: Cardiology

## 2014-05-10 ENCOUNTER — Ambulatory Visit (HOSPITAL_COMMUNITY)
Admission: RE | Admit: 2014-05-10 | Discharge: 2014-05-10 | Disposition: A | Discharge status: Home / Self Care | Payer: 59 | Source: Ambulatory Visit | Attending: Cardiology | Admitting: Cardiology

## 2014-05-10 DIAGNOSIS — I77819 Aortic ectasia, unspecified site: Secondary | ICD-10-CM | POA: Insufficient documentation

## 2014-05-10 DIAGNOSIS — I712 Thoracic aortic aneurysm, without rupture: Secondary | ICD-10-CM | POA: Diagnosis not present

## 2014-05-10 DIAGNOSIS — Q2549 Other congenital malformations of aorta: Secondary | ICD-10-CM

## 2014-05-10 DIAGNOSIS — I350 Nonrheumatic aortic (valve) stenosis: Secondary | ICD-10-CM

## 2014-05-10 MED ORDER — NITROGLYCERIN 0.4 MG SL SUBL
SUBLINGUAL_TABLET | SUBLINGUAL | Status: AC
  Administered 2014-05-10: 0.4 mg via SUBLINGUAL
  Filled 2014-05-10: qty 1

## 2014-05-10 MED ORDER — IOHEXOL 350 MG/ML SOLN
80.0000 mL | Freq: Once | INTRAVENOUS | Status: AC | PRN
  Administered 2014-05-10: 80 mL via INTRAVENOUS

## 2014-05-10 MED ORDER — NITROGLYCERIN 0.4 MG SL SUBL
0.4000 mg | SUBLINGUAL_TABLET | SUBLINGUAL | Status: DC | PRN
  Administered 2014-05-10: 0.4 mg via SUBLINGUAL
  Filled 2014-05-10: qty 25

## 2014-05-10 MED ORDER — METOPROLOL TARTRATE 1 MG/ML IV SOLN
5.0000 mg | INTRAVENOUS | Status: DC | PRN
  Administered 2014-05-10: 2.5 mg via INTRAVENOUS
  Administered 2014-05-10: 5 mg via INTRAVENOUS
  Filled 2014-05-10: qty 5

## 2014-05-10 MED ORDER — METOPROLOL TARTRATE 1 MG/ML IV SOLN
INTRAVENOUS | Status: AC
  Administered 2014-05-10: 2.5 mg via INTRAVENOUS
  Filled 2014-05-10: qty 10

## 2014-05-13 ENCOUNTER — Telehealth: Payer: Self-pay | Admitting: *Deleted

## 2014-05-13 NOTE — Telephone Encounter (Signed)
Try increasing the Lotrel to 5/20 one daily and see if the diastolic improves closer to 60.

## 2014-05-13 NOTE — Telephone Encounter (Signed)
Advised patient. Patient wanted  Dr. Mare Ferrari to be aware that blood pressure running 120's/75 and had been told at one time diastolic should be in 70'D. Will forward to  Dr. Mare Ferrari for review

## 2014-05-13 NOTE — Telephone Encounter (Signed)
Message copied by Earvin Hansen on Mon May 13, 2014  3:57 PM ------      Message from: Darlin Coco      Created: Fri May 10, 2014  5:04 PM       Please report.  The Cardiac CT shows that the aortic root is stable.  No change.  Continue same medicines. ------

## 2014-05-14 NOTE — Telephone Encounter (Signed)
Left message to call back  

## 2014-05-20 MED ORDER — AMLODIPINE BESY-BENAZEPRIL HCL 5-20 MG PO CAPS: 1.0000 | ORAL_CAPSULE | Freq: Every day | ORAL | Status: DC

## 2014-05-20 NOTE — Telephone Encounter (Signed)
Advised patient, verbalized understanding  

## 2014-06-28 ENCOUNTER — Telehealth: Payer: Self-pay | Admitting: Cardiology

## 2014-06-28 NOTE — Telephone Encounter (Signed)
Received request from Nurse fax box, documents faxed for surgical clearance. To: [facility] Fax number: [phone] Attention: 11.27.15/km

## 2014-07-11 ENCOUNTER — Encounter (HOSPITAL_COMMUNITY): Payer: Self-pay | Admitting: Internal Medicine

## 2014-09-26 ENCOUNTER — Other Ambulatory Visit: Payer: Self-pay | Admitting: Family Medicine

## 2014-09-26 NOTE — Telephone Encounter (Signed)
ok 

## 2014-09-26 NOTE — Telephone Encounter (Signed)
Ok to refill??  Last office visit 10/16/2013.  Last refill 09/20/2013, PRN refills.

## 2014-09-26 NOTE — Telephone Encounter (Signed)
Prescription sent to pharmacy.

## 2014-11-18 ENCOUNTER — Encounter: Payer: Self-pay | Admitting: Family Medicine

## 2014-11-18 ENCOUNTER — Ambulatory Visit (INDEPENDENT_AMBULATORY_CARE_PROVIDER_SITE_OTHER): Payer: 59 | Admitting: Family Medicine

## 2014-11-18 VITALS — BP 128/68 | HR 72 | Temp 98.6°F | Resp 14 | Ht 72.0 in | Wt 215.0 lb

## 2014-11-18 DIAGNOSIS — A6920 Lyme disease, unspecified: Secondary | ICD-10-CM | POA: Diagnosis not present

## 2014-11-18 DIAGNOSIS — T148 Other injury of unspecified body region: Secondary | ICD-10-CM | POA: Diagnosis not present

## 2014-11-18 DIAGNOSIS — W57XXXA Bitten or stung by nonvenomous insect and other nonvenomous arthropods, initial encounter: Secondary | ICD-10-CM

## 2014-11-18 MED ORDER — DOXYCYCLINE HYCLATE 100 MG PO TABS: 100.0000 mg | ORAL_TABLET | Freq: Two times a day (BID) | ORAL | Status: DC

## 2014-11-18 NOTE — Progress Notes (Signed)
Patient ID: Lawrence Liu, male   DOB: June 25, 1956, 59 y.o.   MRN: [id number]   Subjective:    Patient ID: Lawrence Liu, male    DOB: October 22, 1955, 59 y.o.   MRN: [id number]  Patient presents for Tick Bite patient here with tick bite. He pulled the tick off of his right buttocks about 2 weeks ago there was minimal redness at the time to week later he developed a circular rash which she noticed yesterday and he sought care. He's not had any fever no joint pain no fatigue no nausea vomiting. 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 Lone star tick pulled off   Review Of Systems: per above  GEN- denies fatigue, fever, weight loss,weakness, recent illness HEENT- denies eye drainage, change in vision, nasal discharge, CVS- denies chest pain, palpitations RESP- denies SOB, cough, wheeze ABD- denies N/V, change in stools, abd pain GU- denies dysuria, hematuria, dribbling, incontinence MSK- denies joint pain, muscle aches, injury Neuro- denies headache, dizziness, syncope, seizure activity       Objective:    BP 128/68 mmHg  Pulse 72  Temp(Src) 98.6 F (37 C) (Oral)  Resp 14  Ht 6' (1.829 m)  Wt 215 lb (97.523 kg)  BMI 29.15 kg/m2 GEN- NAD, alert and oriented x3 Skin- Right buttucks erythema bulls eye lesion measure 9x8cm, indurated, non tender, no fluctance       Assessment & Plan:      Problem List Items Addressed This Visit    None    Visit Diagnoses    Tick bite    -  Primary    Erythema migrans (Lyme disease)        Treat with doxycycline x 14 days, discussed disseminated disease signs, he will f/u if rash not improving in 1 week, he has history of reactions with ticks    Relevant Medications    doxycycline (VIBRA-TABS) 100 MG tablet       Note: This dictation was prepared with Dragon dictation along with smaller phrase technology. Any transcriptional errors that result from this process are unintentional.

## 2014-11-18 NOTE — Patient Instructions (Signed)
Start antibiotics as prescribed Continue other medications F/U in 1 week if not improved

## 2014-11-19 ENCOUNTER — Ambulatory Visit (INDEPENDENT_AMBULATORY_CARE_PROVIDER_SITE_OTHER): Payer: 59 | Admitting: Cardiology

## 2014-11-19 ENCOUNTER — Encounter: Payer: Self-pay | Admitting: Cardiology

## 2014-11-19 VITALS — BP 112/84 | HR 70 | Ht 72.0 in | Wt 215.4 lb

## 2014-11-19 DIAGNOSIS — E7800 Pure hypercholesterolemia, unspecified: Secondary | ICD-10-CM

## 2014-11-19 DIAGNOSIS — I77810 Thoracic aortic ectasia: Secondary | ICD-10-CM | POA: Diagnosis not present

## 2014-11-19 DIAGNOSIS — I119 Hypertensive heart disease without heart failure: Secondary | ICD-10-CM

## 2014-11-19 NOTE — Progress Notes (Signed)
Cardiology Office Note   Date:  11/19/2014   ID:  Lawrence Liu, DOB 11-09-1955, MRN [id number]  PCP:  Odette Fraction, MD  Cardiologist:   Darlin Coco, MD   No chief complaint on file.     History of Present Illness: Lawrence Liu is a 59 y.o. male who presents for a six-month follow-up office visit.  This 59 year old gentleman is seen for a six-month followup office visit. He was hospitalized from 10/02/13 until 10/04/13 because of chest tightness. He underwent an echocardiogram showing mild LVH with normal left ventricular systolic function and an ejection fraction of 60%. 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 There was no aortic insufficiency by echo. The patient underwent a left heart cardiac catheterization which showed only mild diffuse coronary disease but no obstructive lesions. 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 Johnsie Cancel. The plan is to get a followup cardiac CT in about 6 months.  Since last visit the patient has been doing reasonably well.  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 He has a history of GERD but has not had any recent symptoms.  He has some occasional shortness of breath and occasional slight chest tightness which is relieved by rest. He walks his dog's twice a day for exercise without any difficulty in terms of shortness of breath.  He is not having any dizziness or syncope. He was recently treated by Dr. Cindi Carbon for tick bite in the region of the left hip.  He is currently on doxycycline for that.  Past Medical History  Diagnosis Date  . RUQ abdominal pain   . Hypertension   . GERD (gastroesophageal reflux disease)   . Deep vein thrombosis 2004    Left deep vein thrombosis of left calf  . History of blood transfusion     as a baby for jaundice  . Anginal pain   .  Alpha galactosidase deficiency     multiple tick bites    Past Surgical History  Procedure Laterality Date  . Shoulder arthroscopy  07/15/2011    Procedure: ARTHROSCOPY SHOULDER;  Surgeon: Metta Clines Supple;  Location: [facility];  Service: Orthopedics;  Laterality: Right;  RIGHT ARTHROSCOPY SUBACROMIAL DECOMPRESSION AND DISTAL CLAVICLE RESECTION  . Tonsillectomy  ~ 1970  . Left heart catheterization with coronary angiogram N/A 10/03/2013    Procedure: LEFT HEART CATHETERIZATION WITH CORONARY ANGIOGRAM;  Surgeon: Jolaine Artist, MD;  Location: [facility] CATH LAB;  Service: Cardiovascular;  Laterality: N/A;     Current Outpatient Prescriptions  Medication Sig Dispense Refill  . amLODipine-benazepril (LOTREL) 5-20 MG per capsule Take 1 capsule by mouth daily. 90 capsule 3  . aspirin EC 81 MG tablet Take 81 mg by mouth daily.    Marland Kitchen atorvastatin (LIPITOR) 20 MG tablet Take 20 mg by mouth as directed. 1/2 TABLET DAILY    . CIALIS 20 MG tablet TAKE 1 TABLET BY MOUTH DAILY AS NEEDED FOR ERECTILE DYSFUNCTION. 6 tablet PRN  . doxycycline (VIBRA-TABS) 100 MG tablet Take 1 tablet (100 mg total) by mouth 2 (two) times daily. 28 tablet 0  . metoprolol succinate (TOPROL-XL) 50 MG 24 hr tablet Take 1 tablet (50 mg total) by mouth daily. Take with or immediately following a meal. 90 tablet 3  . pantoprazole (PROTONIX) 40  MG tablet Take 40 mg by mouth daily.     No current facility-administered medications for this visit.    Allergies:   Latex; Penicillins; and Sulfa antibiotics    Social History:  The patient  reports that he has never smoked. He has never used smokeless tobacco. He reports that he drinks alcohol. He reports that he does not use illicit drugs.   Family History:  The patient's family history includes Diabetes in his father; Heart disease in his father.    ROS:  Please see the history of present illness.   Otherwise, review of systems are positive for none.   All other systems are reviewed and  negative.    PHYSICAL EXAM: VS:  BP 112/84 mmHg  Pulse 70  Ht 6' (1.829 m)  Wt 215 lb 6.4 oz (97.705 kg)  BMI 29.21 kg/m2 , BMI Body mass index is 29.21 kg/(m^2). GEN: Well nourished, well developed, in no acute distress HEENT: normal Neck: no JVD, carotid bruits, or masses Cardiac: RRR; no murmurs, rubs, or gallops,no edema  Respiratory:  clear to auscultation bilaterally, normal work of breathing GI: soft, nontender, nondistended, + BS MS: no deformity or atrophy Skin: warm and dry, no rash Neuro:  Strength and sensation are intact Psych: euthymic mood, full affect   EKG:  EKG is ordered today. The ekg ordered today demonstrates normal sinus rhythm.  Within normal limits.   Recent Labs: 04/23/2014: ALT 21; BUN 20; Creatinine 1.1; Potassium 4.5; Sodium 137    Lipid Panel    Component Value Date/Time   CHOL 85 04/23/2014 1114   TRIG 92.0 04/23/2014 1114   HDL 29.40* 04/23/2014 1114   CHOLHDL 3 04/23/2014 1114   VLDL 18.4 04/23/2014 1114   LDLCALC 37 04/23/2014 1114      Wt Readings from Last 3 Encounters:  11/19/14 215 lb 6.4 oz (97.705 kg)  11/18/14 215 lb (97.523 kg)  04/25/14 214 lb (97.07 kg)         ASSESSMENT AND PLAN:  1. 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 essential hypertension 3. history of GERD 4. mild diffuse coronary disease but no obstructive lesions by heart catheterization March 2015.  Mild calcified plaque in the proximal and distal LAD and mid RCA distribution, nonobstructive by coronary CT in October 2015  Disposition: Continue current medication..  Recheck in 6 months for follow-up office visit and after that consider follow-up CT of ascending aorta.   Current medicines are reviewed at length with the patient today.  The patient does not have concerns  regarding medicines.  The following changes have been made:  no change  Labs/ tests ordered today include:   Orders Placed This Encounter  Procedures  . EKG 12-Lead       Signed, Darlin Coco, MD  11/19/2014 4:56 PM    [facility] [place], [place], [place]  [address] Phone: [phone]; Fax: [phone]

## 2014-11-19 NOTE — Patient Instructions (Signed)
Medication Instructions:  Your physician recommends that you continue on your current medications as directed. Please refer to the Current Medication list given to you today.   Labwork: NONE  Testing/Procedures: NONE  Follow-Up: Your physician wants you to follow-up in: [place] will receive a reminder letter in the mail two months in advance. If you don't receive a letter, please call our office to schedule the follow-up appointment.   Any Other Special Instructions Will Be Listed Below (If Applicable).

## 2015-01-28 ENCOUNTER — Other Ambulatory Visit: Payer: Self-pay | Admitting: Family Medicine

## 2015-02-17 ENCOUNTER — Other Ambulatory Visit: Payer: Self-pay | Admitting: Cardiology

## 2015-06-06 ENCOUNTER — Ambulatory Visit (INDEPENDENT_AMBULATORY_CARE_PROVIDER_SITE_OTHER): Payer: 59 | Admitting: Cardiology

## 2015-06-06 ENCOUNTER — Encounter: Payer: Self-pay | Admitting: Cardiology

## 2015-06-06 VITALS — BP 130/80 | HR 67 | Ht 73.0 in | Wt 213.8 lb

## 2015-06-06 DIAGNOSIS — I77810 Thoracic aortic ectasia: Secondary | ICD-10-CM | POA: Diagnosis not present

## 2015-06-06 DIAGNOSIS — I119 Hypertensive heart disease without heart failure: Secondary | ICD-10-CM

## 2015-06-06 MED ORDER — AMLODIPINE BESY-BENAZEPRIL HCL 5-20 MG PO CAPS: 1.0000 | ORAL_CAPSULE | Freq: Every day | ORAL | Status: DC

## 2015-06-06 NOTE — Progress Notes (Signed)
Cardiology Office Note   Date:  06/06/2015   ID:  Lawrence Liu, DOB [birth year], MRN [id number]  PCP:  Odette Fraction, MD  Cardiologist: Darlin Coco MD  Chief Complaint  Patient presents with  . Shortness of Breath      History of Present Illness: Lawrence Liu is a 59 y.o. male who presents for scheduled 6 month follow-up visit  This 60 year old gentleman is seen for a six-month followup office visit. He was hospitalized from 10/02/13 until 10/04/13 because of chest tightness. He underwent an echocardiogram showing mild LVH with normal left ventricular systolic function and an ejection fraction of 60%. 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 There was no aortic insufficiency by echo. The patient underwent a left heart cardiac catheterization which showed only mild diffuse coronary disease but no obstructive lesions. 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 Johnsie Cancel.  Since last visit the patient has been doing reasonably well.  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 He has a history of GERD but has not had any recent symptoms. He has some occasional shortness of breath and occasional slight chest tightness which is relieved by rest. His work schedule will now be 11 AM to 11 PM and this is fine for him. Since last visit he's had no new cardiac symptoms.  He denies chest pain or shortness of breath or dizziness or syncope.  His blood pressure is stable.  Past Medical History  Diagnosis Date  . RUQ abdominal pain   . Hypertension   . GERD (gastroesophageal reflux disease)   . Deep vein thrombosis ([place]) 2004    Left deep vein thrombosis of left calf  . History of blood transfusion     as a baby for jaundice  . Anginal pain ([place])   . Alpha galactosidase deficiency     multiple tick bites    Past  Surgical History  Procedure Laterality Date  . Shoulder arthroscopy  07/15/2011    Procedure: ARTHROSCOPY SHOULDER;  Surgeon: Metta Clines Supple;  Location: [facility];  Service: Orthopedics;  Laterality: Right;  RIGHT ARTHROSCOPY SUBACROMIAL DECOMPRESSION AND DISTAL CLAVICLE RESECTION  . Tonsillectomy  ~ 1970  . Left heart catheterization with coronary angiogram N/A 10/03/2013    Procedure: LEFT HEART CATHETERIZATION WITH CORONARY ANGIOGRAM;  Surgeon: Jolaine Artist, MD;  Location: [facility] CATH LAB;  Service: Cardiovascular;  Laterality: N/A;     Current Outpatient Prescriptions  Medication Sig Dispense Refill  . amLODipine-benazepril (LOTREL) 5-20 MG capsule Take 1 capsule by mouth daily. 90 capsule 3  . aspirin EC 81 MG tablet Take 81 mg by mouth daily.    Marland Kitchen atorvastatin (LIPITOR) 20 MG tablet TAKE 1 TABLET BY MOUTH DAILY. 90 tablet 0  . CIALIS 20 MG tablet TAKE 1 TABLET BY MOUTH DAILY AS NEEDED FOR ERECTILE DYSFUNCTION. 6 tablet PRN  . metoprolol succinate (TOPROL-XL) 50 MG 24 hr tablet TAKE 1 TABLET BY MOUTH DAILY. TAKE WITH OR IMMEDIATELY FOLLOWING A MEAL. 90 tablet 3   No current facility-administered medications for this visit.    Allergies:   Latex; Penicillins; and Sulfa antibiotics    Social History:  The patient  reports that he has never smoked. He has never used smokeless tobacco. He reports that he drinks alcohol. He reports  that he does not use illicit drugs.   Family History:  The patient's family history includes Diabetes in his father and mother; Heart disease in his father.    ROS:  Please see the history of present illness.   Otherwise, review of systems are positive for none.   All other systems are reviewed and negative.    PHYSICAL EXAM: VS:  BP 130/80 mmHg  Pulse 67  Ht 6\' 1"  (1.854 m)  Wt 213 lb 12.8 oz (96.979 kg)  BMI 28.21 kg/m2 , BMI Body mass index is 28.21 kg/(m^2). GEN: Well nourished, well developed, in no acute distress HEENT: normal Neck: no JVD,  carotid bruits, or masses Cardiac: RRR; no murmurs, rubs, or gallops,no edema  Respiratory:  clear to auscultation bilaterally, normal work of breathing GI: soft, nontender, nondistended, + BS MS: no deformity or atrophy Skin: warm and dry, no rash Neuro:  Strength and sensation are intact Psych: euthymic mood, full affect   EKG:  EKG is not ordered today.   Recent Labs: No results found for requested labs within last 365 days.    Lipid Panel    Component Value Date/Time   CHOL 85 04/23/2014 1114   TRIG 92.0 04/23/2014 1114   HDL 29.40* 04/23/2014 1114   CHOLHDL 3 04/23/2014 1114   VLDL 18.4 04/23/2014 1114   LDLCALC 37 04/23/2014 1114      Wt Readings from Last 3 Encounters:  06/06/15 213 lb 12.8 oz (96.979 kg)  11/19/14 215 lb 6.4 oz (97.705 kg)  11/18/14 215 lb (97.523 kg)        ASSESSMENT AND PLAN:  1. 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 essential hypertension 3. history of GERD 4. mild diffuse coronary disease but no obstructive lesions by heart catheterization March 2015. Mild calcified plaque in the proximal and distal LAD and mid RCA distribution, nonobstructive by coronary CT in October 2015  Disposition: Continue current medication.. We will have him get a CT angiogram of the chest with contrast to evaluate the size of his ascending thoracic aorta. Recheck in 6 months for follow-up office visit and EKG   Current medicines are reviewed at length with the patient today.  The patient does not have concerns regarding medicines.  The following changes have been made:  no change  Labs/ tests ordered today include:   Orders Placed This Encounter  Procedures  . Basic metabolic panel       Signed, Darlin Coco MD 06/06/2015 10:34 AM    [address], [place], [place]  [address] Phone: [phone]; Fax: [phone]

## 2015-06-06 NOTE — Patient Instructions (Addendum)
Medication Instructions:  Your physician recommends that you continue on your current medications as directed. Please refer to the Current Medication list given to you today.  Labwork: none  Testing/Procedures: CT angio of chest with contrast SCHEDULED FOR 06/11/15 ARRIVE AT 9:15 AT OUR OFFICE  NO SOLID FOODS 2 HOURS PRIOR  Follow-Up: Your physician recommends that you schedule a follow-up appointment in: 6 month ov/ekg with Tera Helper NP or Brynda Rim PA   If you need a refill on your cardiac medications before your next appointment, please call your pharmacy.

## 2015-06-09 ENCOUNTER — Other Ambulatory Visit: Payer: Self-pay | Admitting: Cardiology

## 2015-06-11 ENCOUNTER — Other Ambulatory Visit: Payer: 59

## 2015-06-16 ENCOUNTER — Other Ambulatory Visit: Payer: Self-pay | Admitting: *Deleted

## 2015-06-16 DIAGNOSIS — I77810 Thoracic aortic ectasia: Secondary | ICD-10-CM

## 2015-06-17 ENCOUNTER — Inpatient Hospital Stay: Admission: RE | Admit: 2015-06-17 | Payer: 59 | Source: Ambulatory Visit

## 2015-06-17 ENCOUNTER — Ambulatory Visit (INDEPENDENT_AMBULATORY_CARE_PROVIDER_SITE_OTHER)
Admission: RE | Admit: 2015-06-17 | Discharge: 2015-06-17 | Disposition: A | Discharge status: Home / Self Care | Payer: 59 | Source: Ambulatory Visit | Attending: Cardiology | Admitting: Cardiology

## 2015-06-17 DIAGNOSIS — I77810 Thoracic aortic ectasia: Secondary | ICD-10-CM | POA: Diagnosis not present

## 2015-06-17 MED ORDER — IOHEXOL 350 MG/ML SOLN
100.0000 mL | Freq: Once | INTRAVENOUS | Status: AC | PRN
  Administered 2015-06-17: 100 mL via INTRAVENOUS

## 2015-06-19 ENCOUNTER — Telehealth: Payer: Self-pay | Admitting: Cardiology

## 2015-06-19 NOTE — Telephone Encounter (Signed)
F/u    Pt returning phone call for CT results.

## 2015-06-19 NOTE — Telephone Encounter (Signed)
-----   Message from Darlin Coco, MD sent at 06/17/2015  5:20 PM EST ----- Please report. CT angio of chest shows little interval change and no evidence of aortic dissection. Continue same meds and careful control of BP.

## 2015-06-19 NOTE — Telephone Encounter (Signed)
Advised patient

## 2015-07-11 ENCOUNTER — Ambulatory Visit (INDEPENDENT_AMBULATORY_CARE_PROVIDER_SITE_OTHER): Payer: 59 | Admitting: Family Medicine

## 2015-07-11 ENCOUNTER — Encounter: Payer: Self-pay | Admitting: Family Medicine

## 2015-07-11 VITALS — BP 128/80 | HR 80 | Temp 98.0°F | Resp 14 | Ht 72.0 in | Wt 214.0 lb

## 2015-07-11 DIAGNOSIS — E785 Hyperlipidemia, unspecified: Secondary | ICD-10-CM | POA: Diagnosis not present

## 2015-07-11 DIAGNOSIS — Z125 Encounter for screening for malignant neoplasm of prostate: Secondary | ICD-10-CM

## 2015-07-11 DIAGNOSIS — Z0000 Encounter for general adult medical examination without abnormal findings: Secondary | ICD-10-CM | POA: Diagnosis not present

## 2015-07-11 LAB — CBC WITH DIFFERENTIAL/PLATELET
Basophils Absolute: 0 10*3/uL (ref 0.0–0.1)
Basophils Relative: 0 % (ref 0–1)
EOS ABS: 0.1 10*3/uL (ref 0.0–0.7)
Eosinophils Relative: 2 % (ref 0–5)
HEMATOCRIT: 43.6 % (ref 39.0–52.0)
Hemoglobin: 14.4 g/dL (ref 13.0–17.0)
LYMPHS ABS: 2.3 10*3/uL (ref 0.7–4.0)
Lymphocytes Relative: 35 % (ref 12–46)
MCH: 26 pg (ref 26.0–34.0)
MCHC: 33 g/dL (ref 30.0–36.0)
MCV: 78.8 fL (ref 78.0–100.0)
MONOS PCT: 6 % (ref 3–12)
MPV: 9.3 fL (ref 8.6–12.4)
Monocytes Absolute: 0.4 10*3/uL (ref 0.1–1.0)
NEUTROS ABS: 3.8 10*3/uL (ref 1.7–7.7)
NEUTROS PCT: 57 % (ref 43–77)
PLATELETS: 244 10*3/uL (ref 150–400)
RBC: 5.53 MIL/uL (ref 4.22–5.81)
RDW: 14.4 % (ref 11.5–15.5)
WBC: 6.7 10*3/uL (ref 4.0–10.5)

## 2015-07-11 LAB — COMPLETE METABOLIC PANEL WITH GFR
ALK PHOS: 60 U/L (ref 40–115)
ALT: 20 U/L (ref 9–46)
AST: 15 U/L (ref 10–35)
Albumin: 4.4 g/dL (ref 3.6–5.1)
BILIRUBIN TOTAL: 0.6 mg/dL (ref 0.2–1.2)
BUN: 14 mg/dL (ref 7–25)
CALCIUM: 9 mg/dL (ref 8.6–10.3)
CO2: 24 mmol/L (ref 20–31)
CREATININE: 0.98 mg/dL (ref 0.70–1.33)
Chloride: 105 mmol/L (ref 98–110)
GFR, Est Non African American: 85 mL/min (ref >=60)
Glucose, Bld: 95 mg/dL (ref 70–99)
Potassium: 4 mmol/L (ref 3.5–5.3)
Sodium: 141 mmol/L (ref 135–146)
TOTAL PROTEIN: 7 g/dL (ref 6.1–8.1)

## 2015-07-11 LAB — LIPID PANEL
CHOLESTEROL: 91 mg/dL — AB (ref 125–200)
HDL: 40 mg/dL (ref >=40)
LDL Cholesterol: 35 mg/dL (ref <130)
TRIGLYCERIDES: 78 mg/dL (ref <150)
Total CHOL/HDL Ratio: 2.3 Ratio (ref <=5.0)
VLDL: 16 mg/dL (ref <30)

## 2015-07-11 NOTE — Progress Notes (Signed)
Subjective:    Patient ID: Lawrence Liu, male    DOB: [birth year], 59 y.o.   MRN: [id number]  HPI Patient is here today for complete physical exam. He is requesting a prostate exam as well as a PSA as 2 of his partners at work have recently been diagnosed with prostate cancer. His last colonoscopy was in 2012.  He is not due again until 2022. Flu shot is up-to-date. Tetanus shot is up-to-date. He is even had a shingles shot through work. He is not due for Prevnar or Pneumovax 23 until 67. Otherwise he is been doing very well with no concerns Past Medical History  Diagnosis Date  . RUQ abdominal pain   . Hypertension   . GERD (gastroesophageal reflux disease)   . Deep vein thrombosis ([place]) 2004    Left deep vein thrombosis of left calf  . History of blood transfusion     as a baby for jaundice  . Anginal pain ([place])   . Alpha galactosidase deficiency     multiple tick bites   Past Surgical History  Procedure Laterality Date  . Shoulder arthroscopy  07/15/2011    Procedure: ARTHROSCOPY SHOULDER;  Surgeon: Metta Clines Supple;  Location: [facility];  Service: Orthopedics;  Laterality: Right;  RIGHT ARTHROSCOPY SUBACROMIAL DECOMPRESSION AND DISTAL CLAVICLE RESECTION  . Tonsillectomy  ~ 1970  . Left heart catheterization with coronary angiogram N/A 10/03/2013    Procedure: LEFT HEART CATHETERIZATION WITH CORONARY ANGIOGRAM;  Surgeon: Jolaine Artist, MD;  Location: [facility] CATH LAB;  Service: Cardiovascular;  Laterality: N/A;   Current Outpatient Prescriptions on File Prior to Visit  Medication Sig Dispense Refill  . amLODipine-benazepril (LOTREL) 5-20 MG capsule Take 1 capsule by mouth daily. 90 capsule 3  . aspirin EC 81 MG tablet Take 81 mg by mouth daily.    Marland Kitchen atorvastatin (LIPITOR) 20 MG tablet TAKE 1 TABLET BY MOUTH DAILY. 90 tablet 3  . CIALIS 20 MG tablet TAKE 1 TABLET BY MOUTH DAILY AS NEEDED FOR ERECTILE DYSFUNCTION. 6 tablet PRN  . metoprolol succinate (TOPROL-XL) 50 MG 24 hr tablet TAKE  1 TABLET BY MOUTH DAILY. TAKE WITH OR IMMEDIATELY FOLLOWING A MEAL. 90 tablet 3   No current facility-administered medications on file prior to visit.   Allergies  Allergen Reactions  . Latex Anaphylaxis  . Penicillins   . Sulfa Antibiotics Other (See Comments)    Hives...only when mixed sulfa and pencillin together   Social History   Social History  . Marital Status: Married    Spouse Name: N/A  . Number of Children: N/A  . Years of Education: N/A   Occupational History  . Not on file.   Social History Main Topics  . Smoking status: Never Smoker   . Smokeless tobacco: Never Used  . Alcohol Use: Yes     Comment: 10/02/2013 "hard apple cider maybe once/month"  . Drug Use: No  . Sexual Activity: Yes   Other Topics Concern  . Not on file   Social History Narrative   Family History  Problem Relation Age of Onset  . Heart disease Father   . Diabetes Father   . Diabetes Mother       Review of Systems  All other systems reviewed and are negative.      Objective:   Physical Exam  Constitutional: He is oriented to person, place, and time. He appears well-developed and well-nourished. No distress.  HENT:  Head: Normocephalic and atraumatic.  Right Ear:  External ear normal.  Left Ear: External ear normal.  Nose: Nose normal.  Mouth/Throat: Oropharynx is clear and moist. No oropharyngeal exudate.  Eyes: Conjunctivae and EOM are normal. Pupils are equal, round, and reactive to light. Right eye exhibits no discharge. Left eye exhibits no discharge. No scleral icterus.  Neck: Normal range of motion. Neck supple. No JVD present. No tracheal deviation present. No thyromegaly present.  Cardiovascular: Normal rate, regular rhythm, normal heart sounds and intact distal pulses.  Exam reveals no gallop and no friction rub.   No murmur heard. Pulmonary/Chest: Effort normal and breath sounds normal. No stridor. No respiratory distress. He has no wheezes. He has no rales. He  exhibits no tenderness.  Abdominal: Soft. Bowel sounds are normal. He exhibits no distension and no mass. There is no tenderness. There is no rebound and no guarding.  Genitourinary: Rectum normal, prostate normal and penis normal.  Musculoskeletal: Normal range of motion. He exhibits no edema.  Lymphadenopathy:    He has no cervical adenopathy.  Neurological: He is alert and oriented to person, place, and time. He has normal reflexes. He displays normal reflexes. No cranial nerve deficit. He exhibits normal muscle tone. Coordination normal.  Skin: Skin is warm. No rash noted. He is not diaphoretic. No erythema. No pallor.  Psychiatric: He has a normal mood and affect. His behavior is normal. Judgment and thought content normal.  Vitals reviewed.         Assessment & Plan:  Routine general medical examination at a health care facility  HLD (hyperlipidemia) - Plan: CBC with Differential/Platelet, COMPLETE METABOLIC PANEL WITH GFR, Lipid panel  Prostate cancer screening - Plan: PSA  Patient's exam today is completely normal. His blood pressure is excellent. I will check a CBC, CMP, fasting lipid panel. I will also check a PSA for prostate cancer screening. His colonoscopy is up-to-date. His immunizations are up-to-date. Regular anticipatory guidance is provided

## 2015-07-12 LAB — PSA: PSA: 1.92 ng/mL (ref <=4.00)

## 2015-07-14 ENCOUNTER — Encounter: Payer: Self-pay | Admitting: Family Medicine

## 2015-09-05 MED FILL — METOPROLOL SUCC ER 50 MG TA: 50 | 90 days supply | Qty: 90 | Fill #2

## 2015-09-09 MED FILL — CIALIS 20 MG TABLET: 20 | 30 days supply | Qty: 6 | Fill #1

## 2015-09-19 DIAGNOSIS — H903 Sensorineural hearing loss, bilateral: Secondary | ICD-10-CM | POA: Diagnosis not present

## 2015-09-25 ENCOUNTER — Encounter: Payer: Self-pay | Admitting: Family Medicine

## 2015-09-29 MED FILL — AMLODIPINE-BENAZEPRIL 5-20: 5-20 | 90 days supply | Qty: 90 | Fill #1

## 2015-09-29 MED FILL — ATORVASTATIN 20 MG TABLET: 20 | 90 days supply | Qty: 90 | Fill #1

## 2015-09-30 DIAGNOSIS — Z961 Presence of intraocular lens: Secondary | ICD-10-CM | POA: Diagnosis not present

## 2015-09-30 DIAGNOSIS — H26493 Other secondary cataract, bilateral: Secondary | ICD-10-CM | POA: Diagnosis not present

## 2015-12-03 NOTE — Progress Notes (Signed)
Cardiology Office Note:    Date:  12/03/2015   ID:  Lawrence Liu, DOB Aug 11, 1955, MRN [id number]  PCP:  Lawrence Fraction, MD  Cardiologist:  Dr. Darlin Liu >>   Electrophysiologist:  n/a  Referring MD: Lawrence Frizzle, MD   Chief Complaint  Patient presents with  . Dilated Aortic Root    Follow up    History of Present Illness:     Lawrence Liu is a 59 y.o. male with a hx of    Past Medical History  Diagnosis Date  . RUQ abdominal pain   . Hypertension   . GERD (gastroesophageal reflux disease)   . Deep vein thrombosis ([place]) 2004    Left deep vein thrombosis of left calf  . History of blood transfusion     as a baby for jaundice  . Anginal pain ([place])   . Alpha galactosidase deficiency     multiple tick bites    Past Surgical History  Procedure Laterality Date  . Shoulder arthroscopy  07/15/2011    Procedure: ARTHROSCOPY SHOULDER;  Surgeon: Lawrence Liu;  Location: [facility];  Service: Orthopedics;  Laterality: Right;  RIGHT ARTHROSCOPY SUBACROMIAL DECOMPRESSION AND DISTAL CLAVICLE RESECTION  . Tonsillectomy  ~ 1970  . Left heart catheterization with coronary angiogram N/A 10/03/2013    Procedure: LEFT HEART CATHETERIZATION WITH CORONARY ANGIOGRAM;  Surgeon: Lawrence Artist, MD;  Location: [facility] CATH LAB;  Service: Cardiovascular;  Laterality: N/A;    Current Medications: Outpatient Prescriptions Prior to Visit  Medication Sig Dispense Refill  . amLODipine-benazepril (LOTREL) 5-20 MG capsule Take 1 capsule by mouth daily. 90 capsule 3  . aspirin EC 81 MG tablet Take 81 mg by mouth daily.    Marland Kitchen atorvastatin (LIPITOR) 20 MG tablet TAKE 1 TABLET BY MOUTH DAILY. 90 tablet 3  . Cholecalciferol (VITAMIN D3) 2000 units TABS Take 1 tablet by mouth daily.    Marland Kitchen CIALIS 20 MG tablet TAKE 1 TABLET BY MOUTH DAILY AS NEEDED FOR ERECTILE DYSFUNCTION. 6 tablet PRN  . co-enzyme Q-10 30 MG capsule Take 100 mg by mouth daily.    . Cyanocobalamin (VITAMIN B-12) 5000 MCG  TBDP Take 1 tablet by mouth daily.    . fexofenadine (ALLEGRA) 180 MG tablet Take 180 mg by mouth daily.    . metoprolol succinate (TOPROL-XL) 50 MG 24 hr tablet TAKE 1 TABLET BY MOUTH DAILY. TAKE WITH OR IMMEDIATELY FOLLOWING A MEAL. 90 tablet 3  . Probiotic Product (PROBIOTIC PO) Take 1 tablet by mouth daily. PROBIOTICS 10    . vitamin E 400 UNIT capsule Take 400 Units by mouth daily.     No facility-administered medications prior to visit.      Allergies:   Latex; Penicillins; and Sulfa antibiotics   Social History   Social History  . Marital Status: Married    Spouse Name: N/A  . Number of Children: N/A  . Years of Education: N/A   Social History Main Topics  . Smoking status: Never Smoker   . Smokeless tobacco: Never Used  . Alcohol Use: Yes     Comment: 10/02/2013 "hard apple cider maybe once/month"  . Drug Use: No  . Sexual Activity: Yes   Other Topics Concern  . Not on file   Social History Narrative     Family History:  The patient's family history includes Diabetes in his father and mother; Heart disease in his father.   ROS:   Please see the history of present illness.  ROS All other systems reviewed and are negative.   Physical Exam:    VS:  There were no vitals taken for this visit.   GEN: Well nourished, well developed, in no acute distress HEENT: normal Neck: no JVD, no masses Cardiac: Normal S1/S2, RRR; no murmurs, rubs, or gallops, no edema;   carotid bruits,   Respiratory:  clear to auscultation bilaterally; no wheezing, rhonchi or rales GI: soft, nontender, nondistended MS: no deformity or atrophy Skin: warm and dry Neuro: No focal deficits  Psych: Alert and oriented x 3, normal affect  Wt Readings from Last 3 Encounters:  07/11/15 214 lb (97.07 kg)  06/06/15 213 lb 12.8 oz (96.979 kg)  11/19/14 215 lb 6.4 oz (97.705 kg)      Studies/Labs Reviewed:     EKG:  EKG is  ordered today.  The ekg ordered today demonstrates   Recent  Labs: 07/11/2015: ALT 20; BUN 14; Creat 0.98; Hemoglobin 14.4; Platelets 244; Potassium 4.0; Sodium 141   Recent Lipid Panel    Component Value Date/Time   CHOL 91* 07/11/2015 1227   TRIG 78 07/11/2015 1227   HDL 40 07/11/2015 1227   CHOLHDL 2.3 07/11/2015 1227   VLDL 16 07/11/2015 1227   LDLCALC 35 07/11/2015 1227    Additional studies/ records that were reviewed today include:    Chest CTA 11/16 IMPRESSION: Maximal diameter of the aorta at the sinus of Valsalva is 5.2 cm. My direct measurement on the prior study yielded 5.1 cm. There has been little interval change. No evidence of dissection.  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 4.7 cm RSV. Trileaflet Aortic valve with no AR findings discussed with Dr Lawrence Liu 2) Normal LV size and function EF 65% 3) Upper limits of normal ascending aortic root diameter 3.8 cm 4) Bovine aortic arch  Echo 3/15 Mild LVH, EF 60%, no RWMA, Ao root 48 mm, RVSP 25 mmHg  LHC 3/15 Left main: Normal LAD: Diffuse 20-30% plaque throughout. Distal vessel is small. No high-grade stenosis. LCX: Ostial 30-40% lesion. Large OM-1 with 30% mid. Small OM-2 and several small PLs which are normal. RCA: Dominant. Large vessel. 30% mid. Diffuse 20-30% distally.  LV-gram done in the RAO projection: Ejection Liu = 55-60% no wall motion abnormalities. Ao root appears mildly dilated.  Assessment: 1. Diffuse mild non-obstructive CAD 2. Normal LV function 3. ? Mild Ao root dilation Plan/Discussion: He has mild diffuse CAD but no high-grade lesions. Will need aggressive risk factor  management with ASA, statin, diet and exercise. Will check echo to assess Ao root size. Can go home later today.   ASSESSMENT:     No diagnosis found.  PLAN:     In order of problems listed above:  1.    Medication Adjustments/Labs and Tests Ordered: Current medicines are reviewed at length with the patient today.  Concerns regarding medicines are outlined above.  Medication changes, Labs and Tests ordered today are outlined in the Patient Instructions noted below. There are no Patient Instructions on file for this visit. Signed, Richardson Dopp, PA-C  12/03/2015 5:26 PM    [facility]  [address], [place], [place]  [address] Phone: [phone]; Fax: [phone]     This encounter was created in error - please disregard.

## 2015-12-04 ENCOUNTER — Encounter: Payer: 59 | Admitting: Physician Assistant

## 2015-12-23 MED FILL — METOPROLOL SUCC ER 50 MG TA: 50 | 90 days supply | Qty: 90 | Fill #3

## 2016-01-01 ENCOUNTER — Encounter: Payer: Self-pay | Admitting: Cardiology

## 2016-01-01 ENCOUNTER — Ambulatory Visit (INDEPENDENT_AMBULATORY_CARE_PROVIDER_SITE_OTHER): Payer: 59 | Admitting: Cardiology

## 2016-01-01 VITALS — BP 120/81 | HR 61 | Ht 73.0 in | Wt 215.4 lb

## 2016-01-01 DIAGNOSIS — I77810 Thoracic aortic ectasia: Secondary | ICD-10-CM | POA: Diagnosis not present

## 2016-01-01 DIAGNOSIS — Z01818 Encounter for other preprocedural examination: Secondary | ICD-10-CM

## 2016-01-01 DIAGNOSIS — I493 Ventricular premature depolarization: Secondary | ICD-10-CM

## 2016-01-01 DIAGNOSIS — Z79899 Other long term (current) drug therapy: Secondary | ICD-10-CM

## 2016-01-01 DIAGNOSIS — R0789 Other chest pain: Secondary | ICD-10-CM

## 2016-01-01 NOTE — Patient Instructions (Signed)
SCHEDULE FOR NOV 2017- FOLLOW UP AORTIC ROOTNon-Cardiac CT Angiography (CTA), is a special type of CT scan that uses a computer to produce multi-dimensional views of major blood vessels throughout the body. In CT angiography, a contrast material is injected through an IV to help visualize the blood vessels YOU WILL NEED LABS - BMP  BEFORE YOU HAVE CT SCAN DONE IN NOV 2017   NO CHANGES WITH CURRENT MEDICATIONS AT PRESENT TIME  Your physician wants you to follow-up in Nov 2017 with Dr Ellyn Hack after ct scan is completed  You will receive a reminder letter in the mail two months in advance. If you don't receive a letter, please call our office to schedule the follow-up appointment.  If you need a refill on your cardiac medications before your next appointment, please call your pharmacy.

## 2016-01-01 NOTE — Progress Notes (Signed)
PCP: Lawrence Fraction, MD  Clinic Note: Chief Complaint  Patient presents with  . Follow-up    Aortic root dilation    HPI: Lawrence Liu is a 60 y.o. male with a PMH below who presents today for Routine 6 month follow-up of dilated aortic root He is a former patient of Dr. Mare Ferrari who we was following for episodes of chest tightness thought to be potentially anginal equivalent. He was hospitalized in March 2015 visit chest tightness. Echocardiogram with normal EF of 60%, but noted to have a dilated aortic root with no AI. Cardiac catheterization showed only mild coronary disease.Lawrence Liu was last seen in June 04, 2015 - had a CT angiogram ordered at that time.  Recent Hospitalizations: None  Studies Reviewed:   CTA of chest 06-04-15: aorta at sinus of Valsalva/sinotubular junction/ascending aorta are 5.2 cm, 4.0 cm, 3.7 cm -> little change from prior study  Echo 09/2013: Reviewed in [facility]  Cardiac cath 09/2013: Left main: Normal; LAD: Diffuse 20-30% plaque throughout. Distal vessel is small. No high-grade stenosis; LCX: Ostial 30-40% lesion. Large OM-1 with 30% mid. Small OM-2 and several small PLs which are normal; RCA: Dominant. Large vessel. 30% mid. Diffuse 20-30% distally.   Interval History: Lawrence Liu presents today for routine 6 month follow-up and doing fairly well. He played 18 holes of golf yesterday, walking most of the way without any symptoms of chest tightness, pressure or dyspnea. For the most part he denies any resting or exertional chest tightness/pressure or dyspnea. He also denies any heart failure symptoms of PND, orthopnea or edema.   Cardiac Review of Symptoms as follows:  No palpitations, lightheadedness, dizziness, weakness or syncope/near syncope.  No TIA/amaurosis fugax symptoms.  No claudication.  ROS: A comprehensive was performed. Review of Systems  Constitutional: Negative for malaise/fatigue.  HENT: Negative for  nosebleeds.   Eyes: Negative for blurred vision and double vision.  Respiratory: Negative for cough, shortness of breath and wheezing.   Cardiovascular:       Per history of present illness. Negative  Gastrointestinal: Positive for heartburn (Takes ranitidine). Negative for blood in stool and melena.  Genitourinary: Negative for hematuria.  Musculoskeletal: Positive for joint pain (Arthritis pains). Negative for myalgias.  Neurological: Negative for dizziness, loss of consciousness and headaches.  Endo/Heme/Allergies: Does not bruise/bleed easily.  Psychiatric/Behavioral: Negative for depression and memory loss. The patient is not nervous/anxious and does not have insomnia.   All other systems reviewed and are negative.   Past Medical History  Diagnosis Date  . RUQ abdominal pain   . Hypertension   . GERD (gastroesophageal reflux disease)   . Deep vein thrombosis ([place]) 2004    Left deep vein thrombosis of left calf  . History of blood transfusion     as a baby for jaundice  . Anginal pain ([place])   . Alpha galactosidase deficiency     multiple tick bites    Past Surgical History  Procedure Laterality Date  . Shoulder arthroscopy  07/15/2011    Procedure: ARTHROSCOPY SHOULDER;  Surgeon: Metta Clines Supple;  Location: [place];  Service: Orthopedics;  Laterality: Right;  RIGHT ARTHROSCOPY SUBACROMIAL DECOMPRESSION AND DISTAL CLAVICLE RESECTION  . Tonsillectomy  ~ 1970  . Left heart catheterization with coronary angiogram N/A 10/03/2013    Procedure: LEFT HEART CATHETERIZATION WITH CORONARY ANGIOGRAM;  Surgeon: Jolaine Artist, MD;  Location: [facility] CATH LAB;  Service: Cardiovascular:  Nonobstructive disease  . Transthoracic echocardiogram  March 2015  Upper limit size LV. Mild LVH. EF 60%. Normal wall motion. Ascending aorta measures 48 mm at root tapering to 40 mm at proximal ascending. Mild RV dilation.    Prior to Admission medications   Medication Sig Start Date End Date Taking?  Authorizing Provider  amLODipine-benazepril (LOTREL) 5-20 MG capsule Take 1 capsule by mouth daily. 06/06/15  Yes Darlin Coco, MD  aspirin EC 81 MG tablet Take 81 mg by mouth daily.   Yes Historical Provider, MD  atorvastatin (LIPITOR) 20 MG tablet TAKE 1 TABLET BY MOUTH DAILY. 06/10/15  Yes Darlin Coco, MD  Cholecalciferol (VITAMIN D3) 2000 units TABS Take 1 tablet by mouth daily.   Yes Historical Provider, MD  CIALIS 20 MG tablet TAKE 1 TABLET BY MOUTH DAILY AS NEEDED FOR ERECTILE DYSFUNCTION. 09/26/14  Yes [name], MD  co-enzyme Q-10 30 MG capsule Take 300 mg by mouth daily.    Yes Historical Provider, MD  Cyanocobalamin (VITAMIN B-12) 5000 MCG TBDP Take 1 tablet by mouth daily.   Yes Historical Provider, MD  fexofenadine (ALLEGRA) 180 MG tablet Take 180 mg by mouth daily.   Yes Historical Provider, MD  Glucosamine-Chondroit-Vit C-Mn (GLUCOSAMINE 1500 COMPLEX PO) Take 1,500 mg by mouth daily.   Yes Historical Provider, MD  metoprolol succinate (TOPROL-XL) 50 MG 24 hr tablet TAKE 1 TABLET BY MOUTH DAILY. TAKE WITH OR IMMEDIATELY FOLLOWING A MEAL. 01/28/15  Yes [name], MD  Probiotic Product (PROBIOTIC PO) Take 1 tablet by mouth daily. PROBIOTICS 10   Yes Historical Provider, MD  ranitidine (ZANTAC) 150 MG capsule Take 300 mg by mouth every evening.   Yes Historical Provider, MD  vitamin E 400 UNIT capsule Take 400 Units by mouth 2 (two) times daily.    Yes Historical Provider, MD   Allergies  Allergen Reactions  . Latex Anaphylaxis  . Penicillins   . Sulfa Antibiotics Other (See Comments)    Hives...only when mixed sulfa and pencillin together     Social History   Social History  . Marital Status: Married    Spouse Name: N/A  . Number of Children: N/A  . Years of Education: N/A   Social History Main Topics  . Smoking status: Never Smoker   . Smokeless tobacco: Never Used  . Alcohol Use: Yes     Comment: 10/02/2013 "hard apple cider maybe once/month"  . Drug  Use: No  . Sexual Activity: Yes   Other Topics Concern  . None   Social History Narrative   family history includes Diabetes in his father and mother; Heart disease in his father.   Wt Readings from Last 3 Encounters:  01/01/16 215 lb 6.4 oz (97.705 kg)  07/11/15 214 lb (97.07 kg)  06/06/15 213 lb 12.8 oz (96.979 kg)    PHYSICAL EXAM BP 120/81 mmHg  Pulse 61  Ht 6\' 1"  (1.854 m)  Wt 215 lb 6.4 oz (97.705 kg)  BMI 28.42 kg/m2 General appearance: alert, cooperative, appears stated age, no distress and Healthy-appearing/well-nourished well-groomed Neck: no adenopathy, no carotid bruit and no JVD Lungs: clear to auscultation bilaterally, normal percussion bilaterally and non-labored Heart: regular rate and rhythm, S1, S2 normal, no murmur, click, rub or gallop; nondisplaced PMI Abdomen: soft, non-tender; bowel sounds normal; no masses,  no organomegaly; no HJR Extremities: extremities normal, atraumatic, no cyanosis or edema  Pulses: 2+ and symmetric;  Skin: mobility and turgor normal, no edema and no evidence of bleeding or bruising  Neurologic: Mental status: Alert, oriented, thought  content appropriate Cranial nerves: normal (II-XII grossly intact)    Adult ECG Report  Rate: 61 ;  Rhythm: normal sinus rhythm and Normal axis, intervals and durations.;   Narrative Interpretation: Normal/stable EKG   Other studies Reviewed: Additional studies/ records that were reviewed today include:  Recent Labs:   Lab Results  Component Value Date   CHOL 91* 07/11/2015   HDL 40 07/11/2015   LDLCALC 35 07/11/2015   TRIG 78 07/11/2015   CHOLHDL 2.3 07/11/2015   Lab Results  Component Value Date   CREATININE 0.98 07/11/2015    ASSESSMENT / PLAN: Problem List Items Addressed This Visit    PVC's (premature ventricular contractions)    Has a history of PVCs in the past. He doesn't feel any palpitations. No PVCs noted today. Continue current dose of beta blocker.      Relevant  Orders   EKG 12-Lead   CT ANGIO CHEST AORTA W/CM &/OR WO/CM   Chest tightness    No further episodes or symptoms. He had a relatively normal cardiac catheterization a couple years ago. No need for stress test for further evaluation.      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 Did not have significant AI on echo. He is due for annual follow-up CT scan of the chest in November December timeframe. Depending on the stability of that study, we may or may not order echocardiogram prior to next visit. -- With planned contrasted CT, he will need to have BMP checked  He is on stable dose of beta blocker and ACE inhibitor. Not requiring any diuretic.      Relevant Orders   EKG 12-Lead   CT ANGIO CHEST AORTA W/CM &/OR WO/CM    Other Visit Diagnoses    Pre-op testing        Relevant Orders    Basic metabolic panel    Drug therapy        Relevant Orders    Basic metabolic panel       Current medicines are reviewed at length with the patient today. (+/- concerns) none The following changes have been made: None Studies Ordered:   Orders Placed This Encounter  Procedures  . CT ANGIO CHEST AORTA W/CM &/OR WO/CM  . Basic metabolic panel  . EKG 12-Lead   F/U Nov 2017 with Dr Ellyn Hack after ct scan is completed    Glenetta Hew, M.D., M.S. Interventional Cardiologist   Pager # [phone] Phone # [phone] [address] [place], [place] [address]

## 2016-01-03 ENCOUNTER — Encounter: Payer: Self-pay | Admitting: Cardiology

## 2016-01-03 NOTE — Assessment & Plan Note (Addendum)
Still no sound of AI on exam. Did not have significant AI on echo. He is due for annual follow-up CT scan of the chest in November December timeframe. Depending on the stability of that study, we may or may not order echocardiogram prior to next visit. -- With planned contrasted CT, he will need to have BMP checked  He is on stable dose of beta blocker and ACE inhibitor. Not requiring any diuretic.

## 2016-01-03 NOTE — Assessment & Plan Note (Signed)
No further episodes or symptoms. He had a relatively normal cardiac catheterization a couple years ago. No need for stress test for further evaluation.

## 2016-01-03 NOTE — Assessment & Plan Note (Signed)
Has a history of PVCs in the past. He doesn't feel any palpitations. No PVCs noted today. Continue current dose of beta blocker.

## 2016-01-20 MED FILL — ATORVASTATIN 20 MG TABLET: 20 | 90 days supply | Qty: 90 | Fill #2

## 2016-01-20 MED FILL — AMLODIPINE-BENAZEPRIL 5-20: 5-20 | 90 days supply | Qty: 90 | Fill #2

## 2016-04-07 ENCOUNTER — Other Ambulatory Visit: Payer: Self-pay | Admitting: Family Medicine

## 2016-04-07 MED FILL — METOPROLOL SUCC ER 50 MG TA: 50 | 90 days supply | Qty: 90 | Fill #0

## 2016-05-04 MED FILL — ATORVASTATIN 20 MG TABLET: 20 | 90 days supply | Qty: 90 | Fill #3

## 2016-05-04 MED FILL — AMLODIPINE-BENAZEPRIL 5-20: 5-20 | 90 days supply | Qty: 90 | Fill #3

## 2016-05-17 ENCOUNTER — Telehealth: Payer: Self-pay | Admitting: *Deleted

## 2016-05-17 DIAGNOSIS — Z79899 Other long term (current) drug therapy: Secondary | ICD-10-CM

## 2016-05-17 DIAGNOSIS — Z01818 Encounter for other preprocedural examination: Secondary | ICD-10-CM

## 2016-05-17 NOTE — Telephone Encounter (Signed)
-----   Message from Raiford Simmonds, RN sent at 01/01/2016 11:43 AM EDT ----- NEEDS BMP FOR UPCOMING CT SCAN AORTIC ROOT

## 2016-05-17 NOTE — Telephone Encounter (Signed)
MAILED LETTER -FOR LAB --CT ANGIO CHEST AORTA

## 2016-05-18 ENCOUNTER — Telehealth: Payer: Self-pay | Admitting: *Deleted

## 2016-05-18 NOTE — Telephone Encounter (Signed)
Left message for patient to call and schedule CTA

## 2016-06-11 DIAGNOSIS — Z01818 Encounter for other preprocedural examination: Secondary | ICD-10-CM | POA: Diagnosis not present

## 2016-06-11 DIAGNOSIS — Z79899 Other long term (current) drug therapy: Secondary | ICD-10-CM | POA: Diagnosis not present

## 2016-06-12 LAB — BASIC METABOLIC PANEL
BUN: 17 mg/dL (ref 7–25)
CALCIUM: 9 mg/dL (ref 8.6–10.3)
CO2: 27 mmol/L (ref 20–31)
CREATININE: 0.88 mg/dL (ref 0.70–1.33)
Chloride: 107 mmol/L (ref 98–110)
Glucose, Bld: 95 mg/dL (ref 65–99)
Potassium: 4.7 mmol/L (ref 3.5–5.3)
SODIUM: 141 mmol/L (ref 135–146)

## 2016-06-22 ENCOUNTER — Ambulatory Visit (INDEPENDENT_AMBULATORY_CARE_PROVIDER_SITE_OTHER)
Admission: RE | Admit: 2016-06-22 | Discharge: 2016-06-22 | Disposition: A | Discharge status: Home / Self Care | Payer: 59 | Source: Ambulatory Visit | Attending: Cardiology | Admitting: Cardiology

## 2016-06-22 DIAGNOSIS — I493 Ventricular premature depolarization: Secondary | ICD-10-CM | POA: Diagnosis not present

## 2016-06-22 DIAGNOSIS — I77810 Thoracic aortic ectasia: Secondary | ICD-10-CM | POA: Diagnosis not present

## 2016-06-22 DIAGNOSIS — I712 Thoracic aortic aneurysm, without rupture: Secondary | ICD-10-CM | POA: Diagnosis not present

## 2016-06-22 MED ORDER — IOPAMIDOL (ISOVUE-370) INJECTION 76%
100.0000 mL | Freq: Once | INTRAVENOUS | Status: AC | PRN
  Administered 2016-06-22: 100 mL via INTRAVENOUS

## 2016-08-02 DIAGNOSIS — Z8679 Personal history of other diseases of the circulatory system: Secondary | ICD-10-CM

## 2016-08-02 HISTORY — DX: Personal history of other diseases of the circulatory system: Z86.79

## 2016-08-09 MED FILL — METOPROLOL SUCC ER 50 MG TA: 50 | 90 days supply | Qty: 90 | Fill #1

## 2016-09-03 ENCOUNTER — Other Ambulatory Visit: Payer: Self-pay

## 2016-09-03 MED ORDER — AMLODIPINE BESY-BENAZEPRIL HCL 5-20 MG PO CAPS
1.0000 | ORAL_CAPSULE | Freq: Every day | ORAL | 0 refills | Status: DC
Start: 2016-09-03 — End: 2016-12-16

## 2016-09-03 MED FILL — AMLODIPINE-BENAZEPRIL 5-20: 5-20 | 90 days supply | Qty: 90 | Fill #0

## 2016-10-01 ENCOUNTER — Ambulatory Visit (INDEPENDENT_AMBULATORY_CARE_PROVIDER_SITE_OTHER): Payer: 59 | Admitting: Cardiology

## 2016-10-01 ENCOUNTER — Encounter: Payer: Self-pay | Admitting: Cardiology

## 2016-10-01 VITALS — BP 128/76 | HR 80 | Ht 72.0 in | Wt 208.0 lb

## 2016-10-01 DIAGNOSIS — I10 Essential (primary) hypertension: Secondary | ICD-10-CM

## 2016-10-01 DIAGNOSIS — E784 Other hyperlipidemia: Secondary | ICD-10-CM

## 2016-10-01 DIAGNOSIS — Z79899 Other long term (current) drug therapy: Secondary | ICD-10-CM

## 2016-10-01 DIAGNOSIS — E785 Hyperlipidemia, unspecified: Secondary | ICD-10-CM

## 2016-10-01 DIAGNOSIS — I77810 Thoracic aortic ectasia: Secondary | ICD-10-CM

## 2016-10-01 MED ORDER — ATORVASTATIN CALCIUM 20 MG PO TABS: 20.0000 mg | ORAL_TABLET | Freq: Every day | ORAL | 3 refills | Status: DC

## 2016-10-01 MED FILL — ATORVASTATIN 20 MG TABLET: 20 | 90 days supply | Qty: 90 | Fill #0

## 2016-10-01 NOTE — Patient Instructions (Signed)
Please have labs done-CMP and Lipids Have done at [facility] at [address]  Your physician has requested that you have an echocardiogram. Echocardiography is a painless test that uses sound waves to create images of your heart. It provides your doctor with information about the size and shape of your heart and how well your heart's chambers and valves are working. This procedure takes approximately one hour. There are no restrictions for this procedure. At [address]   You have been referred to Dr. Cyndia Bent for an initial evaluation   Your physician wants you to follow-up in: 6 months with Dr. Ellyn Hack. You will receive a reminder letter in the mail two months in advance. If you don't receive a letter, please call our office to schedule the follow-up appointment.

## 2016-10-01 NOTE — Progress Notes (Signed)
PCP: Odette Fraction, MD  Clinic Note: Chief Complaint  Patient presents with  . Follow-up    F/U after angio chest  . Shortness of Breath    HPI: Lawrence Liu is a 61 y.o. male with a PMH below who presents today for Routine 6 month follow-up of dilated aortic root He is a former patient of Dr. Mare Ferrari who we was following for episodes of chest tightness thought to be potentially anginal equivalent. He was hospitalized in March 2015 visit chest tightness. Echocardiogram with normal EF of 60%, but noted to have a dilated aortic root with no AI. Cardiac catheterization showed only mild coronary disease.[name] was last seen in June 2017- had a CT angiogram ordered at that time with planned follow-up now.  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 Max diameters 5.5 cm- identical to 2015  Interval History: Lawrence Liu presents today for Delayed 6-8 month follow-up and doing fairly well. He continues to be active both at work and with trying to exercise. He walks his dog almost every day. He does play golf, and is trying to pay more attention to walking most of the way.  With this walking, he denies any symptoms of chest tightness, pressure or dyspnea. For the most part he denies any resting or exertional chest tightness/pressure or dyspnea. If he exerts himself a lot, he may note some dyspnea, but nothing that is all limiting. He also denies any heart failure symptoms of PND, orthopnea or edema.   Cardiac Review of Symptoms as follows:  No palpitations, lightheadedness, dizziness, weakness or syncope/near syncope.  No TIA/amaurosis fugax symptoms.  No claudication.  ROS: A comprehensive was performed. Review of Systems  Constitutional: Negative for malaise/fatigue.  HENT: Negative for nosebleeds.   Eyes: Negative for blurred  vision and double vision.  Respiratory: Negative for cough, shortness of breath and wheezing.   Cardiovascular:       Per history of present illness. Negative  Gastrointestinal: Positive for heartburn (Takes ranitidine). Negative for blood in stool and melena.  Genitourinary: Negative for hematuria.  Musculoskeletal: Positive for joint pain (Arthritis pains). Negative for myalgias.  Neurological: Negative for dizziness, loss of consciousness and headaches.  Endo/Heme/Allergies: Does not bruise/bleed easily.  Psychiatric/Behavioral: Negative for depression and memory loss. The patient is not nervous/anxious and does not have insomnia.   All other systems reviewed and are negative.   Past Medical History:  Diagnosis Date  . Alpha galactosidase deficiency    multiple tick bites  . Aortic root dilatation ([place]) 11/06/2013   CTA of chest November 2016: aorta at sinus of Valsalva/sinotubular junction/ascending aorta are 5.2 cm, 4.0 cm, 3.7 cm -> little change from prior study  . Deep vein thrombosis ([place]) 2004   Left deep vein thrombosis of left calf  . GERD (gastroesophageal reflux disease)   . History of blood transfusion    as a baby for jaundice  . Hypertension   . 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 No lesions greater than 30% noted.  . RUQ abdominal pain     Past Surgical History:  Procedure Laterality Date  . LEFT HEART CATHETERIZATION WITH CORONARY ANGIOGRAM N/A 10/03/2013   Procedure: LEFT HEART CATHETERIZATION WITH CORONARY ANGIOGRAM;  Surgeon: Jolaine Artist, MD;  Location: [facility] CATH LAB;  Service: Cardiovascular:  Nonobstructive disease - LAD  diffuse 20-30%. Small distal vessel. Circumflex ostial 30-40%. Large OM 1 with 30% mid. Small OM 2 and several small PL branches. Large dominant RCA with 30% mid and diffuse 20-30% distal.  . SHOULDER ARTHROSCOPY  07/15/2011   Procedure: ARTHROSCOPY SHOULDER;  Surgeon: Metta Clines Supple;  Location:  [place];  Service: Orthopedics;  Laterality: Right;  RIGHT ARTHROSCOPY SUBACROMIAL DECOMPRESSION AND DISTAL CLAVICLE RESECTION  . TONSILLECTOMY  ~ 1970  . TRANSTHORACIC ECHOCARDIOGRAM  March 2015   Upper limit size LV. Mild LVH. EF 60%. Normal wall motion. Ascending aorta measures 48 mm at root tapering to 40 mm at proximal ascending. Mild RV dilation.    Current Meds  Medication Sig  . amLODipine-benazepril (LOTREL) 5-20 MG capsule Take 1 capsule by mouth daily.  Marland Kitchen aspirin EC 81 MG tablet Take 81 mg by mouth daily.  Marland Kitchen atorvastatin (LIPITOR) 20 MG tablet Take 1 tablet (20 mg total) by mouth daily.  . Cholecalciferol (VITAMIN D3) 2000 units TABS Take 1 tablet by mouth daily.  Marland Kitchen CIALIS 20 MG tablet TAKE 1 TABLET BY MOUTH DAILY AS NEEDED FOR ERECTILE DYSFUNCTION.  Marland Kitchen co-enzyme Q-10 30 MG capsule Take 300 mg by mouth daily.   . Cyanocobalamin (VITAMIN B-12) 5000 MCG TBDP Take 1 tablet by mouth daily.  . fexofenadine (ALLEGRA) 180 MG tablet Take 180 mg by mouth daily.  . Glucosamine-Chondroit-Vit C-Mn (GLUCOSAMINE 1500 COMPLEX PO) Take 1,500 mg by mouth daily.  . metoprolol succinate (TOPROL-XL) 50 MG 24 hr tablet TAKE 1 TABLET BY MOUTH DAILY. TAKE WITH OR IMMEDIATELY FOLLOWING A MEAL.  . Probiotic Product (PROBIOTIC PO) Take 1 tablet by mouth daily. PROBIOTICS 10  . ranitidine (ZANTAC) 150 MG capsule Take 300 mg by mouth every evening.  . vitamin E 400 UNIT capsule Take 400 Units by mouth 2 (two) times daily.   . [DISCONTINUED] atorvastatin (LIPITOR) 20 MG tablet TAKE 1 TABLET BY MOUTH DAILY.    Allergies  Allergen Reactions  . Latex Anaphylaxis  . Penicillins   . Sulfa Antibiotics Other (See Comments)    Hives...only when mixed sulfa and pencillin together     Social History   Social History  . Marital status: Married    Spouse name: N/A  . Number of children: N/A  . Years of education: N/A   Social History Main Topics  . Smoking status: Never Smoker  . Smokeless tobacco: Never  Used  . Alcohol use Yes     Comment: 10/02/2013 "hard apple cider maybe once/month"  . Drug use: No  . Sexual activity: Yes   Other Topics Concern  . None   Social History Narrative  . None   family history includes Diabetes in his father and mother; Heart disease in his father.   Wt Readings from Last 3 Encounters:  10/01/16 94.3 kg (208 lb)  01/01/16 97.7 kg (215 lb 6.4 oz)  07/11/15 97.1 kg (214 lb)    PHYSICAL EXAM BP 128/76   Pulse 80   Ht 6' (1.829 m)   Wt 94.3 kg (208 lb)   BMI 28.21 kg/m  General appearance: alert, cooperative, appears stated age, no distress and Healthy-appearing/well-nourished well-groomed Neck: no adenopathy, no carotid bruit and no JVD Lungs: clear to auscultation bilaterally, normal percussion bilaterally and non-labored Heart: regular rate and rhythm, S1& S2 normal, no murmur, click, rub or gallop; nondisplaced PMI Abdomen: soft, non-tender; bowel sounds normal; no masses,  no organomegaly; no HJR Extremities: extremities normal, atraumatic, no cyanosis or edema  Pulses: 2+  and symmetric;  Skin: mobility and turgor normal, no edema and no evidence of bleeding or bruising  Neurologic: Mental status: Alert, oriented, thought content appropriate; nonfocal    Adult ECG Report Not checked   Other studies Reviewed: Additional studies/ records that were reviewed today include:  Recent Labs:   Lab Results  Component Value Date   CHOL 91 (L) 07/11/2015   HDL 40 07/11/2015   LDLCALC 35 07/11/2015   TRIG 78 07/11/2015   CHOLHDL 2.3 07/11/2015   Lab Results  Component Value Date   CREATININE 0.88 06/11/2016    ASSESSMENT / PLAN: Problem List Items Addressed This Visit    Aortic root dilatation ([place]) - Primary (Chronic)    Interestingly, his repeat CT scan from November 2017 indicates that the maximum diameter of the ascending aorta is 5.5 cm, however the vertigo indicates that it is identical to 2015. This is not accurate in the sense  that his appointment 0.3 cm growth in maximal diameter. And 0.6 in the last 2 years. I don't hear any aortic stenosis or regurgitation on exam.  At this point I think it warrants surgical consultation to determine if our next CT scan will be 6 months from now or in a year. This will also help determine timing of potentially could surgical consideration. Also continue aggressive cardiovascular risk factor modification with blood pressure control and lipid control. Is not diabetic.  Plan: Referred to CT surgery; check 2-D echocardiogram to ensure no valvular disease and even potentially functional.      Relevant Medications   atorvastatin (LIPITOR) 20 MG tablet   Other Relevant Orders   ECHOCARDIOGRAM COMPLETE   Ambulatory referral to Cardiothoracic Surgery   Atherogenic dyslipidemia (Chronic)    I don't see that he has had labs checked in a while. We will recheck a lipid panel. For now we will continue current dose of atorvastatin and refill.      Relevant Medications   atorvastatin (LIPITOR) 20 MG tablet   Other Relevant Orders   ECHOCARDIOGRAM COMPLETE   Ambulatory referral to Cardiothoracic Surgery   Comprehensive metabolic panel   Lipid panel   Essential hypertension (Chronic)    Fairly well controlled on Lotrel and Toprol. Dr. the importance of relatively aggressive blood pressure control and the use of beta blocker.      Relevant Medications   atorvastatin (LIPITOR) 20 MG tablet   Other Relevant Orders   ECHOCARDIOGRAM COMPLETE   Ambulatory referral to Cardiothoracic Surgery    Other Visit Diagnoses    Medication management       Relevant Orders   Comprehensive metabolic panel   Lipid panel      Current medicines are reviewed at length with the patient today. (+/- concerns) none The following changes have been made: None Studies Ordered:   Orders Placed This Encounter  Procedures  . Comprehensive metabolic panel  . Lipid panel  . Ambulatory referral to  Cardiothoracic Surgery  . ECHOCARDIOGRAM COMPLETE   F/U Nov 2017 with Dr Ellyn Hack after ct scan is completed    Glenetta Hew, M.D., M.S. Interventional Cardiologist   Pager # [phone] Phone # [phone] [address] [place], [place] [address]

## 2016-10-03 ENCOUNTER — Encounter: Payer: Self-pay | Admitting: Cardiology

## 2016-10-03 NOTE — Assessment & Plan Note (Signed)
Fairly well controlled on Lotrel and Toprol. Dr. the importance of relatively aggressive blood pressure control and the use of beta blocker.

## 2016-10-03 NOTE — Assessment & Plan Note (Addendum)
Interestingly, his repeat CT scan from November 2017 indicates that the maximum diameter of the ascending aorta is 5.5 cm, however the vertigo indicates that it is identical to 2015. This is not accurate in the sense that his appointment 0.3 cm growth in maximal diameter. And 0.6 in the last 2 years. I don't hear any aortic stenosis or regurgitation on exam.  At this point I think it warrants surgical consultation to determine if our next CT scan will be 6 months from now or in a year. This will also help determine timing of potentially could surgical consideration. Also continue aggressive cardiovascular risk factor modification with blood pressure control and lipid control. Is not diabetic.  Plan: Referred to CT surgery; check 2-D echocardiogram to ensure no valvular disease and even potentially functional.

## 2016-10-03 NOTE — Assessment & Plan Note (Signed)
I don't see that he has had labs checked in a while. We will recheck a lipid panel. For now we will continue current dose of atorvastatin and refill.

## 2016-10-06 ENCOUNTER — Encounter: Payer: Self-pay | Admitting: Surgery

## 2016-10-06 ENCOUNTER — Institutional Professional Consult (permissible substitution) (INDEPENDENT_AMBULATORY_CARE_PROVIDER_SITE_OTHER): Payer: 59 | Admitting: Surgery

## 2016-10-06 VITALS — BP 121/79 | HR 66 | Resp 20 | Ht 72.0 in | Wt 208.0 lb

## 2016-10-06 DIAGNOSIS — I77810 Thoracic aortic ectasia: Secondary | ICD-10-CM | POA: Diagnosis not present

## 2016-10-09 ENCOUNTER — Encounter: Payer: Self-pay | Admitting: Surgery

## 2016-10-09 NOTE — Progress Notes (Signed)
Cardiothoracic Surgery Consultation   PCP is Odette Fraction, MD Referring Provider is Leonie Man, MD  Chief Complaint  Patient presents with  . Thoracic Aortic Dissection    Surgical eval, CTA Chest 06/22/16. ECHO 10/01/16    HPI:  The patient is a 61 year old gentleman who is a Marine scientist for [employer] who has a long history of hypertension, mild non-obstructive coronary disease by cath in 09/2013 and a history of aortic root aneurysm. He had been followed by Dr. Mare Ferrari in the past and underwent cardiac workup in 09/2013 after he presented with some chest tightness. An echo showed normal LV function and aortic root dilation to 48 mm with no AI. A cardiac MR showed an aortic root aneurysm with a diameter of 4.7 cm through the right sinus of valsalva with an ascending aorta measuring 3.8 cm. The aortic valve was trileaflet with no AI. There was bovine arch anatomy with an arch diameter of 2.9 cm. A cardiac CT on 05/10/2014 showed an aortic root diameter at the sinus of right sinus of valsalva of 4.7 cm that was unchanged. He had a CTA of the chest on 06/17/2015 which was read as showing a sinus dimeter of 5.2 cm but the radiologist did not see much change from the prior cardiac CT. His most recent CTA of the chest on 06/22/2016 showed stable aneurysmal dilation of the aortic root but the radiologist thought that the diameter at the sinus level may be as large as 5.5 cm.  He denies any chest or back pain. His wife is with him today and she is an Restaurant manager, fast food. There is no family history of aortic aneurysm or dissection.  Past Medical History:  Diagnosis Date  . Alpha galactosidase deficiency    multiple tick bites  . Aortic root dilatation ([place]) 11/06/2013   CTA of chest November 2016: aorta at sinus of Valsalva/sinotubular junction/ascending aorta are 5.2 cm, 4.0 cm, 3.7 cm -> little change from prior study  . Deep vein thrombosis ([place]) 2004   Left deep vein thrombosis of left  calf  . GERD (gastroesophageal reflux disease)   . History of blood transfusion    as a baby for jaundice  . Hypertension   . 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 No lesions greater than 30% noted.  . RUQ abdominal pain     Past Surgical History:  Procedure Laterality Date  . LEFT HEART CATHETERIZATION WITH CORONARY ANGIOGRAM N/A 10/03/2013   Procedure: LEFT HEART CATHETERIZATION WITH CORONARY ANGIOGRAM;  Surgeon: Jolaine Artist, MD;  Location: [facility] CATH LAB;  Service: Cardiovascular:  Nonobstructive disease - LAD diffuse 20-30%. Small distal vessel. Circumflex ostial 30-40%. Large OM 1 with 30% mid. Small OM 2 and several small PL branches. Large dominant RCA with 30% mid and diffuse 20-30% distal.  . SHOULDER ARTHROSCOPY  07/15/2011   Procedure: ARTHROSCOPY SHOULDER;  Surgeon: Metta Clines Supple;  Location: [place];  Service: Orthopedics;  Laterality: Right;  RIGHT ARTHROSCOPY SUBACROMIAL DECOMPRESSION AND DISTAL CLAVICLE RESECTION  . TONSILLECTOMY  ~ 1970  . TRANSTHORACIC ECHOCARDIOGRAM  March 2015   Upper limit size LV. Mild LVH. EF 60%. Normal wall motion. Ascending aorta measures 48 mm at root tapering to 40 mm at proximal ascending. Mild RV dilation.    Family History  Problem Relation Age of Onset  . Heart disease Father   . Diabetes Father   . Diabetes Mother     Social History Social  History  Substance Use Topics  . Smoking status: Never Smoker  . Smokeless tobacco: Never Used  . Alcohol use Yes     Comment: 10/02/2013 "hard apple cider maybe once/month"    Current Outpatient Prescriptions  Medication Sig Dispense Refill  . amLODipine-benazepril (LOTREL) 5-20 MG capsule Take 1 capsule by mouth daily. 90 capsule 0  . aspirin EC 81 MG tablet Take 81 mg by mouth daily.    Marland Kitchen atorvastatin (LIPITOR) 20 MG tablet Take 1 tablet (20 mg total) by mouth daily. 90 tablet 3  . Cholecalciferol (VITAMIN D3) 2000 units TABS Take 1 tablet by  mouth daily.    Marland Kitchen CIALIS 20 MG tablet TAKE 1 TABLET BY MOUTH DAILY AS NEEDED FOR ERECTILE DYSFUNCTION. 6 tablet PRN  . co-enzyme Q-10 30 MG capsule Take 300 mg by mouth daily.     . Cyanocobalamin (VITAMIN B-12) 5000 MCG TBDP Take 1 tablet by mouth daily.    . fexofenadine (ALLEGRA) 180 MG tablet Take 180 mg by mouth daily.    . Glucosamine-Chondroit-Vit C-Mn (GLUCOSAMINE 1500 COMPLEX PO) Take 1,500 mg by mouth daily.    . metoprolol succinate (TOPROL-XL) 50 MG 24 hr tablet TAKE 1 TABLET BY MOUTH DAILY. TAKE WITH OR IMMEDIATELY FOLLOWING A MEAL. 90 tablet 3  . Probiotic Product (PROBIOTIC PO) Take 1 tablet by mouth daily. PROBIOTICS 10    . ranitidine (ZANTAC) 150 MG capsule Take 300 mg by mouth every evening.    . vitamin E 400 UNIT capsule Take 400 Units by mouth 2 (two) times daily.      No current facility-administered medications for this visit.     Allergies  Allergen Reactions  . Latex Anaphylaxis  . Penicillins   . Sulfa Antibiotics Other (See Comments)    Hives...only when mixed sulfa and pencillin together    Review of Systems  Constitutional: Negative.   HENT: Positive for hearing loss.   Eyes: Negative.   Respiratory: Negative.   Cardiovascular: Negative for chest pain.  Gastrointestinal:       Reflux  Endocrine: Negative.   Genitourinary: Negative.   Musculoskeletal: Negative.   Skin: Negative.   Allergic/Immunologic: Negative.   Neurological: Negative.   Hematological: Negative.   Psychiatric/Behavioral: Negative.     BP 121/79   Pulse 66   Resp 20   Ht 6' (1.829 m)   Wt 208 lb (94.3 kg)   SpO2 92% Comment: RA  BMI 28.21 kg/m  Physical Exam  Constitutional: He appears well-developed and well-nourished. No distress.  HENT:  Head: Normocephalic and atraumatic.  Mouth/Throat: Oropharynx is clear and moist.  Eyes: EOM are normal. Pupils are equal, round, and reactive to light.  Neck: Normal range of motion. Neck supple. No JVD present. No thyromegaly  present.  Cardiovascular: Normal rate, regular rhythm and normal heart sounds.   No murmur heard. Pulmonary/Chest: Effort normal and breath sounds normal.     Diagnostic Tests:  CLINICAL DATA:  Followup of aneurysmal dilatation of the aortic root at the level of the sinuses of Valsalva.  EXAM: CT ANGIOGRAPHY CHEST WITH CONTRAST  TECHNIQUE: Multidetector CT imaging of the chest was performed using the standard protocol during bolus administration of intravenous contrast. Multiplanar CT image reconstructions and MIPs were obtained to evaluate the vascular anatomy.  CONTRAST:  100 mL Isovue 370 IV  COMPARISON:  06/17/2015 and gated cardiac CTA on 05/10/2014  FINDINGS: Cardiovascular: Stable aneurysmal dilatation of the aortic root at the level of the sinuses of Valsalva. Depending  on axis of measurement, the maximal diameter on axial imaging may be as large as 5.5 cm at the level of the origin of the right coronary artery. This is comparable to measurement obtained on axial images from the prior gated coronary CTA study in 2015. Just above this level, comparable measurements are made compared to the previous studies of 5.0-5.2 cm. The rest of the thoracic aorta shows normal caliber and no evidence of dissection. Proximal great vessels show normal patency and bovine branching anatomy.  Stable and normal heart size. Stable mild calcified plaque in the distribution of the LAD. No pericardial fluid.  Mediastinum/Nodes: No evidence of mediastinal, hilar or axillary lymphadenopathy.  Lungs/Pleura: There is no evidence of pulmonary edema, consolidation, pneumothorax, nodule or pleural fluid.  Upper Abdomen: Stable hepatic steatosis.  Musculoskeletal: Bony structures are unremarkable.  Review of the MIP images confirms the above findings.  IMPRESSION: Stable aneurysmal dilatation of the proximal thoracic aorta at the level of the sinuses of Valsalva. Maximal  diameter may be as large as 5.5 cm at the level of the origin of the right coronary artery. When making comparable measurements on the gated coronary CTA in 2015, identical diameter is obtained.   Electronically Signed   By: Aletta Edouard M.D.   On: 06/22/2016 14:51  Impression:  He has an aortic root aneurysm that is probably stable at 4.7-5 cm. The radiologist felt that it may be as large as 5.5 cm on the most recent CTA although it is difficult to precisely measure this area on CTA due to the angulation of the aortic root and surrounding structures. The gated cardiac CT in 05/2014 gave very precise orthogonal images and the sinus diameter was 4.7 cm. The images on the most recent chest CTA are not clear and it is difficult to precisely measure the sinus diameter. I think it is best to do a follow up gated cardiac CT in 6 months so that we can precisely measure the aortic root. He is scheduled to have an echo done later this month. I do not hear any murmur. In general I would recommend root replacement if the aneurysm reached 5.5 cm but I am still skeptical that it is there yet. His valve sounds normal and he could possibly have a valve-sparing root replacement when indicated. I reviewed the CT images with him and his wife and answered their questions.  Plan:  Follow up in 6 months with a gated cardiac CT to reassess the aortic root aneurysm.   I spent 45 minutes performing this consultation and > 50% of this time was spent face to face counseling and coordinating the care of this patient's aortic root aneurysm.  [name], MD [facility] [phone]

## 2016-10-13 ENCOUNTER — Encounter (HOSPITAL_COMMUNITY): Payer: Self-pay | Admitting: Emergency Medicine

## 2016-10-13 ENCOUNTER — Emergency Department (HOSPITAL_COMMUNITY)
Admission: EM | Admit: 2016-10-13 | Discharge: 2016-10-13 | Disposition: A | Discharge status: Home / Self Care | Payer: 59 | Attending: Emergency Medicine | Admitting: Emergency Medicine

## 2016-10-13 DIAGNOSIS — I10 Essential (primary) hypertension: Secondary | ICD-10-CM | POA: Diagnosis not present

## 2016-10-13 DIAGNOSIS — Z7982 Long term (current) use of aspirin: Secondary | ICD-10-CM | POA: Diagnosis not present

## 2016-10-13 DIAGNOSIS — K0889 Other specified disorders of teeth and supporting structures: Secondary | ICD-10-CM | POA: Diagnosis not present

## 2016-10-13 DIAGNOSIS — Z79899 Other long term (current) drug therapy: Secondary | ICD-10-CM | POA: Insufficient documentation

## 2016-10-13 DIAGNOSIS — K029 Dental caries, unspecified: Secondary | ICD-10-CM | POA: Diagnosis not present

## 2016-10-13 MED ORDER — PENICILLIN V POTASSIUM 250 MG PO TABS
500.0000 mg | ORAL_TABLET | Freq: Four times a day (QID) | ORAL | Status: DC
  Administered 2016-10-13: 500 mg via ORAL
  Filled 2016-10-13: qty 2

## 2016-10-13 MED ORDER — PENICILLIN V POTASSIUM 250 MG PO TABS
ORAL_TABLET | ORAL | Status: AC
  Filled 2016-10-13: qty 1

## 2016-10-13 MED ORDER — PENICILLIN V POTASSIUM 500 MG PO TABS: 500.0000 mg | ORAL_TABLET | Freq: Three times a day (TID) | ORAL | 0 refills | Status: DC

## 2016-10-13 MED FILL — PENICILLIN VK 500 MG TABLET: 500 | 10 days supply | Qty: 30 | Fill #0

## 2016-10-13 NOTE — Discharge Instructions (Signed)
Tylenol every 4 hrs if needed for pain.  Follow-up with your dentist.

## 2016-10-13 NOTE — ED Triage Notes (Signed)
Pt states he has a broken tooth on the lower right jaw.  Has an appointment at the end of the month with dentist.  Pt states "I know there's not much you can do, I was just hoping to get some antibiotics".

## 2016-10-13 NOTE — ED Provider Notes (Signed)
[facility] DEPT Provider Note   CSN: [id number] Arrival date & time: 10/13/16  0007     History   Chief Complaint Chief Complaint  Patient presents with  . Dental Pain    HPI Lawrence Liu is a 61 y.o. male.  HPI  Lawrence Liu is a 61 y.o. male who presents to the Emergency Department complaining of dental pain that began several hours prior to arrival.  He states that he has a right lower tooth that has been partially avulsed for some time, but noticed a dull pain to the tooth 3-4 hours prior to arrival.  Pain has improved somewhat with tylenol, worsens with chewing.  He denies facial swelling or pain, fever, neck pain or difficulty swallowing.  He states that he has an appt with his dentist later this month.  Past Medical History:  Diagnosis Date  . Alpha galactosidase deficiency    multiple tick bites  . Aortic root dilatation ([place]) 11/06/2013   CTA of chest November 2016: aorta at sinus of Valsalva/sinotubular junction/ascending aorta are 5.2 cm, 4.0 cm, 3.7 cm -> little change from prior study  . Deep vein thrombosis ([place]) 2004   Left deep vein thrombosis of left calf  . GERD (gastroesophageal reflux disease)   . History of blood transfusion    as a baby for jaundice  . Hypertension   . 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 No lesions greater than 30% noted.  . RUQ abdominal pain     Patient Active Problem List   Diagnosis Date Noted  . Essential hypertension 10/01/2016  . Aortic root dilatation ([place]) 11/06/2013  . 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 PVC's (premature ventricular contractions) 10/02/2013  . History of DVT (deep vein thrombosis) 10/02/2013  . GERD (gastroesophageal reflux disease)   . Arthritis   . Deep vein thrombosis [facility])     Past Surgical History:  Procedure Laterality Date  . LEFT HEART CATHETERIZATION WITH CORONARY ANGIOGRAM N/A 10/03/2013   Procedure: LEFT HEART CATHETERIZATION WITH  CORONARY ANGIOGRAM;  Surgeon: Jolaine Artist, MD;  Location: [facility] CATH LAB;  Service: Cardiovascular:  Nonobstructive disease - LAD diffuse 20-30%. Small distal vessel. Circumflex ostial 30-40%. Large OM 1 with 30% mid. Small OM 2 and several small PL branches. Large dominant RCA with 30% mid and diffuse 20-30% distal.  . SHOULDER ARTHROSCOPY  07/15/2011   Procedure: ARTHROSCOPY SHOULDER;  Surgeon: Metta Clines Supple;  Location: [place];  Service: Orthopedics;  Laterality: Right;  RIGHT ARTHROSCOPY SUBACROMIAL DECOMPRESSION AND DISTAL CLAVICLE RESECTION  . TONSILLECTOMY  ~ 1970  . TRANSTHORACIC ECHOCARDIOGRAM  March 2015   Upper limit size LV. Mild LVH. EF 60%. Normal wall motion. Ascending aorta measures 48 mm at root tapering to 40 mm at proximal ascending. Mild RV dilation.       Home Medications    Prior to Admission medications   Medication Sig Start Date End Date Taking? Authorizing Provider  amLODipine-benazepril (LOTREL) 5-20 MG capsule Take 1 capsule by mouth daily. 09/03/16   Leonie Man, MD  aspirin EC 81 MG tablet Take 81 mg by mouth daily.    Historical Provider, MD  atorvastatin (LIPITOR) 20 MG tablet Take 1 tablet (20 mg total) by mouth daily. 10/01/16   Leonie Man, MD  Cholecalciferol (VITAMIN D3) 2000 units TABS Take 1 tablet by mouth daily.    Historical Provider, MD  CIALIS 20 MG tablet TAKE 1 TABLET BY MOUTH DAILY AS NEEDED FOR  ERECTILE DYSFUNCTION. 09/26/14   [name], MD  co-enzyme Q-10 30 MG capsule Take 300 mg by mouth daily.     Historical Provider, MD  Cyanocobalamin (VITAMIN B-12) 5000 MCG TBDP Take 1 tablet by mouth daily.    Historical Provider, MD  fexofenadine (ALLEGRA) 180 MG tablet Take 180 mg by mouth daily.    Historical Provider, MD  Glucosamine-Chondroit-Vit C-Mn (GLUCOSAMINE 1500 COMPLEX PO) Take 1,500 mg by mouth daily.    Historical Provider, MD  metoprolol succinate (TOPROL-XL) 50 MG 24 hr tablet TAKE 1 TABLET BY MOUTH DAILY. TAKE WITH OR  IMMEDIATELY FOLLOWING A MEAL. 04/07/16   [name], MD  penicillin v potassium (VEETID) 500 MG tablet Take 1 tablet (500 mg total) by mouth 3 (three) times daily. 10/13/16   Bora Broner, PA-C  Probiotic Product (PROBIOTIC PO) Take 1 tablet by mouth daily. PROBIOTICS 10    Historical Provider, MD  ranitidine (ZANTAC) 150 MG capsule Take 300 mg by mouth every evening.    Historical Provider, MD  vitamin E 400 UNIT capsule Take 400 Units by mouth 2 (two) times daily.     Historical Provider, MD    Family History Family History  Problem Relation Age of Onset  . Heart disease Father   . Diabetes Father   . Diabetes Mother     Social History Social History  Substance Use Topics  . Smoking status: Never Smoker  . Smokeless tobacco: Never Used  . Alcohol use Yes     Comment: 10/02/2013 "hard apple cider maybe once/month"     Allergies   Latex; Penicillins; and Sulfa antibiotics   Review of Systems Review of Systems  Constitutional: Negative for appetite change and fever.  HENT: Positive for dental problem. Negative for congestion, facial swelling, sore throat and trouble swallowing.   Eyes: Negative for pain and visual disturbance.  Musculoskeletal: Negative for neck pain and neck stiffness.  Neurological: Negative for dizziness, facial asymmetry and headaches.  Hematological: Negative for adenopathy.  All other systems reviewed and are negative.    Physical Exam Updated Vital Signs BP 156/94 (BP Location: Right Arm)   Pulse 95   Temp 98.1 F (36.7 C) (Oral)   Resp 18   Ht 6' (1.829 m)   Wt 93.4 kg   SpO2 95%   BMI 27.94 kg/m   Physical Exam  Constitutional: He is oriented to person, place, and time. He appears well-developed and well-nourished. No distress.  HENT:  Head: Normocephalic and atraumatic.  Right Ear: Tympanic membrane and ear canal normal.  Left Ear: Tympanic membrane and ear canal normal.  Mouth/Throat: Uvula is midline, oropharynx is clear and  moist and mucous membranes are normal. No trismus in the jaw. Dental caries present. No dental abscesses or uvula swelling.  Tenderness, decay and partial avulsion of the right lower tooth, #27. Mild erythema of the surrounding gingiva without fluctuance. No facial swelling, obvious dental abscess, trismus, or sublingual abnml.    Neck: Normal range of motion. Neck supple.  Cardiovascular: Normal rate, regular rhythm and normal heart sounds.   No murmur heard. Pulmonary/Chest: Effort normal and breath sounds normal.  Musculoskeletal: Normal range of motion.  Lymphadenopathy:    He has no cervical adenopathy.  Neurological: He is alert and oriented to person, place, and time. He exhibits normal muscle tone. Coordination normal.  Skin: Skin is warm and dry.  Nursing note and vitals reviewed.    ED Treatments / Results  Labs (all labs ordered  are listed, but only abnormal results are displayed) Labs Reviewed - No data to display  EKG  EKG Interpretation None       Radiology No results found.  Procedures Procedures (including critical care time)  Medications Ordered in ED Medications  penicillin v potassium (VEETID) tablet 500 mg (not administered)     Initial Impression / Assessment and Plan / ED Course  I have reviewed the triage vital signs and the nursing notes.  Pertinent labs & imaging results that were available during my care of the patient were reviewed by me and considered in my medical decision making (see chart for details).     Pt is well appearing.  Vitals stable.  Dental caries without abscess, no concerning sx's for Ludwig's angina.  Pt has appt with a dentist for end of this month.   Rx for Pen VK  Final Clinical Impressions(s) / ED Diagnoses   Final diagnoses:  Pain, dental    New Prescriptions New Prescriptions   PENICILLIN V POTASSIUM (VEETID) 500 MG TABLET    Take 1 tablet (500 mg total) by mouth 3 (three) times daily.     Kem Parkinson,  PA-C 10/13/16 0104    Rolland Porter, MD 10/13/16 [phone]

## 2016-10-13 NOTE — ED Notes (Signed)
Pt states understanding of care given and follow up instructions.  Pt ambulated from ED  

## 2016-10-18 ENCOUNTER — Other Ambulatory Visit: Payer: Self-pay

## 2016-10-18 ENCOUNTER — Ambulatory Visit (HOSPITAL_COMMUNITY): Payer: 59 | Attending: Cardiovascular Disease

## 2016-10-18 DIAGNOSIS — E784 Other hyperlipidemia: Secondary | ICD-10-CM

## 2016-10-18 DIAGNOSIS — I10 Essential (primary) hypertension: Secondary | ICD-10-CM

## 2016-10-18 DIAGNOSIS — I501 Left ventricular failure: Secondary | ICD-10-CM | POA: Insufficient documentation

## 2016-10-18 DIAGNOSIS — Z79899 Other long term (current) drug therapy: Secondary | ICD-10-CM | POA: Diagnosis not present

## 2016-10-18 DIAGNOSIS — E785 Hyperlipidemia, unspecified: Secondary | ICD-10-CM

## 2016-10-18 DIAGNOSIS — I77810 Thoracic aortic ectasia: Secondary | ICD-10-CM | POA: Diagnosis not present

## 2016-10-19 LAB — LIPID PANEL
CHOLESTEROL TOTAL: 105 mg/dL (ref 100–199)
Chol/HDL Ratio: 3.1 ratio units (ref 0.0–5.0)
HDL: 34 mg/dL — ABNORMAL LOW (ref >39)
LDL Calculated: 47 mg/dL (ref 0–99)
Triglycerides: 122 mg/dL (ref 0–149)
VLDL Cholesterol Cal: 24 mg/dL (ref 5–40)

## 2016-10-19 LAB — COMPREHENSIVE METABOLIC PANEL
ALBUMIN: 4.1 g/dL (ref 3.5–5.5)
ALK PHOS: 54 IU/L (ref 39–117)
ALT: 37 IU/L (ref 0–44)
AST: 23 IU/L (ref 0–40)
Albumin/Globulin Ratio: 1.6 (ref 1.2–2.2)
BILIRUBIN TOTAL: 0.2 mg/dL (ref 0.0–1.2)
BUN/Creatinine Ratio: 26 — ABNORMAL HIGH (ref 9–20)
BUN: 22 mg/dL (ref 6–24)
CHLORIDE: 103 mmol/L (ref 96–106)
CO2: 24 mmol/L (ref 18–29)
Calcium: 9.2 mg/dL (ref 8.7–10.2)
Creatinine, Ser: 0.85 mg/dL (ref 0.76–1.27)
GFR calc Af Amer: 111 mL/min/{1.73_m2} (ref >59)
GFR calc non Af Amer: 96 mL/min/{1.73_m2} (ref >59)
GLUCOSE: 92 mg/dL (ref 65–99)
Globulin, Total: 2.6 g/dL (ref 1.5–4.5)
Potassium: 4.7 mmol/L (ref 3.5–5.2)
Sodium: 141 mmol/L (ref 134–144)
Total Protein: 6.7 g/dL (ref 6.0–8.5)

## 2016-10-26 ENCOUNTER — Other Ambulatory Visit: Payer: Self-pay | Admitting: Gastroenterology

## 2016-10-26 DIAGNOSIS — Z1211 Encounter for screening for malignant neoplasm of colon: Secondary | ICD-10-CM | POA: Diagnosis not present

## 2016-10-26 DIAGNOSIS — K219 Gastro-esophageal reflux disease without esophagitis: Secondary | ICD-10-CM | POA: Diagnosis not present

## 2016-11-17 MED FILL — GAVILYTE-G SOLUTION: 236 | 1 days supply | Qty: 4000 | Fill #0

## 2016-11-19 NOTE — Progress Notes (Signed)
Reviewed patient medical history with dr Marcie Bal, patient is ok to have colonscopy done at [facility] long per dr hodierne anesthesia.

## 2016-11-22 ENCOUNTER — Encounter (HOSPITAL_COMMUNITY): Payer: Self-pay | Admitting: *Deleted

## 2016-11-22 NOTE — Anesthesia Preprocedure Evaluation (Addendum)
Anesthesia Evaluation  Patient identified by MRN, date of birth, ID band Patient awake    Reviewed: Allergy & Precautions, NPO status , Patient's Chart, lab work & pertinent test results  History of Anesthesia Complications (+) DIFFICULT AIRWAY  Airway Mallampati: III  TM Distance: <3 FB Neck ROM: Full    Dental no notable dental hx.    Pulmonary neg pulmonary ROS,    Pulmonary exam normal breath sounds clear to auscultation       Cardiovascular hypertension, Pt. on home beta blockers and Pt. on medications + Peripheral Vascular Disease  Normal cardiovascular exam Rhythm:Regular Rate:Normal     Neuro/Psych negative neurological ROS  negative psych ROS   GI/Hepatic Neg liver ROS, GERD  ,  Endo/Other  negative endocrine ROS  Renal/GU negative Renal ROS  negative genitourinary   Musculoskeletal negative musculoskeletal ROS (+)   Abdominal   Peds negative pediatric ROS (+)  Hematology negative hematology ROS (+)   Anesthesia Other Findings   Reproductive/Obstetrics negative OB ROS                             Anesthesia Physical Anesthesia Plan  ASA: III  Anesthesia Plan: MAC   Post-op Pain Management:    Induction: Intravenous  Airway Management Planned: Nasal Cannula  Additional Equipment:   Intra-op Plan:   Post-operative Plan:   Informed Consent: I have reviewed the patients History and Physical, chart, labs and discussed the procedure including the risks, benefits and alternatives for the proposed anesthesia with the patient or authorized representative who has indicated his/her understanding and acceptance.   Dental advisory given  Plan Discussed with: CRNA and Surgeon  Anesthesia Plan Comments:         Anesthesia Quick Evaluation

## 2016-11-23 ENCOUNTER — Ambulatory Visit (HOSPITAL_COMMUNITY): Payer: 59 | Admitting: Anesthesiology

## 2016-11-23 ENCOUNTER — Encounter (HOSPITAL_COMMUNITY)
Admission: RE | Disposition: A | Discharge status: Home / Self Care | Payer: Self-pay | Source: Ambulatory Visit | Attending: Gastroenterology

## 2016-11-23 ENCOUNTER — Ambulatory Visit (HOSPITAL_COMMUNITY)
Admission: RE | Admit: 2016-11-23 | Discharge: 2016-11-23 | Disposition: A | Discharge status: Home / Self Care | Payer: 59 | Source: Ambulatory Visit | Attending: Gastroenterology | Admitting: Gastroenterology

## 2016-11-23 ENCOUNTER — Encounter (HOSPITAL_COMMUNITY): Payer: Self-pay

## 2016-11-23 DIAGNOSIS — Z86718 Personal history of other venous thrombosis and embolism: Secondary | ICD-10-CM | POA: Diagnosis not present

## 2016-11-23 DIAGNOSIS — Z79899 Other long term (current) drug therapy: Secondary | ICD-10-CM | POA: Diagnosis not present

## 2016-11-23 DIAGNOSIS — K219 Gastro-esophageal reflux disease without esophagitis: Secondary | ICD-10-CM | POA: Diagnosis not present

## 2016-11-23 DIAGNOSIS — Z1211 Encounter for screening for malignant neoplasm of colon: Secondary | ICD-10-CM | POA: Insufficient documentation

## 2016-11-23 DIAGNOSIS — I2510 Atherosclerotic heart disease of native coronary artery without angina pectoris: Secondary | ICD-10-CM | POA: Diagnosis not present

## 2016-11-23 DIAGNOSIS — I82409 Acute embolism and thrombosis of unspecified deep veins of unspecified lower extremity: Secondary | ICD-10-CM | POA: Diagnosis not present

## 2016-11-23 DIAGNOSIS — I739 Peripheral vascular disease, unspecified: Secondary | ICD-10-CM | POA: Diagnosis not present

## 2016-11-23 DIAGNOSIS — I10 Essential (primary) hypertension: Secondary | ICD-10-CM | POA: Insufficient documentation

## 2016-11-23 DIAGNOSIS — Z7982 Long term (current) use of aspirin: Secondary | ICD-10-CM | POA: Insufficient documentation

## 2016-11-23 HISTORY — DX: Pneumonia, unspecified organism: J18.9

## 2016-11-23 HISTORY — PX: COLONOSCOPY WITH PROPOFOL: SHX5780

## 2016-11-23 SURGERY — COLONOSCOPY WITH PROPOFOL
Anesthesia: Monitor Anesthesia Care

## 2016-11-23 MED ORDER — PROPOFOL 10 MG/ML IV BOLUS
INTRAVENOUS | Status: AC
  Filled 2016-11-23: qty 40

## 2016-11-23 MED ORDER — PROPOFOL 500 MG/50ML IV EMUL
INTRAVENOUS | Status: DC | PRN
  Administered 2016-11-23: 100 ug/kg/min via INTRAVENOUS

## 2016-11-23 MED ORDER — SODIUM CHLORIDE 0.9 % IV SOLN: INTRAVENOUS | Status: DC

## 2016-11-23 MED ORDER — PROPOFOL 500 MG/50ML IV EMUL
INTRAVENOUS | Status: DC | PRN
  Administered 2016-11-23: 20 mg via INTRAVENOUS
  Administered 2016-11-23: 10 mg via INTRAVENOUS
  Administered 2016-11-23: 50 mg via INTRAVENOUS

## 2016-11-23 MED ORDER — LACTATED RINGERS IV SOLN
INTRAVENOUS | Status: DC
  Administered 2016-11-23: 07:00:00 via INTRAVENOUS

## 2016-11-23 SURGICAL SUPPLY — 21 items

## 2016-11-23 NOTE — H&P (Signed)
Lawrence Liu is an 61 y.o. male.   Chief Complaint: Colorectal cancer screening. HPI: 61 year old white male with multiple medical issues including a difficult intubation here for a screening colonoscopy. See office notes for details.  Past Medical History:  Diagnosis Date  . Alpha galactosidase deficiency    multiple tick bites  . Aortic root dilatation ([place]) 11/06/2013   CTA of chest November 2016: aorta at sinus of Valsalva/sinotubular junction/ascending aorta are 5.2 cm, 4.0 cm, 3.7 cm -> little change from prior study  . Deep vein thrombosis ([place]) 2004   Left deep vein thrombosis of left calf  . GERD (gastroesophageal reflux disease)   . History of blood transfusion    as a baby for jaundice  . Hypertension   . 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 No lesions greater than 30% noted.  . Pneumonia 1980's   walking pneumonia   Past Surgical History:  Procedure Laterality Date  . colonscopy  5 1/2 yrs ago   polyps removed  . LEFT HEART CATHETERIZATION WITH CORONARY ANGIOGRAM N/A 10/03/2013   Procedure: LEFT HEART CATHETERIZATION WITH CORONARY ANGIOGRAM;  Surgeon: Jolaine Artist, MD;  Location: [facility] CATH LAB;  Service: Cardiovascular:  Nonobstructive disease - LAD diffuse 20-30%. Small distal vessel. Circumflex ostial 30-40%. Large OM 1 with 30% mid. Small OM 2 and several small PL branches. Large dominant RCA with 30% mid and diffuse 20-30% distal.  . SHOULDER ARTHROSCOPY  07/15/2011   Procedure: ARTHROSCOPY SHOULDER;  Surgeon: Metta Clines Supple;  Location: [place];  Service: Orthopedics;  Laterality: Right;  RIGHT ARTHROSCOPY SUBACROMIAL DECOMPRESSION AND DISTAL CLAVICLE RESECTION  . TONSILLECTOMY  ~ 1970   adenoids also  . TRANSTHORACIC ECHOCARDIOGRAM  March 2015   Upper limit size LV. Mild LVH. EF 60%. Normal wall motion. Ascending aorta measures 48 mm at root tapering to 40 mm at proximal ascending. Mild RV dilation.   Family History   Problem Relation Age of Onset  . Heart disease Father   . Diabetes Father   . Diabetes Mother    Social History:  reports that he has never smoked. He has never used smokeless tobacco. He reports that he drinks alcohol. He reports that he does not use drugs.  Allergies:  Allergies  Allergen Reactions  . Latex Anaphylaxis  . Penicillins     Causes hives when combined with sulfa, can take penicillin by itself  . Sulfa Antibiotics Other (See Comments)    Hives...only when mixed sulfa and pencillin together   Medications Prior to Admission  Medication Sig Dispense Refill  . amLODipine-benazepril (LOTREL) 5-20 MG capsule Take 1 capsule by mouth daily. 90 capsule 0  . aspirin EC 81 MG tablet Take 81 mg by mouth daily.    Marland Kitchen atorvastatin (LIPITOR) 20 MG tablet Take 1 tablet (20 mg total) by mouth daily. 90 tablet 3  . Cholecalciferol (VITAMIN D3) 5000 units CAPS Take 5,000 Units by mouth daily.    Marland Kitchen CIALIS 20 MG tablet TAKE 1 TABLET BY MOUTH DAILY AS NEEDED FOR ERECTILE DYSFUNCTION. 6 tablet PRN  . Coenzyme Q10 (CO Q-10) 300 MG CAPS Take 300 mg by mouth daily.    . Cyanocobalamin (VITAMIN B-12) 5000 MCG TBDP Take 5,000 mcg by mouth daily.     . fexofenadine (ALLEGRA) 180 MG tablet Take 180 mg by mouth daily.    . Glucosamine-Chondroit-Vit C-Mn (GLUCOSAMINE 1500 COMPLEX PO) Take 1,500 mg by mouth daily.    Marland Kitchen  metoprolol succinate (TOPROL-XL) 50 MG 24 hr tablet TAKE 1 TABLET BY MOUTH DAILY. TAKE WITH OR IMMEDIATELY FOLLOWING A MEAL. 90 tablet 3  . Probiotic Product (PROBIOTIC PO) Take 1 tablet by mouth daily. PROBIOTICS 10    . ranitidine (ZANTAC) 150 MG capsule Take 300 mg by mouth daily.     . vitamin C (ASCORBIC ACID) 500 MG tablet Take 1,000 mg by mouth daily.    . vitamin E 400 UNIT capsule Take 800 Units by mouth daily.     . penicillin v potassium (VEETID) 500 MG tablet Take 1 tablet (500 mg total) by mouth 3 (three) times daily. (Patient not taking: Reported on 11/22/2016) 30 tablet 0     No results found for this or any previous visit (from the past 48 hour(s)). No results found.  Review of Systems  Constitutional: Negative.   HENT: Negative.   Eyes: Negative.   Respiratory: Negative.   Cardiovascular: Negative.   Gastrointestinal: Positive for heartburn.  Musculoskeletal: Positive for joint pain.  Skin: Negative.   Neurological: Negative.   Endo/Heme/Allergies: Negative.   Psychiatric/Behavioral: Negative.    Blood pressure (!) 158/100, pulse 77, temperature 98.2 F (36.8 C), temperature source Oral, resp. rate 16, height 6' (1.829 m), weight 90.7 kg (200 lb), SpO2 97 %. Physical Exam  Constitutional: He is oriented to person, place, and time. He appears well-developed and well-nourished.  HENT:  Head: Normocephalic and atraumatic.  Eyes: Conjunctivae and EOM are normal. Pupils are equal, round, and reactive to light.  Neck: Normal range of motion. Neck supple.  Cardiovascular: Normal rate and regular rhythm.   Respiratory: Effort normal and breath sounds normal.  GI: Soft. Bowel sounds are normal.  Musculoskeletal: Normal range of motion.  Neurological: He is alert and oriented to person, place, and time.  Skin: Skin is warm and dry.  Psychiatric: He has a normal mood and affect. His behavior is normal. Judgment and thought content normal.    Assessment/Plan Colorectal cancer screening: proceed with a colonoscopy at this time.  Kelita Wallis, MD 11/23/2016, 7:13 AM

## 2016-11-23 NOTE — Transfer of Care (Signed)
Immediate Anesthesia Transfer of Care Note  Patient: Lawrence Liu  Procedure(s) Performed: Procedure(s): COLONOSCOPY WITH PROPOFOL (N/A)  Patient Location: PACU  Anesthesia Type:MAC  Level of Consciousness: awake, alert  and oriented  Airway & Oxygen Therapy: Patient Spontanous Breathing and Patient connected to face mask oxygen  Post-op Assessment: Report given to RN and Post -op Vital signs reviewed and stable  Post vital signs: Reviewed and stable  Last Vitals:  Vitals:   11/23/16 0643  BP: (!) 158/100  Pulse: 77  Resp: 16  Temp: 36.8 C    Last Pain:  Vitals:   11/23/16 0643  TempSrc: Oral         Complications: No apparent anesthesia complications

## 2016-11-23 NOTE — Discharge Instructions (Signed)

## 2016-11-23 NOTE — Anesthesia Postprocedure Evaluation (Signed)
Anesthesia Post Note  Patient: Lawrence Liu  Procedure(s) Performed: Procedure(s) (LRB): COLONOSCOPY WITH PROPOFOL (N/A)  Patient location during evaluation: PACU Anesthesia Type: MAC Level of consciousness: awake and alert Pain management: pain level controlled Vital Signs Assessment: post-procedure vital signs reviewed and stable Respiratory status: spontaneous breathing, nonlabored ventilation, respiratory function stable and patient connected to nasal cannula oxygen Cardiovascular status: stable and blood pressure returned to baseline Anesthetic complications: no       Last Vitals:  Vitals:   11/23/16 0800 11/23/16 0810  BP:  (!) 138/99  Pulse:  78  Resp:  13  Temp: 36.4 C     Last Pain:  Vitals:   11/23/16 0800  TempSrc: Oral                 [name] S

## 2016-11-23 NOTE — Op Note (Signed)
[facility] Patient Name: Lawrence Liu Procedure Date: 11/23/2016 MRN: [id number] Attending MD: Juanita Craver , MD Date of Birth: March 24, 1956 CSN: [id number] Age: 61 Admit Type: Outpatient Procedure:                Screening colonoscopy. Indications:              CRC screening for colorectal malignant neoplasm. Providers:                Juanita Craver, MD, Cleda Daub, RN, William Dalton, technician, Rosario Adie, CRNA Referring MD:             Cammie Mcgee. Dennard Schaumann, MD Medicines:                Monitored Anesthesia Care Complications:            No immediate complications. Estimated Blood Loss:     Estimated blood loss: none. Procedure:                Pre-anesthesia assessment: - Prior to the                            procedure, a history and physical was performed,                            and patient medications and allergies were                            reviewed. The patient's tolerance of previous                            anesthesia was also reviewed. The risks and                            benefits of the procedure and the sedation options                            and risks were discussed with the patient. All                            questions were answered, and informed consent was                            obtained. Prior anticoagulants: The patient has                            taken aspirin, last dose was 7 days prior to                            procedure. ASA Grade Assessment: III - A patient                            with severe systemic disease. After reviewing the  risks and benefits, the patient was deemed in                            satisfactory condition to undergo the procedure.                            After obtaining informed consent, the colonoscope                            was passed under direct vision. Throughout the                            procedure, the patient's  blood pressure, pulse, and                            oxygen saturations were monitored continuously. The                            EC-[id number] ([id number]) scope was introduced through                            the anus and advanced to the the terminal ileum,                            with identification of the appendiceal orifice and                            IC valve. The colonoscopy was performed without                            difficulty. The patient tolerated the procedure                            well. The quality of the bowel preparation was                            adequate. The terminal ileum, the ileocecal valve,                            the appendiceal orifice and the rectum were                            photographed. The bowel preparation used was                            GoLYTELY. Scope In: 7:32:09 AM Scope Out: 7:45:48 AM Scope Withdrawal Time: 0 hours 7 minutes 26 seconds  Total Procedure Duration: 0 hours 13 minutes 39 seconds  Findings:      The entire examined portion of the colon appeared normal.      The terminal ileum appeared normal.      No additional abnormalities were found on retroflexion. Impression:               - The entire examined colon is normal.                           -  The examined portion of the ileum was normal.                           - No specimens collected. Moderate Sedation:      MAC used. Recommendation:           - High fiber diet with augmented water consumption                            daily.                           - Continue present medications.                           - Repeat colonoscopy in 5 years for surveillance.                           - Return to GI office PRN.                           - If the patient has any abnormal GI symptoms in                            the interim, he has been advised to call the office                            ASAP for further recommendations. Procedure Code(s):        ---  Professional ---                           [id number], Colonoscopy, flexible; diagnostic, including                            collection of specimen(s) by brushing or washing,                            when performed (separate procedure) Diagnosis Code(s):        --- Professional ---                           Z12.11, Encounter for screening for malignant                            neoplasm of colon CPT copyright 2016 [employer]. All rights reserved. The codes documented in this report are preliminary and upon coder review may  be revised to meet current compliance requirements. Juanita Craver, MD Juanita Craver, MD 11/23/2016 7:55:18 AM This report has been signed electronically. Number of Addenda: 0

## 2016-11-25 ENCOUNTER — Encounter (HOSPITAL_COMMUNITY): Payer: Self-pay | Admitting: Gastroenterology

## 2016-12-03 MED FILL — METOPROLOL SUCC ER 50 MG TA: 50 | 90 days supply | Qty: 90 | Fill #2

## 2016-12-16 ENCOUNTER — Other Ambulatory Visit: Payer: Self-pay

## 2016-12-16 MED ORDER — AMLODIPINE BESY-BENAZEPRIL HCL 5-20 MG PO CAPS
1.0000 | ORAL_CAPSULE | Freq: Every day | ORAL | 0 refills | Status: DC
Start: 2016-12-16 — End: 2017-04-14

## 2016-12-16 MED FILL — AMLODIPINE-BENAZEPRIL 5-20: 5-20 | 90 days supply | Qty: 90 | Fill #0

## 2017-01-03 NOTE — Anesthesia Postprocedure Evaluation (Signed)
Anesthesia Post Note  Patient: Lawrence Liu  Procedure(s) Performed: Procedure(s) (LRB): COLONOSCOPY WITH PROPOFOL (N/A)     Anesthesia Post Evaluation  Last Vitals:  Vitals:   11/23/16 0800 11/23/16 0810  BP:  (!) 138/99  Pulse:  78  Resp:  13  Temp: 36.4 C     Last Pain:  Vitals:   11/24/16 1326  TempSrc:   PainSc: 0-No pain                 [name] S

## 2017-01-03 NOTE — Addendum Note (Signed)
Addendum  created 01/03/17 1345 by Myrtie Soman, MD   Sign clinical note

## 2017-01-18 MED FILL — ATORVASTATIN 20 MG TABLET: 20 | 90 days supply | Qty: 90 | Fill #1

## 2017-03-14 ENCOUNTER — Other Ambulatory Visit: Payer: Self-pay | Admitting: *Deleted

## 2017-03-17 MED FILL — METOPROLOL SUCC ER 50 MG TA: 50 | 90 days supply | Qty: 90 | Fill #3

## 2017-03-28 ENCOUNTER — Other Ambulatory Visit: Payer: Self-pay | Admitting: *Deleted

## 2017-03-28 DIAGNOSIS — I7121 Aneurysm of the ascending aorta, without rupture: Secondary | ICD-10-CM

## 2017-03-28 DIAGNOSIS — I719 Aortic aneurysm of unspecified site, without rupture: Secondary | ICD-10-CM

## 2017-04-07 ENCOUNTER — Other Ambulatory Visit: Payer: Self-pay | Admitting: *Deleted

## 2017-04-14 ENCOUNTER — Other Ambulatory Visit: Payer: Self-pay | Admitting: Cardiology

## 2017-04-14 MED FILL — AMLODIPINE-BENAZEPRIL 5-20: 5-20 | 90 days supply | Qty: 90 | Fill #0

## 2017-04-27 ENCOUNTER — Ambulatory Visit (HOSPITAL_COMMUNITY)
Admission: RE | Admit: 2017-04-27 | Discharge: 2017-04-27 | Disposition: A | Discharge status: Home / Self Care | Payer: 59 | Source: Ambulatory Visit | Attending: Surgery | Admitting: Surgery

## 2017-04-27 ENCOUNTER — Encounter: Payer: Self-pay | Admitting: Surgery

## 2017-04-27 ENCOUNTER — Ambulatory Visit (INDEPENDENT_AMBULATORY_CARE_PROVIDER_SITE_OTHER): Payer: 59 | Admitting: Surgery

## 2017-04-27 VITALS — BP 112/73 | HR 70 | Resp 16 | Ht 72.0 in | Wt 205.0 lb

## 2017-04-27 DIAGNOSIS — D739 Disease of spleen, unspecified: Secondary | ICD-10-CM | POA: Insufficient documentation

## 2017-04-27 DIAGNOSIS — I7121 Aneurysm of the ascending aorta, without rupture: Secondary | ICD-10-CM

## 2017-04-27 DIAGNOSIS — I719 Aortic aneurysm of unspecified site, without rupture: Secondary | ICD-10-CM

## 2017-04-27 DIAGNOSIS — I2510 Atherosclerotic heart disease of native coronary artery without angina pectoris: Secondary | ICD-10-CM | POA: Diagnosis not present

## 2017-04-27 DIAGNOSIS — I77810 Thoracic aortic ectasia: Secondary | ICD-10-CM | POA: Diagnosis not present

## 2017-04-27 MED ORDER — IOPAMIDOL (ISOVUE-370) INJECTION 76%
INTRAVENOUS | Status: AC
  Administered 2017-04-27: 100 mL
  Filled 2017-04-27: qty 100

## 2017-04-27 MED ORDER — NITROGLYCERIN 0.4 MG SL SUBL
SUBLINGUAL_TABLET | SUBLINGUAL | Status: AC
  Filled 2017-04-27: qty 2

## 2017-04-27 MED ORDER — NITROGLYCERIN 0.4 MG SL SUBL
0.8000 mg | SUBLINGUAL_TABLET | Freq: Once | SUBLINGUAL | Status: AC
  Administered 2017-04-27: 0.8 mg via SUBLINGUAL

## 2017-04-27 NOTE — Progress Notes (Signed)
CT scan completed. Tolerated well. D/C home walking with wife. Awake and alert. In no distress.

## 2017-04-28 DIAGNOSIS — I2510 Atherosclerotic heart disease of native coronary artery without angina pectoris: Secondary | ICD-10-CM | POA: Diagnosis not present

## 2017-05-01 ENCOUNTER — Encounter: Payer: Self-pay | Admitting: Surgery

## 2017-05-01 NOTE — Progress Notes (Signed)
HPI:  The patient returns today for follow up of an aortic root aneurysm. I initially saw him on 10/06/2016 and CTA of the chest on 06/22/2016 showed stable aneurysmal dilation of the aortic root but the radiologist thought that the diameter at the sinus level may be as large as 5.5 cm. We decided to do a gated cardiac CTA this time to get a more accurate measurement at the root level. This shows the diameter at the sinus level to be 49 x 47 x 43 mm with a maximum diameter across the sinuses of 55 mm and was felt to be unchanged from his previous study of 06/22/2016. His last echo on 10/18/2016 showed a trileaflet aortic valve with no stenosis or regurgitation. The aortic root was measured at 50 mm. He has been feeling well with no chest or back pain.  Current Outpatient Prescriptions  Medication Sig Dispense Refill  . amLODipine-benazepril (LOTREL) 5-20 MG capsule TAKE 1 CAPSULE BY MOUTH DAILY. 90 capsule 0  . aspirin EC 81 MG tablet Take 81 mg by mouth daily.    Marland Kitchen atorvastatin (LIPITOR) 20 MG tablet Take 1 tablet (20 mg total) by mouth daily. 90 tablet 3  . Cholecalciferol (VITAMIN D3) 5000 units CAPS Take 5,000 Units by mouth daily.    . Coenzyme Q10 (CO Q-10) 300 MG CAPS Take 300 mg by mouth daily.    . Cyanocobalamin (VITAMIN B-12) 5000 MCG TBDP Take 5,000 mcg by mouth daily.     . fexofenadine (ALLEGRA) 180 MG tablet Take 180 mg by mouth daily.    . Glucosamine-Chondroit-Vit C-Mn (GLUCOSAMINE 1500 COMPLEX PO) Take 1,500 mg by mouth daily.    . metoprolol succinate (TOPROL-XL) 50 MG 24 hr tablet TAKE 1 TABLET BY MOUTH DAILY. TAKE WITH OR IMMEDIATELY FOLLOWING A MEAL. 90 tablet 3  . Probiotic Product (PROBIOTIC PO) Take 1 tablet by mouth daily. PROBIOTICS 10    . ranitidine (ZANTAC) 150 MG capsule Take 300 mg by mouth daily.     . vitamin C (ASCORBIC ACID) 500 MG tablet Take 1,000 mg by mouth daily.    . vitamin E 400 UNIT capsule Take 800 Units by mouth daily.     Marland Kitchen CIALIS 20 MG tablet  TAKE 1 TABLET BY MOUTH DAILY AS NEEDED FOR ERECTILE DYSFUNCTION. (Patient not taking: Reported on 04/27/2017) 6 tablet PRN   No current facility-administered medications for this visit.      Physical Exam: BP 112/73 (BP Location: Right Arm, Patient Position: Sitting, Cuff Size: Large)   Pulse 70   Resp 16   Ht 6' (1.829 m)   Wt 205 lb (93 kg)   SpO2 92% Comment: ON RA  BMI 27.80 kg/m  He looks well Cardiac exam shows a regular rate and rhythm with normal heart sounds. There is no murmur. Lungs are clear  Diagnostic Tests:  CT CORONARY MORPH W/CTA COR W/SCORE W/CA W/CM &/OR WO/CM (Accession [id number]) (Order [id number])  Imaging  Date: 04/27/2017 Department: [facility] CT IMAGING Released By: Ermalinda Barrios Authorizing: [name], MD  Exam Information   Status Exam Begun  Exam Ended   Final [99] 04/27/2017 9:04 AM 04/27/2017 9:22 AM  PACS Images   Show images for CT CORONARY MORPH W/CTA COR W/SCORE W/CA W/CM &/OR WO/CM  Addendum   ADDENDUM REPORT: 04/27/2017 15:20  CLINICAL DATA:  61 year old male with known aortic root dilatation, 10 months follow up study.  EXAM: Cardiac/Coronary CT/ Chest CTA  COMPARISON:  Chest CTA from 06/22/2016  TECHNIQUE: The patient was scanned on a [employer].  A 120 kV prospective scan was triggered in the descending thoracic aorta at 111 HU's. Axial non-contrast 3 mm slices were carried out through the heart. The data set was analyzed on a dedicated work station and scored using the [place]. Gantry rotation speed was 250 msecs and collimation was .6 mm. No beta blockade and 0.8 mg of sl NTG was given. The 3D data set was reconstructed in 5% intervals of the 67-82 % of the R-R cycle. Diastolic phases were analyzed on a dedicated work station using MPR, MIP and VRT modes. The patient received 80 cc of contrast.  FINDINGS: Aortic Valve:  Trileaflet with no calcifications and no  thickening.  Aorta: Dilated aortic root including sinotubular junction, normal size of the ascending aorta, aortic arch and descending aorta. No calcifications and no dissection.  Sinotubular Junction:  42 x 39 mm  Ascending Thoracic Aorta:  37 x 35 mm  Aortic Arch:  29 x 26 mm  Descending Thoracic Aorta:  27 x 27 mm  Sinus of Valsalva Measurements:  Non-coronary:  43 mm  Right -coronary:  47 mm  Left -coronary:  49 mm  Coronary Arteries:  Normal coronary origin.  Right dominance.  RCA is a large dominant artery that gives rise to PDA and PLVB. There is minimal diffuse calcified plaque with associated stenosis 25-50%.  Left main is a large artery that gives rise to LAD and LCX arteries.  LAD is a large vessel that has gives rise to one small diagonal artery. There is mild diffuse calcified plaque in the proximal to mid LAD with maximum stenosis 25-50%.  LCX is a non-dominant artery that gives rise to one large OM1 branch. There is mild diffuse calcified plaque in the OM branch with maximum stenosis 25-50%.  Other findings:  Normal pulmonary vein drainage into the left atrium.  Normal let atrial appendage without a thrombus.  Normal size of the pulmonary artery.  IMPRESSION: 1. Dilated aortic root including sinotubular junction. Sinuses size 49 x 47 x 43 mm. The maximum diameter across sinuses measures 55 mm and is unchanged from the prior study on 06/22/2016.  2. Normal size of the ascending aorta, aortic arch and descending aorta. No calcifications and no dissection.  3. Coronary calcium score of 153. This was 25 percentile for age and sex matched control.  4. Normal coronary origin with right dominance. Mild non-obstructive plaque in the RCA, LAD and OM1. Aggressive risk factor modification is recommended.  5. Mildly dilated pulmonary artery measuring 34 mm suggestive of pulmonary hypertension.  6.  No ASD/VSD.  7.  No  thrombus in the left atrial appendage.  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 The maximum orthogonal measurement across the sinuses is 49 mm across the left coronary sinus but the maximum diameter is 55 mm if measured across the widest part of the asymmetric sinuses. Echo has shown no AI. 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 I have reviewed all of his prior studies and I think this aneurysm is stable but it is at the diameter that surgical repair is recommended bases on the current study. This aneurysm is of the root phenotype which probably increases his risk of aortic  dissection. His BP is well-controlled. I reviewed the images with him but did not have the edited images of the root when I saw him in the office. I told him that I would call him after they were available and officially interpreted to discuss the results with him and make further plans.   Plan:  I will call him to discuss the final results of the gated cardiac CT and decide on the next course of action.  I spent 15 minutes performing this established patient evaluation and > 50% of this time was spent face to face counseling and coordinating the care of this patient's aortic aneurysm.    [name], MD [facility] [phone]

## 2017-05-06 MED FILL — ATORVASTATIN 20 MG TABLET: 20 | 90 days supply | Qty: 90 | Fill #2

## 2017-05-11 ENCOUNTER — Ambulatory Visit (INDEPENDENT_AMBULATORY_CARE_PROVIDER_SITE_OTHER): Payer: 59 | Admitting: Surgery

## 2017-05-11 ENCOUNTER — Encounter: Payer: Self-pay | Admitting: Surgery

## 2017-05-11 VITALS — BP 119/77 | HR 67 | Resp 16 | Ht 72.0 in | Wt 205.0 lb

## 2017-05-11 DIAGNOSIS — I77810 Thoracic aortic ectasia: Secondary | ICD-10-CM

## 2017-05-13 ENCOUNTER — Encounter: Payer: Self-pay | Admitting: Surgery

## 2017-05-13 NOTE — Progress Notes (Signed)
HPI:  Lawrence Liu returned today to review the results of his recent gated cardiac CT scan. This shows the diameter at the sinus level to be 49 x 47 x 43 mm with a maximum diameter across the sinuses of 55 mm and was felt to be unchanged from his previous study of 06/22/2016. His last echo on 10/18/2016 showed a trileaflet aortic valve with no stenosis or regurgitation. The aortic root was measured at 50 mm. He has been feeling well with no chest or back pain.  Current Outpatient Prescriptions  Medication Sig Dispense Refill  . amLODipine-benazepril (LOTREL) 5-20 MG capsule TAKE 1 CAPSULE BY MOUTH DAILY. 90 capsule 0  . aspirin EC 81 MG tablet Take 81 mg by mouth daily.    Marland Kitchen atorvastatin (LIPITOR) 20 MG tablet Take 1 tablet (20 mg total) by mouth daily. 90 tablet 3  . Cholecalciferol (VITAMIN D3) 5000 units CAPS Take 5,000 Units by mouth daily.    . Coenzyme Q10 (CO Q-10) 300 MG CAPS Take 300 mg by mouth daily.    . Cyanocobalamin (VITAMIN B-12) 5000 MCG TBDP Take 5,000 mcg by mouth daily.     . fexofenadine (ALLEGRA) 180 MG tablet Take 180 mg by mouth daily.    . Glucosamine-Chondroit-Vit C-Mn (GLUCOSAMINE 1500 COMPLEX PO) Take 1,500 mg by mouth daily.    . metoprolol succinate (TOPROL-XL) 50 MG 24 hr tablet TAKE 1 TABLET BY MOUTH DAILY. TAKE WITH OR IMMEDIATELY FOLLOWING A MEAL. 90 tablet 3  . Probiotic Product (PROBIOTIC PO) Take 1 tablet by mouth daily. PROBIOTICS 10    . ranitidine (ZANTAC) 150 MG capsule Take 300 mg by mouth daily.     . vitamin C (ASCORBIC ACID) 500 MG tablet Take 1,000 mg by mouth daily.    . vitamin E 400 UNIT capsule Take 800 Units by mouth daily.     Marland Kitchen CIALIS 20 MG tablet TAKE 1 TABLET BY MOUTH DAILY AS NEEDED FOR ERECTILE DYSFUNCTION. (Patient not taking: Reported on 04/27/2017) 6 tablet PRN   No current facility-administered medications for this visit.      Physical Exam: BP 119/77 (BP Location: Right Arm, Patient Position: Sitting, Cuff Size: Large)   Pulse 67    Resp 16   Ht 6' (1.829 m)   Wt 205 lb (93 kg)   SpO2 95% Comment: ON RA  BMI 27.80 kg/m  He looks well Cardiac exam shows a regular rate and rhythm with normal heart sounds. There is no murmur. Lungs are clear   Diagnostic Tests:  CT CORONARY MORPH W/CTA COR W/SCORE W/CA W/CM &/OR WO/CM (Accession [id number]) (Order [id number])  Imaging  Date: 04/27/2017 Department: [facility] CT IMAGING Released By: Ermalinda Barrios Authorizing: [name], MD  Exam Information   Status Exam Begun  Exam Ended   Final [99] 04/27/2017 9:04 AM 04/27/2017 9:22 AM  PACS Images   Show images for CT CORONARY MORPH W/CTA COR W/SCORE W/CA W/CM &/OR WO/CM  Addendum   ADDENDUM REPORT: 04/27/2017 15:20  CLINICAL DATA:  61 year old male with known aortic root dilatation, 10 months follow up study.  EXAM: Cardiac/Coronary CT/ Chest CTA  COMPARISON:  Chest CTA from 06/22/2016  TECHNIQUE: The patient was scanned on a [employer].  A 120 kV prospective scan was triggered in the descending thoracic aorta at 111 HU's. Axial non-contrast 3 mm slices were carried out through the heart. The data set was analyzed on a dedicated work station and scored using  the Agatson method. Gantry rotation speed was 250 msecs and collimation was .6 mm. No beta blockade and 0.8 mg of sl NTG was given. The 3D data set was reconstructed in 5% intervals of the 67-82 % of the R-R cycle. Diastolic phases were analyzed on a dedicated work station using MPR, MIP and VRT modes. The patient received 80 cc of contrast.  FINDINGS: Aortic Valve:  Trileaflet with no calcifications and no thickening.  Aorta: Dilated aortic root including sinotubular junction, normal size of the ascending aorta, aortic arch and descending aorta. No calcifications and no dissection.  Sinotubular Junction:  42 x 39 mm  Ascending Thoracic Aorta:  37 x 35 mm  Aortic Arch:  29 x 26 mm  Descending  Thoracic Aorta:  27 x 27 mm  Sinus of Valsalva Measurements:  Non-coronary:  43 mm  Right -coronary:  47 mm  Left -coronary:  49 mm  Coronary Arteries:  Normal coronary origin.  Right dominance.  RCA is a large dominant artery that gives rise to PDA and PLVB. There is minimal diffuse calcified plaque with associated stenosis 25-50%.  Left main is a large artery that gives rise to LAD and LCX arteries.  LAD is a large vessel that has gives rise to one small diagonal artery. There is mild diffuse calcified plaque in the proximal to mid LAD with maximum stenosis 25-50%.  LCX is a non-dominant artery that gives rise to one large OM1 branch. There is mild diffuse calcified plaque in the OM branch with maximum stenosis 25-50%.  Other findings:  Normal pulmonary vein drainage into the left atrium.  Normal let atrial appendage without a thrombus.  Normal size of the pulmonary artery.  IMPRESSION: 1. Dilated aortic root including sinotubular junction. Sinuses size 49 x 47 x 43 mm. The maximum diameter across sinuses measures 55 mm and is unchanged from the prior study on 06/22/2016.  2. Normal size of the ascending aorta, aortic arch and descending aorta. No calcifications and no dissection.  3. Coronary calcium score of 153. This was 49 percentile for age and sex matched control.  4. Normal coronary origin with right dominance. Mild non-obstructive plaque in the RCA, LAD and OM1. Aggressive risk factor modification is recommended.  5. Mildly dilated pulmonary artery measuring 34 mm suggestive of pulmonary hypertension.  6.  No ASD/VSD.  7.  No thrombus in the left atrial appendage.  Ena Dawley   Electronically Signed   By: Ena Dawley   On: 04/27/2017 15:20   Addended by Dorothy Spark, MD on 04/27/2017 3:23 PM    Study Result   EXAM: OVER-READ INTERPRETATION  CT CHEST  The following report is an over-read performed  by radiologist Dr. Forest Gleason [facility] on 04/27/2017. This over-read does not include interpretation of cardiac or coronary anatomy or pathology. The score/coronary CTA interpretation by the cardiologist is attached.  COMPARISON:  CTA of 06/22/2016  FINDINGS: Vascular: Bovine arch.  Tortuous thoracic aorta.  Aneurysmal dilatation at the level of the sinuses of Valsalva again identified. On transverse imaging, this measures 5.4 cm on image 41/series 11. Compare 5.2 cm at the same level on the prior. On coronal reformats, the aorta measures 4.9 cm on image 42 today, similar. At the level of the sino-tubular junction, the aorta measures 3.6 cm. Within the mid ascending segment, measures 3.4 cm.  No dissection. No central pulmonary embolism, on this non-dedicated study.  Mediastinum/Nodes: No mediastinal or hilar adenopathy. Fluid level in the  esophagus on image 54/series 11.  Lungs/Pleura: No pleural fluid.  Clear imaged lungs.  Upper Abdomen: Normal imaged portions of the liver, stomach. A peripherally enhancing splenic lesion is similar in size at 1.8 cm and is likely a hemangioma  Musculoskeletal: Interval left lateral seventh rib nonacute fracture.  IMPRESSION: 1. Similar aneurysmal dilatation of the ascending aorta at the level of the sinuses of Valsalva. 2. Esophageal air fluid level suggests dysmotility or gastroesophageal reflux. 3. Similar splenic lesion, likely a hemangioma.  Electronically Signed: By: Abigail Miyamoto M.D. On: 04/27/2017 10:23       CT CORONARY FRACTIONAL FLOW RESERVE DATA PREP (Accession [id number]) (Order [id number])  Imaging  Date: 04/28/2017 Department: [facility] CT IMAGING Released By: Higinio Roger, RT Authorizing: [name], MD  Exam Information   Status Exam Begun  Exam Ended   Final [99] 04/28/2017 4:16 PM 04/28/2017 4:17 PM  PACS Images   Show images for CT CORONARY  FRACTIONAL FLOW RESERVE DATA PREP  Study Result   EXAM: FF/RCT ANALYSIS  FINDINGS: FFRct analysis was performed on the original cardiac CT angiogram dataset. Diagrammatic representation of the FFRct analysis is provided in a separate PDF document in PACS. This dictation was created using the PDF document and an interactive 3D model of the results. 3D model is not available in the EMR/PACS. Normal FFR range is >0.80.  1. Left Main:  No significant stenosis.  2. LAD: Proximal FFR: 0.95. Mid LAD FFR: 0.88. Distal CT FFR: 0.84. 3. LCX:  Proximal LCX FFR:  0.94.  Distal LCX FFR:  0.84. 4. OM1 CT FFR: No significant stenosis. 5. RCA: No significant stenosis.  IMPRESSION: 1.  CT FFR analysis didn't show any significant stenosis.  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 The maximum orthogonal measurement across the sinuses is 49 mm across the left coronary sinus but the maximum diameter is 55 mm if measured across the widest part of the asymmetric sinuses. Echo has shown no AI. 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 I have reviewed all of his prior studies and I think this aneurysm is stable but it is at the diameter that surgical repair is recommended bases on the current study. This aneurysm is of the root phenotype which probably increases his risk of aortic dissection. His BP is well-controlled. I reviewed the images with him and his wife again and answered their questions. I don't think he needs to have a cardiac cath since the cardiac CT did not show any significant disease in the coronary arteries. I discussed the surgical repair with valve sparing root replacement. I discussed alternatives, benefits and risks; including but not limited to bleeding, blood transfusion, infection, stroke, myocardial infarction, graft failure,  heart block requiring a permanent pacemaker, organ dysfunction, and death.  [name] understands and agrees to proceed.    Plan:  He will call us to schedule valve sparing root replacement after discussing it further with his wife and family.  [name], MD [facility] [phone]

## 2017-05-19 ENCOUNTER — Ambulatory Visit (INDEPENDENT_AMBULATORY_CARE_PROVIDER_SITE_OTHER): Payer: 59 | Admitting: Cardiology

## 2017-05-19 ENCOUNTER — Encounter: Payer: Self-pay | Admitting: Cardiology

## 2017-05-19 ENCOUNTER — Ambulatory Visit: Payer: 59 | Admitting: Cardiology

## 2017-05-19 VITALS — BP 126/78 | HR 67 | Ht 72.0 in | Wt 213.0 lb

## 2017-05-19 DIAGNOSIS — I77810 Thoracic aortic ectasia: Secondary | ICD-10-CM | POA: Diagnosis not present

## 2017-05-19 DIAGNOSIS — E785 Hyperlipidemia, unspecified: Secondary | ICD-10-CM

## 2017-05-19 DIAGNOSIS — I10 Essential (primary) hypertension: Secondary | ICD-10-CM

## 2017-05-19 NOTE — Progress Notes (Signed)
PCP: Lawrence Frizzle, MD  Clinic Note: Chief Complaint  Patient presents with  . Follow-up    dilated thoracic aortic aneurysm  . Shortness of Breath    HPI: Lawrence Liu is a 61 y.o. male with a PMH below who presents today for Routine 6 month follow-up of dilated aortic root He is a former patient of Lawrence Liu who we was following for episodes of chest tightness thought to be potentially anginal equivalent. He was hospitalized in March 2015 visit chest tightness. Echocardiogram with normal EF of 60%, but noted to have a dilated aortic root with no AI. Cardiac catheterization showed only mild coronary disease.Lawrence Liu was last seen in June 2017- had a CT angiogram ordered at that time with planned follow-up now.  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 Max diameters 5.5 cm- identical to 2015  Echo 09/2016: EF 55-60%. Gr1 DD. Stable Aortic Valve.  Cor CTA-FFR 1. Left Main:  No significant stenosis.  2. LAD: Proximal FFR: 0.95. Mid LAD FFR: 0.88. Distal CT FFR: 0.84. 3. LCX:  Proximal LCX FFR:  0.94.  Distal LCX FFR:  0.84. 4. OM1 CT FFR: No significant stenosis. 5. RCA: No significant stenosis.  IMPRESSION: 1.  CT FFR analysis didn't show any significant stenosis.   Interval History: Lawrence Liu presents today for Delayed 6-8 month follow-up and doing fairly well. He continues to be active both at work and with trying to exercise. He walks his dog almost every day. He does play golf, and is trying to pay more attention to walking most of the way.  With this walking, he denies any symptoms of chest tightness, pressure or dyspnea. For the most part he denies any resting or exertional chest tightness/pressure or dyspnea. If he exerts himself a lot, he may note some dyspnea, but nothing that is all limiting. He also denies  any heart failure symptoms of PND, orthopnea or edema.   Cardiac Review of Symptoms as follows:  No palpitations, lightheadedness, dizziness, weakness or syncope/near syncope.  No TIA/amaurosis fugax symptoms.  No claudication.  ROS: A comprehensive was performed. Review of Systems  Constitutional: Negative for malaise/fatigue.  HENT: Negative for nosebleeds.   Eyes: Negative for blurred vision and double vision.  Respiratory: Negative for cough, shortness of breath and wheezing.   Cardiovascular:       Per history of present illness. Negative  Gastrointestinal: Positive for heartburn (relatively well-controlled on ranitidine). Negative for blood in stool and melena.  Genitourinary: Negative for hematuria.  Musculoskeletal: Positive for joint pain (Arthritis pains). Negative for myalgias.  Neurological: Negative for dizziness, loss of consciousness and headaches.  Endo/Heme/Allergies: Does not bruise/bleed easily.  Psychiatric/Behavioral: Negative for depression and memory loss. The patient is not nervous/anxious and does not have insomnia.   All other systems reviewed and are negative.   Past Medical History:  Diagnosis Date  . Alpha galactosidase deficiency    multiple tick bites  . Aortic root dilatation ([place]) 11/06/2013   CTA of chest November 2016: aorta at sinus of Valsalva/sinotubular junction/ascending aorta are 5.2 cm, 4.0 cm, 3.7 cm -> little change from prior study  . Deep vein thrombosis ([place]) 2004   Left deep vein thrombosis of left calf  . GERD (gastroesophageal reflux disease)   . History of blood transfusion    as a baby for jaundice  .  Hypertension   . 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 No lesions greater than 30% noted.  . Pneumonia 1980's   walking pneumonia    Past Surgical History:  Procedure Laterality Date  . COLONOSCOPY WITH PROPOFOL N/A 11/23/2016   Procedure: COLONOSCOPY WITH PROPOFOL;  Surgeon:  Juanita Craver, MD;  Location: [facility];  Service: Endoscopy;  Laterality: N/A;  . colonscopy  5 1/2 yrs ago   polyps removed  . LEFT HEART CATHETERIZATION WITH CORONARY ANGIOGRAM N/A 10/03/2013   Procedure: LEFT HEART CATHETERIZATION WITH CORONARY ANGIOGRAM;  Surgeon: Jolaine Artist, MD;  Location: [facility] CATH LAB;  Service: Cardiovascular:  Nonobstructive disease - LAD diffuse 20-30%. Small distal vessel. Circumflex ostial 30-40%. Large OM 1 with 30% mid. Small OM 2 and several small PL branches. Large dominant RCA with 30% mid and diffuse 20-30% distal.  . SHOULDER ARTHROSCOPY  07/15/2011   Procedure: ARTHROSCOPY SHOULDER;  Surgeon: Metta Clines Supple;  Location: [facility];  Service: Orthopedics;  Laterality: Right;  RIGHT ARTHROSCOPY SUBACROMIAL DECOMPRESSION AND DISTAL CLAVICLE RESECTION  . TONSILLECTOMY  ~ 1970   adenoids also  . TRANSTHORACIC ECHOCARDIOGRAM  March 2015   Upper limit size LV. Mild LVH. EF 60%. Normal wall motion. Ascending aorta measures 48 mm at root tapering to 40 mm at proximal ascending. Mild RV dilation.    Current Meds  Medication Sig  . amLODipine-benazepril (LOTREL) 5-20 MG capsule TAKE 1 CAPSULE BY MOUTH DAILY.  Marland Kitchen aspirin EC 81 MG tablet Take 81 mg by mouth daily.  Marland Kitchen atorvastatin (LIPITOR) 20 MG tablet Take 1 tablet (20 mg total) by mouth daily.  . Cholecalciferol (VITAMIN D3) 5000 units CAPS Take 5,000 Units by mouth daily.  Marland Kitchen CIALIS 20 MG tablet TAKE 1 TABLET BY MOUTH DAILY AS NEEDED FOR ERECTILE DYSFUNCTION.  Marland Kitchen Coenzyme Q10 (CO Q-10) 300 MG CAPS Take 300 mg by mouth daily.  . Cyanocobalamin (VITAMIN B-12) 5000 MCG TBDP Take 5,000 mcg by mouth daily.   . fexofenadine (ALLEGRA) 180 MG tablet Take 180 mg by mouth daily.  . Glucosamine-Chondroit-Vit C-Mn (GLUCOSAMINE 1500 COMPLEX PO) Take 1,500 mg by mouth daily.  . metoprolol succinate (TOPROL-XL) 50 MG 24 hr tablet TAKE 1 TABLET BY MOUTH DAILY. TAKE WITH OR IMMEDIATELY FOLLOWING A MEAL.  . Probiotic Product (PROBIOTIC  PO) Take 1 tablet by mouth daily. PROBIOTICS 10  . ranitidine (ZANTAC) 150 MG capsule Take 300 mg by mouth daily.   . vitamin C (ASCORBIC ACID) 500 MG tablet Take 1,000 mg by mouth daily.  . vitamin E 400 UNIT capsule Take 800 Units by mouth daily.     Allergies  Allergen Reactions  . Latex Anaphylaxis  . Penicillins     Causes hives when combined with sulfa, can take penicillin by itself  . Sulfa Antibiotics Other (See Comments)    Hives...only when mixed sulfa and pencillin together     Social History   Social History  . Marital status: Married    Spouse name: N/A  . Number of children: N/A  . Years of education: N/A   Social History Main Topics  . Smoking status: Never Smoker  . Smokeless tobacco: Never Used  . Alcohol use Yes     Comment: 10/02/2013 "hard apple cider maybe once/month"  . Drug use: No  . Sexual activity: Yes   Other Topics Concern  . None   Social History Narrative  . None   family history includes Diabetes in his father and  mother; Heart disease in his father.   Wt Readings from Last 3 Encounters:  05/19/17 213 lb (96.6 kg)  05/11/17 205 lb (93 kg)  04/27/17 205 lb (93 kg)    PHYSICAL EXAM BP 126/78   Pulse 67   Ht 6' (1.829 m)   Wt 213 lb (96.6 kg)   BMI 28.89 kg/m   Physical Exam  Constitutional: He is oriented to person, place, and time. He appears well-developed and well-nourished. No distress.  Healthy-appearing  Eyes: EOM are normal. No scleral icterus.  Neck: Normal range of motion. Neck supple. No hepatojugular reflux and no JVD present. Carotid bruit is not present.  Cardiovascular: Normal rate, regular rhythm, normal heart sounds and intact distal pulses.   No extrasystoles are present. PMI is not displaced.  Exam reveals no gallop and no friction rub.   No murmur heard. Pulmonary/Chest: Effort normal and breath sounds normal. No respiratory distress. He has no wheezes. He has no rales.  Abdominal: Soft. Bowel sounds are  normal. He exhibits no distension. There is no tenderness. There is no rebound.  Musculoskeletal: Normal range of motion. He exhibits no edema.  Neurological: He is alert and oriented to person, place, and time.  Skin: Skin is warm and dry. No erythema.  Psychiatric: He has a normal mood and affect. His behavior is normal. Judgment and thought content normal.  Nursing note and vitals reviewed.    Adult ECG Report Not checked   Other studies Reviewed: Additional studies/ records that were reviewed today include:  Recent Labs:   Lab Results  Component Value Date   CHOL 105 10/18/2016   HDL 34 (L) 10/18/2016   LDLCALC 47 10/18/2016   TRIG 122 10/18/2016   CHOLHDL 3.1 10/18/2016   Lab Results  Component Value Date   CREATININE 0.85 10/18/2016    ASSESSMENT / PLAN:  (Plan Surgery in January)  Problem List Items Addressed This Visit    Aortic root dilatation ([place]) - Primary (Chronic)    With progressively increasing size and is thoracic aortic aneurysm, he has now met with Dr. Cyndia Bent and they're planning to proceed with his surgery tentatively scheduling forward January timeframe. I will see him back in follow-up after his surgery.  His blood pressure is well-controlled, and he remains asymptomatic.       Relevant Orders   EKG 12-Lead (Completed)   Atherogenic dyslipidemia (Chronic)    Most recent lipid panel from March looks outstanding. Continue current dose of atorvastatin and recheck postop (roughly March 2019)      Relevant Orders   EKG 12-Lead (Completed)   Essential hypertension (Chronic)    Well-controlled on Lotrel and Toprol. No complaints of fatigue or Dizziness.      Relevant Orders   EKG 12-Lead (Completed)      Current medicines are reviewed at length with the patient today. (+/- concerns) none The following changes have been made: None Patient Instructions  Your physician wants you to follow-up in: February 2019 with Dr. Ellyn Hack. You will  receive a reminder letter in the mail two months in advance. If you don't receive a letter, please call our office to schedule the follow-up appointment.   Studies Ordered:   Orders Placed This Encounter  Procedures  . EKG 12-Lead     Glenetta Hew, M.D., M.S. Interventional Cardiologist   Pager # [phone] Phone # [phone] [address] [place],  [address]

## 2017-05-19 NOTE — Patient Instructions (Signed)
Your physician wants you to follow-up in: February 2019 with Dr. Ellyn Hack. You will receive a reminder letter in the mail two months in advance. If you don't receive a letter, please call our office to schedule the follow-up appointment.

## 2017-05-20 ENCOUNTER — Telehealth: Payer: Self-pay | Admitting: Family Medicine

## 2017-05-20 NOTE — Telephone Encounter (Signed)
Patient calling to discuss an upcoming surgery  Please call him at [phone]

## 2017-05-21 ENCOUNTER — Encounter: Payer: Self-pay | Admitting: Cardiology

## 2017-05-21 NOTE — Assessment & Plan Note (Signed)
With progressively increasing size and is thoracic aortic aneurysm, he has now met with Dr. Cyndia Bent and they're planning to proceed with his surgery tentatively scheduling forward January timeframe. I will see him back in follow-up after his surgery.  His blood pressure is well-controlled, and he remains asymptomatic.

## 2017-05-21 NOTE — Assessment & Plan Note (Signed)
Well-controlled on Lotrel and Toprol. No complaints of fatigue or Dizziness.

## 2017-05-21 NOTE — Assessment & Plan Note (Signed)
Most recent lipid panel from March looks outstanding. Continue current dose of atorvastatin and recheck postop (roughly March 2019)

## 2017-05-23 ENCOUNTER — Other Ambulatory Visit: Payer: Self-pay

## 2017-05-23 DIAGNOSIS — I719 Aortic aneurysm of unspecified site, without rupture: Secondary | ICD-10-CM

## 2017-05-23 DIAGNOSIS — I7121 Aneurysm of the ascending aorta, without rupture: Secondary | ICD-10-CM

## 2017-05-23 NOTE — Telephone Encounter (Signed)
Called and spoke to pt and he just wanted to make sure you were aware of his upcoming open heart surgery and to keep you in the loop about his health.

## 2017-07-11 ENCOUNTER — Other Ambulatory Visit: Payer: Self-pay | Admitting: Family Medicine

## 2017-07-13 MED FILL — METOPROLOL SUCC ER 50 MG TA: 50 | 90 days supply | Qty: 90 | Fill #0

## 2017-07-13 NOTE — Telephone Encounter (Signed)
Medication refilled per protocol. 

## 2017-07-20 MED FILL — CLINDAMYCIN HCL 150 MG CAPS: 150 | 7 days supply | Qty: 28 | Fill #0

## 2017-07-20 MED FILL — HYDROCODON-APAP 5-325: 5-325 | 4 days supply | Qty: 15 | Fill #0

## 2017-07-29 MED FILL — CLINDAMYCIN HCL 300 MG CAPS: 300 | 6 days supply | Qty: 18 | Fill #0

## 2017-08-01 ENCOUNTER — Other Ambulatory Visit: Payer: Self-pay | Admitting: Cardiology

## 2017-08-01 MED FILL — AMLODIPINE-BENAZEPRIL 5-20: 5-20 | 90 days supply | Qty: 90 | Fill #0

## 2017-08-09 ENCOUNTER — Other Ambulatory Visit: Payer: Self-pay | Admitting: *Deleted

## 2017-08-09 MED FILL — ATORVASTATIN 20 MG TABLET: 20 | 90 days supply | Qty: 90 | Fill #3

## 2017-08-09 NOTE — Patient Outreach (Signed)
[facility]) Care Management  08/09/2017  DORANCE SPINK 01-24-1956 [id number]   Subjective: Telephone call to patient's home number, no answer, left HIPAA compliant voicemail message, and requested call back.    Objective: Per KPN (Knowledge Performance Now, point of care tool) and chart review, patient to be admitted 08/15/17 for ASCENDING AORTIC ROOT REPLACEMENT, (VALVE SPARING ROOT REPLACEMENT) at [facility].      Assessment: Received [facility] Preoperative / Transition of care referral on 08/01/17.   Preoperative call follow up pending patient contact.     Plan: RNCM will call patient for 2nd telephone outreach attempt, preoperative call follow up, within 10 business days if no return call.     Caprisha Bridgett H. Annia Friendly, BSN, [place] Management [facility] Telephonic CM Phone: [phone] Fax: [phone]

## 2017-08-10 ENCOUNTER — Other Ambulatory Visit: Payer: Self-pay | Admitting: *Deleted

## 2017-08-10 ENCOUNTER — Ambulatory Visit: Payer: Self-pay | Admitting: *Deleted

## 2017-08-10 NOTE — Patient Outreach (Signed)
[facility]) Care Management  08/10/2017  Lawrence Liu 1956-05-14 [id number]   Subjective: Received voicemail message from Mr. Lawrence Liu, states he is returning call, and requested call back. Telephone call to patient's home number, spoke with patient, and HIPAA verified.  Discussed [facility] Care Management UMR Transition of care follow up, preoperative call follow up, patient voiced understanding, and is in agreement to both types of follow up.  Patient gave [facility] verbal consent to speak with wife Lawrence Liu) regarding his healthcare needs as needed. Patient placed call on speaker phone, RNCM spoke with patient and wife.  States he is prepared for surgery on 08/15/17 at [facility], estimated length of stay 5 - 7 days, and wife will assist as needed with activities of daily living / home management. Patient voices understanding of medical diagnosis, pending surgery, and treatment plan.   Patient states he and wife are [employer].  States he is accessing the following [facility] benefits: outpatient pharmacy, hospital indemnity, short term disability, and has family medical leave act (FMLA) in place.  Patient verbally given contact information for [place] [phone]) to contact regarding Advanced Directives document completion benefit for employees, voices understanding, and states he will follow up.  Patient states he does not have any preoperative questions, care coordination, disease management, disease monitoring, transportation, community resource, or pharmacy needs at this time.  States he is very appreciative of the follow up and is in agreement to receive [facility] Management information post transition of care follow up.     Objective: Per KPN (Knowledge Performance Now, point of care tool) and chart review, patient to be admitted 08/15/17 for ASCENDING AORTIC ROOT REPLACEMENT, (VALVE SPARING ROOT REPLACEMENT) at [facility].       Assessment: Received UMR Preoperative / Transition of care referral on 08/01/17.   Preoperative call completed, and transition of care follow up pending notification of patient discharge.     Plan: RNCM will call patient for  telephone outreach attempt, transition of care follow up, within 3 business days of hospital discharge notification.     Angelie Kram H. Annia Friendly, BSN, [facility] Telephonic CM Phone: [phone] Fax: [phone]

## 2017-08-11 NOTE — Pre-Procedure Instructions (Signed)
Lawrence Liu  08/11/2017      [place], [place] - [address] [facility]. [address] [place] [address] Phone: [phone] Fax: [phone]    Your procedure is scheduled on Jan 14  Report to [place] at 530 A.M.  Call this number if you have problems the morning of surgery:  [phone]   Remember:  Do not eat food or drink liquids after midnight.  Take these medicines the morning of surgery with A SIP OF WATER Metoprolol succinate (Toprol-XL), Ranitidine (Zantac), allegra   Stop taking BC's, Goody's, Herbal medications, Fish Oil, Ibuprofen, Advil, Motrin, Aleve, Vitamins   Stop taking aspirin as directed by your Dr.     Lazaro Arms not wear jewelry, make-up or nail polish.  Do not wear lotions, powders, or perfumes, or deodorant.  Do not shave 48 hours prior to surgery.  Men may shave face and neck.  Do not bring valuables to the hospital.  [facility] is not responsible for any belongings or valuables.  Contacts, dentures or bridgework may not be worn into surgery.  Leave your suitcase in the car.  After surgery it may be brought to your room.  For patients admitted to the hospital, discharge time will be determined by your treatment team.  Patients discharged the day of surgery will not be allowed to drive home.    Special instructions:  [facility] - Preparing for Surgery  Before surgery, you can play an important role.  Because skin is not sterile, your skin needs to be as free of germs as possible.  You can reduce the number of germs on you skin by washing with CHG (chlorahexidine gluconate) soap before surgery.  CHG is an antiseptic cleaner which kills germs and bonds with the skin to continue killing germs even after washing.  Please DO NOT use if you have an allergy to CHG or antibacterial soaps.  If your skin becomes reddened/irritated stop using the CHG and inform your nurse when you arrive at  Short Stay.  Do not shave (including legs and underarms) for at least 48 hours prior to the first CHG shower.  You may shave your face.  Please follow these instructions carefully:   1.  Shower with CHG Soap the night before surgery and the                                morning of Surgery.  2.  If you choose to wash your hair, wash your hair first as usual with your       normal shampoo.  3.  After you shampoo, rinse your hair and body thoroughly to remove the                      Shampoo.  4.  Use CHG as you would any other liquid soap.  You can apply chg directly       to the skin and wash gently with scrungie or a clean washcloth.  5.  Apply the CHG Soap to your body ONLY FROM THE NECK DOWN.        Do not use on open wounds or open sores.  Avoid contact with your eyes,       ears, mouth and genitals (private parts).  Wash genitals (private parts)       with your normal soap.  6.  Wash thoroughly, paying special attention to the area where your surgery        will be performed.  7.  Thoroughly rinse your body with warm water from the neck down.  8.  DO NOT shower/wash with your normal soap after using and rinsing off       the CHG Soap.  9.  Pat yourself dry with a clean towel.            10.  Wear clean pajamas.            11.  Place clean sheets on your bed the night of your first shower and do not        sleep with pets.  Day of Surgery  Do not apply any lotions/deoderants the morning of surgery.  Please wear clean clothes to the hospital/surgery center.     Please read over the following fact sheets that you were given. Pain Booklet, Coughing and Deep Breathing, MRSA Information and Surgical Site Infection Prevention

## 2017-08-12 ENCOUNTER — Other Ambulatory Visit: Payer: Self-pay

## 2017-08-12 ENCOUNTER — Ambulatory Visit (HOSPITAL_COMMUNITY)
Admission: RE | Admit: 2017-08-12 | Discharge: 2017-08-12 | Disposition: A | Discharge status: Home / Self Care | Payer: 59 | Source: Ambulatory Visit | Attending: Surgery | Admitting: Surgery

## 2017-08-12 ENCOUNTER — Ambulatory Visit (HOSPITAL_BASED_OUTPATIENT_CLINIC_OR_DEPARTMENT_OTHER)
Admission: RE | Admit: 2017-08-12 | Discharge: 2017-08-12 | Disposition: A | Discharge status: Home / Self Care | Payer: 59 | Source: Ambulatory Visit | Attending: Surgery | Admitting: Surgery

## 2017-08-12 ENCOUNTER — Encounter (HOSPITAL_COMMUNITY): Payer: Self-pay

## 2017-08-12 ENCOUNTER — Encounter (HOSPITAL_COMMUNITY)
Admission: RE | Admit: 2017-08-12 | Discharge: 2017-08-12 | Disposition: A | Discharge status: Home / Self Care | Payer: 59 | Source: Ambulatory Visit | Attending: Surgery | Admitting: Surgery

## 2017-08-12 DIAGNOSIS — I7121 Aneurysm of the ascending aorta, without rupture: Secondary | ICD-10-CM

## 2017-08-12 DIAGNOSIS — I719 Aortic aneurysm of unspecified site, without rupture: Secondary | ICD-10-CM | POA: Insufficient documentation

## 2017-08-12 DIAGNOSIS — Z01818 Encounter for other preprocedural examination: Secondary | ICD-10-CM

## 2017-08-12 DIAGNOSIS — Z01812 Encounter for preprocedural laboratory examination: Secondary | ICD-10-CM | POA: Diagnosis not present

## 2017-08-12 DIAGNOSIS — Z01810 Encounter for preprocedural cardiovascular examination: Secondary | ICD-10-CM | POA: Diagnosis not present

## 2017-08-12 DIAGNOSIS — J449 Chronic obstructive pulmonary disease, unspecified: Secondary | ICD-10-CM | POA: Diagnosis not present

## 2017-08-12 DIAGNOSIS — I6523 Occlusion and stenosis of bilateral carotid arteries: Secondary | ICD-10-CM | POA: Insufficient documentation

## 2017-08-12 LAB — BLOOD GAS, ARTERIAL
Acid-base deficit: 0.7 mmol/L (ref 0.0–2.0)
BICARBONATE: 23.3 mmol/L (ref 20.0–28.0)
Drawn by: 470591
FIO2: 0.21
O2 Saturation: 95.2 %
PCO2 ART: 37.7 mmHg (ref 32.0–48.0)
PH ART: 7.408 (ref 7.350–7.450)
PO2 ART: 78.9 mmHg — AB (ref 83.0–108.0)
Patient temperature: 98.6

## 2017-08-12 LAB — PULMONARY FUNCTION TEST
DL/VA % pred: 88 %
DL/VA: 4.23 ml/min/mmHg/L
DLCO unc % pred: 76 %
DLCO unc: 27.76 ml/min/mmHg
FEF 25-75 Post: 3.79 L/s
FEF 25-75 Pre: 3.38 L/s
FEF2575-%Change-Post: 12 %
FEF2575-%Pred-Post: 114 %
FEF2575-%Pred-Pre: 102 %
FEV1-%Change-Post: 2 %
FEV1-%Pred-Post: 94 %
FEV1-%Pred-Pre: 91 %
FEV1-Post: 3.8 L
FEV1-Pre: 3.69 L
FEV1FVC-%Change-Post: 4 %
FEV1FVC-%Pred-Pre: 104 %
FEV6-%Change-Post: -1 %
FEV6-%Pred-Post: 90 %
FEV6-%Pred-Pre: 91 %
FEV6-Post: 4.57 L
FEV6-Pre: 4.64 L
FEV6FVC-%Pred-Post: 104 %
FEV6FVC-%Pred-Pre: 104 %
FVC-%Change-Post: -1 %
FVC-%Pred-Post: 86 %
FVC-%Pred-Pre: 87 %
FVC-Post: 4.57 L
FVC-Pre: 4.64 L
Post FEV1/FVC ratio: 83 %
Post FEV6/FVC ratio: 100 %
Pre FEV1/FVC ratio: 80 %
Pre FEV6/FVC Ratio: 100 %
RV % pred: 52 %
RV: 1.25 L
TLC % pred: 82 %
TLC: 6.28 L

## 2017-08-12 LAB — TYPE AND SCREEN
ABO/RH(D): B POS
ANTIBODY SCREEN: NEGATIVE

## 2017-08-12 LAB — COMPREHENSIVE METABOLIC PANEL
ALK PHOS: 60 U/L (ref 38–126)
ALT: 21 U/L (ref 17–63)
ANION GAP: 9 (ref 5–15)
AST: 17 U/L (ref 15–41)
Albumin: 4 g/dL (ref 3.5–5.0)
BILIRUBIN TOTAL: 0.8 mg/dL (ref 0.3–1.2)
BUN: 19 mg/dL (ref 6–20)
CALCIUM: 9.2 mg/dL (ref 8.9–10.3)
CO2: 20 mmol/L — ABNORMAL LOW (ref 22–32)
CREATININE: 0.82 mg/dL (ref 0.61–1.24)
Chloride: 110 mmol/L (ref 101–111)
Glucose, Bld: 109 mg/dL — ABNORMAL HIGH (ref 65–99)
Potassium: 3.8 mmol/L (ref 3.5–5.1)
Sodium: 139 mmol/L (ref 135–145)
TOTAL PROTEIN: 6.6 g/dL (ref 6.5–8.1)

## 2017-08-12 LAB — URINALYSIS, ROUTINE W REFLEX MICROSCOPIC
BILIRUBIN URINE: NEGATIVE
Glucose, UA: NEGATIVE mg/dL
Hgb urine dipstick: NEGATIVE
KETONES UR: NEGATIVE mg/dL
LEUKOCYTES UA: NEGATIVE
NITRITE: NEGATIVE
PH: 5 (ref 5.0–8.0)
Protein, ur: NEGATIVE mg/dL
SPECIFIC GRAVITY, URINE: 1.027 (ref 1.005–1.030)

## 2017-08-12 LAB — SURGICAL PCR SCREEN
MRSA, PCR: NEGATIVE
Staphylococcus aureus: NEGATIVE

## 2017-08-12 LAB — CBC
HEMATOCRIT: 41.5 % (ref 39.0–52.0)
HEMOGLOBIN: 14 g/dL (ref 13.0–17.0)
MCH: 27.7 pg (ref 26.0–34.0)
MCHC: 33.7 g/dL (ref 30.0–36.0)
MCV: 82 fL (ref 78.0–100.0)
Platelets: 209 10*3/uL (ref 150–400)
RBC: 5.06 MIL/uL (ref 4.22–5.81)
RDW: 14.2 % (ref 11.5–15.5)
WBC: 6.4 10*3/uL (ref 4.0–10.5)

## 2017-08-12 LAB — ABO/RH: ABO/RH(D): B POS

## 2017-08-12 LAB — HEMOGLOBIN A1C
Hgb A1c MFr Bld: 5.8 % — ABNORMAL HIGH (ref 4.8–5.6)
MEAN PLASMA GLUCOSE: 119.76 mg/dL

## 2017-08-12 LAB — APTT: aPTT: 27 seconds (ref 24–36)

## 2017-08-12 LAB — PROTIME-INR
INR: 0.97
PROTHROMBIN TIME: 12.8 s (ref 11.4–15.2)

## 2017-08-12 MED ORDER — ALBUTEROL SULFATE (2.5 MG/3ML) 0.083% IN NEBU
2.5000 mg | INHALATION_SOLUTION | Freq: Once | RESPIRATORY_TRACT | Status: AC
  Administered 2017-08-12: 2.5 mg via RESPIRATORY_TRACT

## 2017-08-12 NOTE — Progress Notes (Signed)
Pre-CABG testing has been completed. 1-39% ICA stenosis bilaterally.  08/12/17 10:31 AM Lawrence Liu RVT

## 2017-08-12 NOTE — Progress Notes (Signed)
PCP is Dr Jenna Luo Cardiologist is Dr Glenetta Hew  Denies chest pain, fever, or cough. Card cath 2015 Echo 2018

## 2017-08-12 NOTE — Progress Notes (Signed)
   08/12/17 1112  OBSTRUCTIVE SLEEP APNEA  Have you ever been diagnosed with sleep apnea through a sleep study? No  Do you snore loudly (loud enough to be heard through closed doors)?  1  Do you often feel tired, fatigued, or sleepy during the daytime (such as falling asleep during driving or talking to someone)? 0  Has anyone observed you stop breathing during your sleep? 0  Do you have, or are you being treated for high blood pressure? 1  BMI more than 35 kg/m2? 0  Age > 50 (1-yes) 1  Neck circumference greater than:Male 16 inches or larger, Male 17inches or larger? 1  Male Gender (Yes=1) 1  Obstructive Sleep Apnea Score 5  Score 5 or greater  Results sent to PCP

## 2017-08-14 MED ORDER — DEXTROSE 5 % IV SOLN
1.5000 g | INTRAVENOUS | Status: AC
  Administered 2017-08-15: 1.5 g via INTRAVENOUS
  Filled 2017-08-14: qty 1.5

## 2017-08-14 MED ORDER — HEPARIN SODIUM (PORCINE) 1000 UNIT/ML IJ SOLN
INTRAMUSCULAR | Status: DC
  Filled 2017-08-14: qty 30

## 2017-08-14 MED ORDER — VANCOMYCIN HCL 10 G IV SOLR
1500.0000 mg | INTRAVENOUS | Status: AC
  Administered 2017-08-15: 1500 mg via INTRAVENOUS
  Filled 2017-08-14: qty 1500

## 2017-08-14 MED ORDER — TRANEXAMIC ACID (OHS) PUMP PRIME SOLUTION
2.0000 mg/kg | INTRAVENOUS | Status: DC
  Filled 2017-08-14: qty 1.87

## 2017-08-14 MED ORDER — EPINEPHRINE PF 1 MG/ML IJ SOLN
0.0000 ug/min | INTRAVENOUS | Status: DC
  Filled 2017-08-14: qty 4

## 2017-08-14 MED ORDER — POTASSIUM CHLORIDE 2 MEQ/ML IV SOLN
80.0000 meq | INTRAVENOUS | Status: DC
  Filled 2017-08-14: qty 40

## 2017-08-14 MED ORDER — TRANEXAMIC ACID (OHS) BOLUS VIA INFUSION
15.0000 mg/kg | INTRAVENOUS | Status: AC
  Administered 2017-08-15: 1401 mg via INTRAVENOUS
  Filled 2017-08-14: qty 1401

## 2017-08-14 MED ORDER — DOPAMINE-DEXTROSE 3.2-5 MG/ML-% IV SOLN
0.0000 ug/kg/min | INTRAVENOUS | Status: DC
  Filled 2017-08-14: qty 250

## 2017-08-14 MED ORDER — MAGNESIUM SULFATE 50 % IJ SOLN
40.0000 meq | INTRAMUSCULAR | Status: DC
  Filled 2017-08-14: qty 9.85

## 2017-08-14 MED ORDER — TRANEXAMIC ACID 1000 MG/10ML IV SOLN
1.5000 mg/kg/h | INTRAVENOUS | Status: AC
  Administered 2017-08-15: 1.5 mg/kg/h via INTRAVENOUS
  Filled 2017-08-14: qty 25

## 2017-08-14 MED ORDER — CEFUROXIME SODIUM 750 MG IJ SOLR
750.0000 mg | INTRAMUSCULAR | Status: DC
  Filled 2017-08-14: qty 750

## 2017-08-14 MED ORDER — SODIUM CHLORIDE 0.9 % IV SOLN
30.0000 ug/min | INTRAVENOUS | Status: AC
  Administered 2017-08-15: 25 ug/min via INTRAVENOUS
  Filled 2017-08-14: qty 2

## 2017-08-14 MED ORDER — NITROGLYCERIN IN D5W 200-5 MCG/ML-% IV SOLN
2.0000 ug/min | INTRAVENOUS | Status: AC
  Administered 2017-08-15: 16 ug/min via INTRAVENOUS
  Filled 2017-08-14: qty 250

## 2017-08-14 MED ORDER — METOPROLOL TARTRATE 12.5 MG HALF TABLET: 12.5000 mg | ORAL_TABLET | Freq: Once | ORAL | Status: DC

## 2017-08-14 MED ORDER — DEXMEDETOMIDINE HCL IN NACL 400 MCG/100ML IV SOLN
0.1000 ug/kg/h | INTRAVENOUS | Status: AC
  Administered 2017-08-15: 0.7 ug/kg/h via INTRAVENOUS
  Filled 2017-08-14: qty 100

## 2017-08-14 MED ORDER — PLASMA-LYTE 148 IV SOLN
INTRAVENOUS | Status: DC
  Filled 2017-08-14: qty 2.5

## 2017-08-14 MED ORDER — SODIUM CHLORIDE 0.9 % IV SOLN
INTRAVENOUS | Status: AC
  Administered 2017-08-15: .9 [IU]/h via INTRAVENOUS
  Filled 2017-08-14: qty 1

## 2017-08-14 NOTE — H&P (Signed)
[address]       [place] [address]             [phone]      Cardiothoracic Surgery Admission History and Physical   Lawrence Liu is an 62 y.o. male.   Chief Complaint: Aortic root aneurysm HPI:   The patient is a 62 year old gentleman who I have been following since initially seeing him on 10/06/2016 when a  CTA of the chest on 06/22/2016 showed stable aneurysmal dilation of the aortic root but the radiologist thought that the diameter at the sinus level may be as large as 5.5 cm. We decided to do a gated cardiac CTA this time to get a more accurate measurement at the root level. This showed the diameter at the sinus level to be 49 x 47 x 43 mm with a maximum diameter across the sinuses of 55 mm and was felt to be unchanged from his previous study of 06/22/2016. His last echo on 10/18/2016 showed a trileaflet aortic valve with no stenosis or regurgitation. The aortic root was measured at 50 mm. He had a gated cardiac CT on 04/27/2017 which shows the diameter at the sinus level to be 49 x 47 x 43 mm with a maximum diameter across the sinuses of 55 mm and was felt to be unchanged from his previous study of 06/22/2016. He has been feeling well with no chest or back pain.   Past Medical History:  Diagnosis Date  . Alpha galactosidase deficiency    multiple tick bites  . Aortic root dilatation ([place]) 11/06/2013   CTA of chest November 2016: aorta at sinus of Valsalva/sinotubular junction/ascending aorta are 5.2 cm, 4.0 cm, 3.7 cm -> little change from prior study  . Deep vein thrombosis ([place]) 2004   Left deep vein thrombosis of left calf  . GERD (gastroesophageal reflux disease)   . History of blood transfusion    as a baby for jaundice  . Hypertension   . 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 No lesions greater than 30% noted.  . Pneumonia 1980's   walking pneumonia    Past Surgical History:  Procedure  Laterality Date  . COLONOSCOPY WITH PROPOFOL N/A 11/23/2016   Procedure: COLONOSCOPY WITH PROPOFOL;  Surgeon: Juanita Craver, MD;  Location: [facility];  Service: Endoscopy;  Laterality: N/A;  . colonscopy  5 1/2 yrs ago   polyps removed  . EYE SURGERY Bilateral   . LEFT HEART CATHETERIZATION WITH CORONARY ANGIOGRAM N/A 10/03/2013   Procedure: LEFT HEART CATHETERIZATION WITH CORONARY ANGIOGRAM;  Surgeon: Jolaine Artist, MD;  Location: [facility] CATH LAB;  Service: Cardiovascular:  Nonobstructive disease - LAD diffuse 20-30%. Small distal vessel. Circumflex ostial 30-40%. Large OM 1 with 30% mid. Small OM 2 and several small PL branches. Large dominant RCA with 30% mid and diffuse 20-30% distal.  . SHOULDER ARTHROSCOPY  07/15/2011   Procedure: ARTHROSCOPY SHOULDER;  Surgeon: Metta Clines Supple;  Location: [facility];  Service: Orthopedics;  Laterality: Right;  RIGHT ARTHROSCOPY SUBACROMIAL DECOMPRESSION AND DISTAL CLAVICLE RESECTION  . TONSILLECTOMY  ~ 1970   adenoids also  . TRANSTHORACIC ECHOCARDIOGRAM  March 2015   Upper limit size LV. Mild LVH. EF 60%. Normal wall motion. Ascending aorta measures 48 mm at root tapering to 40 mm at proximal ascending. Mild RV dilation.    Family History  Problem Relation Age of Onset  . Heart disease Father   .  Diabetes Father   . Diabetes Mother    Social History:  reports that  has never smoked. he has never used smokeless tobacco. He reports that he drinks alcohol. He reports that he does not use drugs.  Allergies:  Allergies  Allergen Reactions  . Latex Anaphylaxis  . Penicillins Other (See Comments)    Causes hives when combined with sulfa, CAN take penicillin by itself  . Sulfa Antibiotics Other (See Comments)    Hives...only when mixed sulfa and pencillin together    No medications prior to admission.    No results found for this or any previous visit (from the past 48 hour(s)). No results found.  Review of Systems  Constitutional: Negative.     HENT: Negative.   Eyes: Negative.   Respiratory: Negative.   Cardiovascular: Negative.   Gastrointestinal: Negative.   Genitourinary: Negative.   Musculoskeletal: Negative.   Skin: Negative.   Neurological: Negative.   Endo/Heme/Allergies: Negative.   Psychiatric/Behavioral: Negative.     There were no vitals taken for this visit. Physical Exam  Constitutional: He is oriented to person, place, and time. He appears well-developed and well-nourished. No distress.  HENT:  Head: Normocephalic and atraumatic.  Mouth/Throat: Oropharynx is clear and moist.  Eyes: Conjunctivae and EOM are normal. Pupils are equal, round, and reactive to light.  Neck: Normal range of motion. Neck supple. No JVD present. No thyromegaly present.  Cardiovascular: Normal rate, regular rhythm, normal heart sounds and intact distal pulses.  No murmur heard. Respiratory: Effort normal and breath sounds normal. No respiratory distress.  GI: Soft. Bowel sounds are normal. He exhibits no distension. There is no tenderness.  Musculoskeletal: Normal range of motion. He exhibits no edema.  Lymphadenopathy:    He has no cervical adenopathy.  Neurological: He is alert and oriented to person, place, and time. He has normal strength. No cranial nerve deficit or sensory deficit.  Skin: Skin is warm and dry.  Psychiatric: He has a normal mood and affect.    CLINICAL DATA:  62 year old male with known aortic root dilatation, 10 months follow up study.  EXAM: Cardiac/Coronary CT/ Chest CTA  COMPARISON:  Chest CTA from 06/22/2016  TECHNIQUE: The patient was scanned on a [employer].  A 120 kV prospective scan was triggered in the descending thoracic aorta at 111 HU's. Axial non-contrast 3 mm slices were carried out through the heart. The data set was analyzed on a dedicated work station and scored using the [place]. Gantry rotation speed was 250 msecs and collimation was .6 mm. No beta  blockade and 0.8 mg of sl NTG was given. The 3D data set was reconstructed in 5% intervals of the 67-82 % of the R-R cycle. Diastolic phases were analyzed on a dedicated work station using MPR, MIP and VRT modes. The patient received 80 cc of contrast.  FINDINGS: Aortic Valve:  Trileaflet with no calcifications and no thickening.  Aorta: Dilated aortic root including sinotubular junction, normal size of the ascending aorta, aortic arch and descending aorta. No calcifications and no dissection.  Sinotubular Junction:  42 x 39 mm  Ascending Thoracic Aorta:  37 x 35 mm  Aortic Arch:  29 x 26 mm  Descending Thoracic Aorta:  27 x 27 mm  Sinus of Valsalva Measurements:  Non-coronary:  43 mm  Right -coronary:  47 mm  Left -coronary:  49 mm  Coronary Arteries:  Normal coronary origin.  Right dominance.  RCA is a large dominant artery that  gives rise to PDA and PLVB. There is minimal diffuse calcified plaque with associated stenosis 25-50%.  Left main is a large artery that gives rise to LAD and LCX arteries.  LAD is a large vessel that has gives rise to one small diagonal artery. There is mild diffuse calcified plaque in the proximal to mid LAD with maximum stenosis 25-50%.  LCX is a non-dominant artery that gives rise to one large OM1 branch. There is mild diffuse calcified plaque in the OM branch with maximum stenosis 25-50%.  Other findings:  Normal pulmonary vein drainage into the left atrium.  Normal let atrial appendage without a thrombus.  Normal size of the pulmonary artery.  IMPRESSION: 1. Dilated aortic root including sinotubular junction. Sinuses size 49 x 47 x 43 mm. The maximum diameter across sinuses measures 55 mm and is unchanged from the prior study on 06/22/2016.  2. Normal size of the ascending aorta, aortic arch and descending aorta. No calcifications and no dissection.  3. Coronary calcium score of 153. This was 6  percentile for age and sex matched control.  4. Normal coronary origin with right dominance. Mild non-obstructive plaque in the RCA, LAD and OM1. Aggressive risk factor modification is recommended.  5. Mildly dilated pulmonary artery measuring 34 mm suggestive of pulmonary hypertension.  6.  No ASD/VSD.  7.  No thrombus in the left atrial appendage.  Ena Dawley   Electronically Signed   By: Ena Dawley   On: 04/27/2017 15:20   EXAM: FF/RCT ANALYSIS  FINDINGS: FFRct analysis was performed on the original cardiac CT angiogram dataset. Diagrammatic representation of the FFRct analysis is provided in a separate PDF document in PACS. This dictation was created using the PDF document and an interactive 3D model of the results. 3D model is not available in the EMR/PACS. Normal FFR range is >0.80.  1. Left Main:  No significant stenosis.  2. LAD: Proximal FFR: 0.95. Mid LAD FFR: 0.88. Distal CT FFR: 0.84. 3. LCX:  Proximal LCX FFR:  0.94.  Distal LCX FFR:  0.84. 4. OM1 CT FFR: No significant stenosis. 5. RCA: No significant stenosis.  IMPRESSION: 1.  CT FFR analysis didn't show any significant stenosis.  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 The maximum orthogonal measurement across the sinuses is 49 mm across the left coronary sinus but the maximum diameter is 55 mm if measured across the widest part of the asymmetric sinuses. Echo has shown no AI. 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 I have reviewed all of his prior studies and I think this aneurysm is stable but it is at the diameter that surgical repair is recommended bases on the current study. This aneurysm is of the root phenotype which probably increases his risk of aortic dissection. His BP is well-controlled. I  reviewed the images with him and his wife  and answered their questions. I don't think he needs to have a cardiac cath since the cardiac CT did not show any significant disease in the coronary arteries. I discussed the surgical repair with valve sparing root replacement. I also discussed the possibility that the anatomy at the time of surgery may preclude a valve sparing procedure and he may require a [place] procedure.  I discussed alternatives, benefits and risks; including but not  limited to bleeding, blood transfusion, infection, stroke, myocardial infarction, graft failure, heart block requiring a permanent pacemaker, organ dysfunction, and death.  [name] understands and agrees to proceed.       [name], MD

## 2017-08-15 ENCOUNTER — Inpatient Hospital Stay (HOSPITAL_COMMUNITY): Payer: 59

## 2017-08-15 ENCOUNTER — Inpatient Hospital Stay (HOSPITAL_COMMUNITY): Payer: 59 | Admitting: Anesthesiology

## 2017-08-15 ENCOUNTER — Inpatient Hospital Stay (HOSPITAL_COMMUNITY)
Admission: RE | Admit: 2017-08-15 | Discharge: 2017-08-21 | DRG: 220 | Disposition: A | Discharge status: Home / Self Care | Payer: 59 | Source: Ambulatory Visit | Attending: Surgery | Admitting: Surgery

## 2017-08-15 ENCOUNTER — Inpatient Hospital Stay (HOSPITAL_COMMUNITY)
Admission: RE | Disposition: A | Discharge status: Home / Self Care | Payer: Self-pay | Source: Ambulatory Visit | Attending: Surgery

## 2017-08-15 DIAGNOSIS — Q2543 Congenital aneurysm of aorta: Principal | ICD-10-CM

## 2017-08-15 DIAGNOSIS — Z91040 Latex allergy status: Secondary | ICD-10-CM | POA: Diagnosis not present

## 2017-08-15 DIAGNOSIS — D62 Acute posthemorrhagic anemia: Secondary | ICD-10-CM | POA: Diagnosis not present

## 2017-08-15 DIAGNOSIS — J939 Pneumothorax, unspecified: Secondary | ICD-10-CM

## 2017-08-15 DIAGNOSIS — D696 Thrombocytopenia, unspecified: Secondary | ICD-10-CM | POA: Diagnosis not present

## 2017-08-15 DIAGNOSIS — E8770 Fluid overload, unspecified: Secondary | ICD-10-CM | POA: Diagnosis not present

## 2017-08-15 DIAGNOSIS — I10 Essential (primary) hypertension: Secondary | ICD-10-CM | POA: Diagnosis present

## 2017-08-15 DIAGNOSIS — Z8249 Family history of ischemic heart disease and other diseases of the circulatory system: Secondary | ICD-10-CM | POA: Diagnosis not present

## 2017-08-15 DIAGNOSIS — I712 Thoracic aortic aneurysm, without rupture: Secondary | ICD-10-CM | POA: Diagnosis not present

## 2017-08-15 DIAGNOSIS — I2510 Atherosclerotic heart disease of native coronary artery without angina pectoris: Secondary | ICD-10-CM | POA: Diagnosis present

## 2017-08-15 DIAGNOSIS — I498 Other specified cardiac arrhythmias: Secondary | ICD-10-CM | POA: Diagnosis not present

## 2017-08-15 DIAGNOSIS — Z880 Allergy status to penicillin: Secondary | ICD-10-CM

## 2017-08-15 DIAGNOSIS — E119 Type 2 diabetes mellitus without complications: Secondary | ICD-10-CM | POA: Diagnosis present

## 2017-08-15 DIAGNOSIS — Z8679 Personal history of other diseases of the circulatory system: Secondary | ICD-10-CM

## 2017-08-15 DIAGNOSIS — Z833 Family history of diabetes mellitus: Secondary | ICD-10-CM

## 2017-08-15 DIAGNOSIS — K219 Gastro-esophageal reflux disease without esophagitis: Secondary | ICD-10-CM | POA: Diagnosis present

## 2017-08-15 DIAGNOSIS — Z9889 Other specified postprocedural states: Secondary | ICD-10-CM

## 2017-08-15 DIAGNOSIS — Z882 Allergy status to sulfonamides status: Secondary | ICD-10-CM | POA: Diagnosis not present

## 2017-08-15 DIAGNOSIS — I361 Nonrheumatic tricuspid (valve) insufficiency: Secondary | ICD-10-CM | POA: Diagnosis not present

## 2017-08-15 DIAGNOSIS — I77810 Thoracic aortic ectasia: Secondary | ICD-10-CM | POA: Diagnosis present

## 2017-08-15 DIAGNOSIS — I711 Thoracic aortic aneurysm, ruptured: Secondary | ICD-10-CM | POA: Diagnosis not present

## 2017-08-15 DIAGNOSIS — I714 Abdominal aortic aneurysm, without rupture: Secondary | ICD-10-CM | POA: Diagnosis not present

## 2017-08-15 DIAGNOSIS — I719 Aortic aneurysm of unspecified site, without rupture: Secondary | ICD-10-CM | POA: Diagnosis not present

## 2017-08-15 DIAGNOSIS — J9811 Atelectasis: Secondary | ICD-10-CM | POA: Diagnosis not present

## 2017-08-15 HISTORY — PX: TEE WITHOUT CARDIOVERSION: SHX5443

## 2017-08-15 HISTORY — PX: ASCENDING AORTIC ROOT REPLACEMENT: SHX5729

## 2017-08-15 LAB — POCT I-STAT 3, ART BLOOD GAS (G3+)
ACID-BASE DEFICIT: 5 mmol/L — AB (ref 0.0–2.0)
ACID-BASE EXCESS: 2 mmol/L (ref 0.0–2.0)
Acid-base deficit: 3 mmol/L — ABNORMAL HIGH (ref 0.0–2.0)
Acid-base deficit: 6 mmol/L — ABNORMAL HIGH (ref 0.0–2.0)
BICARBONATE: 27.2 mmol/L (ref 20.0–28.0)
BICARBONATE: 21.1 mmol/L (ref 20.0–28.0)
Bicarbonate: 25 mmol/L (ref 20.0–28.0)
Bicarbonate: 21.2 mmol/L (ref 20.0–28.0)
Bicarbonate: 23 mmol/L (ref 20.0–28.0)
O2 SAT: 99 %
O2 SAT: 98 %
O2 SAT: 99 %
O2 Saturation: 100 %
O2 Saturation: 88 %
PH ART: 7.42 (ref 7.350–7.450)
PH ART: 7.334 — AB (ref 7.350–7.450)
PO2 ART: 452 mmHg — AB (ref 83.0–108.0)
PO2 ART: 140 mmHg — AB (ref 83.0–108.0)
PO2 ART: 61 mmHg — AB (ref 83.0–108.0)
Patient temperature: 34.9
TCO2: 28 mmol/L (ref 22–32)
TCO2: 26 mmol/L (ref 22–32)
TCO2: 22 mmol/L (ref 22–32)
TCO2: 24 mmol/L (ref 22–32)
TCO2: 22 mmol/L (ref 22–32)
pCO2 arterial: 42 mmHg (ref 32.0–48.0)
pCO2 arterial: 41.8 mmHg (ref 32.0–48.0)
pCO2 arterial: 37.8 mmHg (ref 32.0–48.0)
pCO2 arterial: 42.8 mmHg (ref 32.0–48.0)
pCO2 arterial: 44.6 mmHg (ref 32.0–48.0)
pH, Arterial: 7.385 (ref 7.350–7.450)
pH, Arterial: 7.347 — ABNORMAL LOW (ref 7.350–7.450)
pH, Arterial: 7.281 — ABNORMAL LOW (ref 7.350–7.450)
pO2, Arterial: 98 mmHg (ref 83.0–108.0)
pO2, Arterial: 122 mmHg — ABNORMAL HIGH (ref 83.0–108.0)

## 2017-08-15 LAB — POCT I-STAT, CHEM 8
BUN: 20 mg/dL (ref 6–20)
BUN: 17 mg/dL (ref 6–20)
BUN: 19 mg/dL (ref 6–20)
BUN: 19 mg/dL (ref 6–20)
BUN: 19 mg/dL (ref 6–20)
BUN: 19 mg/dL (ref 6–20)
BUN: 18 mg/dL (ref 6–20)
BUN: 18 mg/dL (ref 6–20)
CALCIUM ION: 1.12 mmol/L — AB (ref 1.15–1.40)
CALCIUM ION: 1.1 mmol/L — AB (ref 1.15–1.40)
CALCIUM ION: 1.11 mmol/L — AB (ref 1.15–1.40)
CALCIUM ION: 1.17 mmol/L (ref 1.15–1.40)
CHLORIDE: 104 mmol/L (ref 101–111)
CHLORIDE: 100 mmol/L — AB (ref 101–111)
CHLORIDE: 106 mmol/L (ref 101–111)
CREATININE: 0.5 mg/dL — AB (ref 0.61–1.24)
CREATININE: 0.7 mg/dL (ref 0.61–1.24)
CREATININE: 0.7 mg/dL (ref 0.61–1.24)
CREATININE: 0.7 mg/dL (ref 0.61–1.24)
CREATININE: 0.7 mg/dL (ref 0.61–1.24)
CREATININE: 0.7 mg/dL (ref 0.61–1.24)
Calcium, Ion: 1.24 mmol/L (ref 1.15–1.40)
Calcium, Ion: 1.02 mmol/L — ABNORMAL LOW (ref 1.15–1.40)
Calcium, Ion: 1.21 mmol/L (ref 1.15–1.40)
Calcium, Ion: 1.14 mmol/L — ABNORMAL LOW (ref 1.15–1.40)
Chloride: 103 mmol/L (ref 101–111)
Chloride: 101 mmol/L (ref 101–111)
Chloride: 101 mmol/L (ref 101–111)
Chloride: 103 mmol/L (ref 101–111)
Chloride: 103 mmol/L (ref 101–111)
Creatinine, Ser: 0.7 mg/dL (ref 0.61–1.24)
Creatinine, Ser: 0.8 mg/dL (ref 0.61–1.24)
GLUCOSE: 100 mg/dL — AB (ref 65–99)
GLUCOSE: 116 mg/dL — AB (ref 65–99)
GLUCOSE: 138 mg/dL — AB (ref 65–99)
GLUCOSE: 154 mg/dL — AB (ref 65–99)
GLUCOSE: 146 mg/dL — AB (ref 65–99)
Glucose, Bld: 104 mg/dL — ABNORMAL HIGH (ref 65–99)
Glucose, Bld: 137 mg/dL — ABNORMAL HIGH (ref 65–99)
Glucose, Bld: 134 mg/dL — ABNORMAL HIGH (ref 65–99)
HCT: 28 % — ABNORMAL LOW (ref 39.0–52.0)
HCT: 25 % — ABNORMAL LOW (ref 39.0–52.0)
HCT: 26 % — ABNORMAL LOW (ref 39.0–52.0)
HCT: 35 % — ABNORMAL LOW (ref 39.0–52.0)
HEMATOCRIT: 36 % — AB (ref 39.0–52.0)
HEMATOCRIT: 31 % — AB (ref 39.0–52.0)
HEMATOCRIT: 29 % — AB (ref 39.0–52.0)
HEMATOCRIT: 28 % — AB (ref 39.0–52.0)
HEMOGLOBIN: 10.5 g/dL — AB (ref 13.0–17.0)
HEMOGLOBIN: 9.5 g/dL — AB (ref 13.0–17.0)
HEMOGLOBIN: 9.9 g/dL — AB (ref 13.0–17.0)
HEMOGLOBIN: 9.5 g/dL — AB (ref 13.0–17.0)
Hemoglobin: 12.2 g/dL — ABNORMAL LOW (ref 13.0–17.0)
Hemoglobin: 8.5 g/dL — ABNORMAL LOW (ref 13.0–17.0)
Hemoglobin: 8.8 g/dL — ABNORMAL LOW (ref 13.0–17.0)
Hemoglobin: 11.9 g/dL — ABNORMAL LOW (ref 13.0–17.0)
POTASSIUM: 3.6 mmol/L (ref 3.5–5.1)
POTASSIUM: 3.9 mmol/L (ref 3.5–5.1)
POTASSIUM: 4.1 mmol/L (ref 3.5–5.1)
Potassium: 4.5 mmol/L (ref 3.5–5.1)
Potassium: 4.8 mmol/L (ref 3.5–5.1)
Potassium: 5.5 mmol/L — ABNORMAL HIGH (ref 3.5–5.1)
Potassium: 4.2 mmol/L (ref 3.5–5.1)
Potassium: 4.6 mmol/L (ref 3.5–5.1)
SODIUM: 142 mmol/L (ref 135–145)
SODIUM: 142 mmol/L (ref 135–145)
Sodium: 140 mmol/L (ref 135–145)
Sodium: 142 mmol/L (ref 135–145)
Sodium: 138 mmol/L (ref 135–145)
Sodium: 136 mmol/L (ref 135–145)
Sodium: 137 mmol/L (ref 135–145)
Sodium: 138 mmol/L (ref 135–145)
TCO2: 25 mmol/L (ref 22–32)
TCO2: 27 mmol/L (ref 22–32)
TCO2: 27 mmol/L (ref 22–32)
TCO2: 28 mmol/L (ref 22–32)
TCO2: 26 mmol/L (ref 22–32)
TCO2: 26 mmol/L (ref 22–32)
TCO2: 25 mmol/L (ref 22–32)
TCO2: 24 mmol/L (ref 22–32)

## 2017-08-15 LAB — PROTIME-INR
INR: 1.36
PROTHROMBIN TIME: 16.7 s — AB (ref 11.4–15.2)

## 2017-08-15 LAB — MAGNESIUM: MAGNESIUM: 3 mg/dL — AB (ref 1.7–2.4)

## 2017-08-15 LAB — GLUCOSE, CAPILLARY
GLUCOSE-CAPILLARY: 95 mg/dL (ref 65–99)
Glucose-Capillary: 155 mg/dL — ABNORMAL HIGH (ref 65–99)
Glucose-Capillary: 135 mg/dL — ABNORMAL HIGH (ref 65–99)
Glucose-Capillary: 135 mg/dL — ABNORMAL HIGH (ref 65–99)
Glucose-Capillary: 119 mg/dL — ABNORMAL HIGH (ref 65–99)
Glucose-Capillary: 132 mg/dL — ABNORMAL HIGH (ref 65–99)
Glucose-Capillary: 145 mg/dL — ABNORMAL HIGH (ref 65–99)

## 2017-08-15 LAB — POCT I-STAT 4, (NA,K, GLUC, HGB,HCT)
Glucose, Bld: 80 mg/dL (ref 65–99)
HEMATOCRIT: 28 % — AB (ref 39.0–52.0)
Hemoglobin: 9.5 g/dL — ABNORMAL LOW (ref 13.0–17.0)
Potassium: 3.3 mmol/L — ABNORMAL LOW (ref 3.5–5.1)
Sodium: 146 mmol/L — ABNORMAL HIGH (ref 135–145)

## 2017-08-15 LAB — CBC
HCT: 34.6 % — ABNORMAL LOW (ref 39.0–52.0)
HEMATOCRIT: 36.5 % — AB (ref 39.0–52.0)
HEMOGLOBIN: 11.9 g/dL — AB (ref 13.0–17.0)
Hemoglobin: 11.3 g/dL — ABNORMAL LOW (ref 13.0–17.0)
MCH: 26.7 pg (ref 26.0–34.0)
MCH: 26.5 pg (ref 26.0–34.0)
MCHC: 32.7 g/dL (ref 30.0–36.0)
MCHC: 32.6 g/dL (ref 30.0–36.0)
MCV: 81.6 fL (ref 78.0–100.0)
MCV: 81.3 fL (ref 78.0–100.0)
PLATELETS: 114 10*3/uL — AB (ref 150–400)
Platelets: 148 10*3/uL — ABNORMAL LOW (ref 150–400)
RBC: 4.24 MIL/uL (ref 4.22–5.81)
RBC: 4.49 MIL/uL (ref 4.22–5.81)
RDW: 13.7 % (ref 11.5–15.5)
RDW: 13.5 % (ref 11.5–15.5)
WBC: 9.1 10*3/uL (ref 4.0–10.5)
WBC: 11.8 10*3/uL — ABNORMAL HIGH (ref 4.0–10.5)

## 2017-08-15 LAB — CREATININE, SERUM
Creatinine, Ser: 0.82 mg/dL (ref 0.61–1.24)
GFR calc non Af Amer: >60 mL/min (ref >60)

## 2017-08-15 LAB — HEMOGLOBIN AND HEMATOCRIT, BLOOD
HEMATOCRIT: 29.5 % — AB (ref 39.0–52.0)
Hemoglobin: 9.7 g/dL — ABNORMAL LOW (ref 13.0–17.0)

## 2017-08-15 LAB — APTT: aPTT: 33 seconds (ref 24–36)

## 2017-08-15 LAB — PLATELET COUNT: Platelets: 126 10*3/uL — ABNORMAL LOW (ref 150–400)

## 2017-08-15 SURGERY — ASCENDING AORTIC ROOT REPLACEMENT
Anesthesia: General | Site: Chest

## 2017-08-15 MED ORDER — LORATADINE 10 MG PO TABS
10.0000 mg | ORAL_TABLET | Freq: Every day | ORAL | Status: DC
  Administered 2017-08-16 – 2017-08-21 (×6): 10 mg via ORAL
  Filled 2017-08-15 (×6): qty 1

## 2017-08-15 MED ORDER — HEMOSTATIC AGENTS (NO CHARGE) OPTIME
TOPICAL | Status: DC | PRN
  Administered 2017-08-15: 1 via TOPICAL

## 2017-08-15 MED ORDER — LACTATED RINGERS IV SOLN
INTRAVENOUS | Status: DC
  Administered 2017-08-15: 22:00:00 via INTRAVENOUS

## 2017-08-15 MED ORDER — FENTANYL CITRATE (PF) 250 MCG/5ML IJ SOLN
INTRAMUSCULAR | Status: AC
Start: 2017-08-15 — End: ?
  Filled 2017-08-15: qty 20

## 2017-08-15 MED ORDER — PROPOFOL 10 MG/ML IV BOLUS
INTRAVENOUS | Status: AC
Start: 2017-08-15 — End: ?
  Filled 2017-08-15: qty 20

## 2017-08-15 MED ORDER — METOCLOPRAMIDE HCL 5 MG/ML IJ SOLN
10.0000 mg | Freq: Four times a day (QID) | INTRAMUSCULAR | Status: AC
  Administered 2017-08-15 – 2017-08-16 (×3): 10 mg via INTRAVENOUS
  Filled 2017-08-15 (×2): qty 2

## 2017-08-15 MED ORDER — CHLORHEXIDINE GLUCONATE 0.12 % MT SOLN: 15.0000 mL | Freq: Once | OROMUCOSAL | Status: DC

## 2017-08-15 MED ORDER — FAMOTIDINE IN NACL 20-0.9 MG/50ML-% IV SOLN
20.0000 mg | Freq: Two times a day (BID) | INTRAVENOUS | Status: AC
  Administered 2017-08-15: 20 mg via INTRAVENOUS

## 2017-08-15 MED ORDER — PROPOFOL 10 MG/ML IV BOLUS
INTRAVENOUS | Status: DC | PRN
  Administered 2017-08-15: 50 mg via INTRAVENOUS
  Administered 2017-08-15: 40 mg via INTRAVENOUS
  Administered 2017-08-15: 50 mg via INTRAVENOUS

## 2017-08-15 MED ORDER — DIPHENHYDRAMINE HCL 50 MG/ML IJ SOLN
INTRAMUSCULAR | Status: DC | PRN
  Administered 2017-08-15: 50 mg via INTRAVENOUS

## 2017-08-15 MED ORDER — CHLORHEXIDINE GLUCONATE 4 % EX LIQD: 30.0000 mL | CUTANEOUS | Status: DC

## 2017-08-15 MED ORDER — PROTAMINE SULFATE 10 MG/ML IV SOLN
INTRAVENOUS | Status: AC
  Filled 2017-08-15: qty 5

## 2017-08-15 MED ORDER — PANTOPRAZOLE SODIUM 40 MG PO TBEC
40.0000 mg | DELAYED_RELEASE_TABLET | Freq: Every day | ORAL | Status: DC
  Administered 2017-08-17: 40 mg via ORAL
  Filled 2017-08-15: qty 1

## 2017-08-15 MED ORDER — MAGNESIUM SULFATE 4 GM/100ML IV SOLN
4.0000 g | Freq: Once | INTRAVENOUS | Status: AC
  Administered 2017-08-15: 4 g via INTRAVENOUS
  Filled 2017-08-15: qty 100

## 2017-08-15 MED ORDER — SODIUM CHLORIDE 0.9 % IV SOLN: 250.0000 mL | INTRAVENOUS | Status: DC

## 2017-08-15 MED ORDER — DOCUSATE SODIUM 100 MG PO CAPS
200.0000 mg | ORAL_CAPSULE | Freq: Every day | ORAL | Status: DC
  Administered 2017-08-16 – 2017-08-17 (×2): 200 mg via ORAL
  Filled 2017-08-15 (×2): qty 2

## 2017-08-15 MED ORDER — MIDAZOLAM HCL 10 MG/2ML IJ SOLN
INTRAMUSCULAR | Status: AC
  Filled 2017-08-15: qty 2

## 2017-08-15 MED ORDER — SODIUM CHLORIDE 0.9 % IV SOLN
INTRAVENOUS | Status: DC
  Administered 2017-08-15: 0.4 [IU]/h via INTRAVENOUS
  Filled 2017-08-15: qty 1

## 2017-08-15 MED ORDER — OXYCODONE HCL 5 MG PO TABS: 5.0000 mg | ORAL_TABLET | ORAL | Status: DC | PRN

## 2017-08-15 MED ORDER — BISACODYL 5 MG PO TBEC
10.0000 mg | DELAYED_RELEASE_TABLET | Freq: Every day | ORAL | Status: DC
  Administered 2017-08-16: 10 mg via ORAL
  Filled 2017-08-15: qty 2

## 2017-08-15 MED ORDER — POTASSIUM CHLORIDE 10 MEQ/50ML IV SOLN
10.0000 meq | INTRAVENOUS | Status: AC
  Administered 2017-08-15 (×3): 10 meq via INTRAVENOUS

## 2017-08-15 MED ORDER — DIPHENHYDRAMINE HCL 50 MG/ML IJ SOLN: INTRAMUSCULAR | Status: DC | PRN

## 2017-08-15 MED ORDER — SODIUM CHLORIDE 0.45 % IV SOLN: INTRAVENOUS | Status: DC | PRN

## 2017-08-15 MED ORDER — LACTATED RINGERS IV SOLN
INTRAVENOUS | Status: DC
  Administered 2017-08-15: 21:00:00 via INTRAVENOUS

## 2017-08-15 MED ORDER — FENTANYL CITRATE (PF) 250 MCG/5ML IJ SOLN
INTRAMUSCULAR | Status: DC | PRN
  Administered 2017-08-15: 1000 ug via INTRAVENOUS
  Administered 2017-08-15: 150 ug via INTRAVENOUS
  Administered 2017-08-15: 100 ug via INTRAVENOUS
  Administered 2017-08-15: 250 ug via INTRAVENOUS

## 2017-08-15 MED ORDER — MIDAZOLAM HCL 2 MG/2ML IJ SOLN: 2.0000 mg | INTRAMUSCULAR | Status: DC | PRN

## 2017-08-15 MED ORDER — DEXMEDETOMIDINE HCL IN NACL 400 MCG/100ML IV SOLN
0.1000 ug/kg/h | INTRAVENOUS | Status: DC
  Filled 2017-08-15: qty 100

## 2017-08-15 MED ORDER — METOPROLOL TARTRATE 25 MG/10 ML ORAL SUSPENSION: 12.5000 mg | Freq: Two times a day (BID) | ORAL | Status: DC

## 2017-08-15 MED ORDER — ACETAMINOPHEN 160 MG/5ML PO SOLN: 650.0000 mg | Freq: Once | ORAL | Status: AC

## 2017-08-15 MED ORDER — FAMOTIDINE 20 MG PO TABS
40.0000 mg | ORAL_TABLET | Freq: Every day | ORAL | Status: DC
  Administered 2017-08-16 – 2017-08-21 (×6): 40 mg via ORAL
  Filled 2017-08-15: qty 2
  Filled 2017-08-15: qty 1
  Filled 2017-08-15 (×4): qty 2
  Filled 2017-08-15: qty 1
  Filled 2017-08-15: qty 2

## 2017-08-15 MED ORDER — ONDANSETRON HCL 4 MG/2ML IJ SOLN: 4.0000 mg | Freq: Four times a day (QID) | INTRAMUSCULAR | Status: DC | PRN

## 2017-08-15 MED ORDER — ROCURONIUM BROMIDE 100 MG/10ML IV SOLN
INTRAVENOUS | Status: DC | PRN
  Administered 2017-08-15 (×2): 50 mg via INTRAVENOUS
  Administered 2017-08-15: 100 mg via INTRAVENOUS

## 2017-08-15 MED ORDER — THROMBIN (RECOMBINANT) 5000 UNITS EX SOLR
OROMUCOSAL | Status: DC | PRN
  Administered 2017-08-15 (×3): 5 mL via TOPICAL

## 2017-08-15 MED ORDER — PROTAMINE SULFATE 10 MG/ML IV SOLN
INTRAVENOUS | Status: AC
  Filled 2017-08-15: qty 25

## 2017-08-15 MED ORDER — HEPARIN SODIUM (PORCINE) 1000 UNIT/ML IJ SOLN
INTRAMUSCULAR | Status: AC
  Filled 2017-08-15: qty 1

## 2017-08-15 MED ORDER — BISACODYL 10 MG RE SUPP: 10.0000 mg | Freq: Every day | RECTAL | Status: DC

## 2017-08-15 MED ORDER — LIDOCAINE 2% (20 MG/ML) 5 ML SYRINGE
INTRAMUSCULAR | Status: AC
  Filled 2017-08-15: qty 5

## 2017-08-15 MED ORDER — INSULIN REGULAR BOLUS VIA INFUSION
0.0000 [IU] | Freq: Three times a day (TID) | INTRAVENOUS | Status: DC
  Filled 2017-08-15: qty 10

## 2017-08-15 MED ORDER — ORAL CARE MOUTH RINSE
15.0000 mL | Freq: Two times a day (BID) | OROMUCOSAL | Status: DC
  Administered 2017-08-16 – 2017-08-20 (×8): 15 mL via OROMUCOSAL

## 2017-08-15 MED ORDER — ACETAMINOPHEN 160 MG/5ML PO SOLN: 1000.0000 mg | Freq: Four times a day (QID) | ORAL | Status: DC

## 2017-08-15 MED ORDER — SODIUM CHLORIDE 0.9% FLUSH: 3.0000 mL | INTRAVENOUS | Status: DC | PRN

## 2017-08-15 MED ORDER — SODIUM CHLORIDE 0.9% FLUSH
3.0000 mL | Freq: Two times a day (BID) | INTRAVENOUS | Status: DC
  Administered 2017-08-16 – 2017-08-17 (×3): 3 mL via INTRAVENOUS

## 2017-08-15 MED ORDER — DEXTROSE 5 % IV SOLN
INTRAVENOUS | Status: DC | PRN
  Administered 2017-08-15: 750 mg via INTRAVENOUS

## 2017-08-15 MED ORDER — FENTANYL CITRATE (PF) 250 MCG/5ML IJ SOLN
INTRAMUSCULAR | Status: AC
  Filled 2017-08-15: qty 5

## 2017-08-15 MED ORDER — HEPARIN SODIUM (PORCINE) 1000 UNIT/ML IJ SOLN
INTRAMUSCULAR | Status: DC | PRN
  Administered 2017-08-15: 33000 [IU] via INTRAVENOUS

## 2017-08-15 MED ORDER — ALBUMIN HUMAN 5 % IV SOLN
250.0000 mL | INTRAVENOUS | Status: AC | PRN
  Administered 2017-08-15 (×2): 250 mL via INTRAVENOUS

## 2017-08-15 MED ORDER — SODIUM CHLORIDE 0.9 % IV SOLN
0.0000 ug/min | INTRAVENOUS | Status: DC
  Filled 2017-08-15: qty 2

## 2017-08-15 MED ORDER — SODIUM CHLORIDE 0.9 % IV SOLN: INTRAVENOUS | Status: DC

## 2017-08-15 MED ORDER — METOPROLOL TARTRATE 5 MG/5ML IV SOLN: 2.5000 mg | INTRAVENOUS | Status: DC | PRN

## 2017-08-15 MED ORDER — CHLORHEXIDINE GLUCONATE 0.12 % MT SOLN
15.0000 mL | OROMUCOSAL | Status: AC
  Administered 2017-08-15: 15 mL via OROMUCOSAL
  Filled 2017-08-15: qty 15

## 2017-08-15 MED ORDER — 0.9 % SODIUM CHLORIDE (POUR BTL) OPTIME
TOPICAL | Status: DC | PRN
  Administered 2017-08-15: 4000 mL

## 2017-08-15 MED ORDER — CHLORHEXIDINE GLUCONATE 0.12 % MT SOLN
OROMUCOSAL | Status: AC
  Filled 2017-08-15: qty 15

## 2017-08-15 MED ORDER — DEXMEDETOMIDINE HCL IN NACL 200 MCG/50ML IV SOLN
INTRAVENOUS | Status: AC
  Filled 2017-08-15: qty 100

## 2017-08-15 MED ORDER — LACTATED RINGERS IV SOLN
INTRAVENOUS | Status: DC | PRN
  Administered 2017-08-15 (×4): via INTRAVENOUS

## 2017-08-15 MED ORDER — PHENYLEPHRINE HCL 10 MG/ML IJ SOLN
INTRAVENOUS | Status: DC | PRN
  Administered 2017-08-15: 20 ug/min via INTRAVENOUS

## 2017-08-15 MED ORDER — CHLORHEXIDINE GLUCONATE 0.12% ORAL RINSE (MEDLINE KIT)
15.0000 mL | Freq: Two times a day (BID) | OROMUCOSAL | Status: DC
  Administered 2017-08-15: 15 mL via OROMUCOSAL

## 2017-08-15 MED ORDER — ORAL CARE MOUTH RINSE
15.0000 mL | Freq: Four times a day (QID) | OROMUCOSAL | Status: DC
  Administered 2017-08-15: 15 mL via OROMUCOSAL

## 2017-08-15 MED ORDER — MIDAZOLAM HCL 5 MG/ML IJ SOLN
INTRAMUSCULAR | Status: DC | PRN
  Administered 2017-08-15: 5 mg via INTRAVENOUS
  Administered 2017-08-15: 2 mg via INTRAVENOUS
  Administered 2017-08-15: 3 mg via INTRAVENOUS

## 2017-08-15 MED ORDER — ACETAMINOPHEN 500 MG PO TABS
1000.0000 mg | ORAL_TABLET | Freq: Four times a day (QID) | ORAL | Status: DC
  Administered 2017-08-15 – 2017-08-17 (×6): 1000 mg via ORAL
  Filled 2017-08-15 (×6): qty 2

## 2017-08-15 MED ORDER — ATORVASTATIN CALCIUM 20 MG PO TABS
20.0000 mg | ORAL_TABLET | Freq: Every day | ORAL | Status: DC
Start: 2017-08-16 — End: 2017-08-21
  Administered 2017-08-16 – 2017-08-21 (×6): 20 mg via ORAL
  Filled 2017-08-15 (×6): qty 1

## 2017-08-15 MED ORDER — SODIUM CHLORIDE 0.9 % IV SOLN
0.0000 ug/kg/h | INTRAVENOUS | Status: DC
  Filled 2017-08-15: qty 2

## 2017-08-15 MED ORDER — NITROGLYCERIN IN D5W 200-5 MCG/ML-% IV SOLN: 0.0000 ug/min | INTRAVENOUS | Status: DC

## 2017-08-15 MED ORDER — METOPROLOL TARTRATE 12.5 MG HALF TABLET
12.5000 mg | ORAL_TABLET | Freq: Two times a day (BID) | ORAL | Status: DC
  Administered 2017-08-16 (×2): 12.5 mg via ORAL
  Filled 2017-08-15 (×2): qty 1

## 2017-08-15 MED ORDER — ARTIFICIAL TEARS OPHTHALMIC OINT
TOPICAL_OINTMENT | OPHTHALMIC | Status: AC
  Filled 2017-08-15: qty 3.5

## 2017-08-15 MED ORDER — LACTATED RINGERS IV SOLN
500.0000 mL | Freq: Once | INTRAVENOUS | Status: AC | PRN
  Administered 2017-08-15: 500 mL via INTRAVENOUS

## 2017-08-15 MED ORDER — THROMBIN (RECOMBINANT) 5000 UNITS EX SOLR
CUTANEOUS | Status: AC
  Filled 2017-08-15: qty 15000

## 2017-08-15 MED ORDER — ACETAMINOPHEN 650 MG RE SUPP
650.0000 mg | Freq: Once | RECTAL | Status: AC
  Administered 2017-08-15: 650 mg via RECTAL

## 2017-08-15 MED ORDER — ROCURONIUM BROMIDE 10 MG/ML (PF) SYRINGE
PREFILLED_SYRINGE | INTRAVENOUS | Status: AC
  Filled 2017-08-15: qty 10

## 2017-08-15 MED ORDER — MORPHINE SULFATE (PF) 4 MG/ML IV SOLN
2.0000 mg | INTRAVENOUS | Status: DC | PRN
  Administered 2017-08-16: 2 mg via INTRAVENOUS
  Filled 2017-08-15: qty 1

## 2017-08-15 MED ORDER — VANCOMYCIN HCL IN DEXTROSE 1-5 GM/200ML-% IV SOLN
1000.0000 mg | Freq: Once | INTRAVENOUS | Status: AC
  Administered 2017-08-15: 1000 mg via INTRAVENOUS
  Filled 2017-08-15: qty 200

## 2017-08-15 MED ORDER — PROTAMINE SULFATE 10 MG/ML IV SOLN
INTRAVENOUS | Status: DC | PRN
  Administered 2017-08-15 (×3): 50 mg via INTRAVENOUS
  Administered 2017-08-15: 10 mg via INTRAVENOUS
  Administered 2017-08-15: 70 mg via INTRAVENOUS
  Administered 2017-08-15 (×2): 50 mg via INTRAVENOUS

## 2017-08-15 MED ORDER — ASPIRIN 81 MG PO CHEW
324.0000 mg | CHEWABLE_TABLET | Freq: Every day | ORAL | Status: DC
  Filled 2017-08-15: qty 4

## 2017-08-15 MED ORDER — HYDROCORTISONE NA SUCCINATE PF 100 MG IJ SOLR
INTRAMUSCULAR | Status: DC | PRN
  Administered 2017-08-15: 100 mg via INTRAVENOUS

## 2017-08-15 MED ORDER — ASPIRIN EC 325 MG PO TBEC
325.0000 mg | DELAYED_RELEASE_TABLET | Freq: Every day | ORAL | Status: DC
  Administered 2017-08-16 – 2017-08-17 (×2): 325 mg via ORAL
  Filled 2017-08-15 (×2): qty 1

## 2017-08-15 MED ORDER — TRAMADOL HCL 50 MG PO TABS
50.0000 mg | ORAL_TABLET | ORAL | Status: DC | PRN
  Administered 2017-08-16 (×2): 50 mg via ORAL
  Administered 2017-08-16: 100 mg via ORAL
  Administered 2017-08-17: 50 mg via ORAL
  Filled 2017-08-15: qty 1
  Filled 2017-08-15: qty 2
  Filled 2017-08-15 (×2): qty 1

## 2017-08-15 MED ORDER — LEVOFLOXACIN IN D5W 750 MG/150ML IV SOLN
750.0000 mg | INTRAVENOUS | Status: AC
  Administered 2017-08-16: 750 mg via INTRAVENOUS
  Filled 2017-08-15: qty 150

## 2017-08-15 MED ORDER — MORPHINE SULFATE (PF) 4 MG/ML IV SOLN: 1.0000 mg | INTRAVENOUS | Status: AC | PRN

## 2017-08-15 SURGICAL SUPPLY — 96 items
ADAPTER CARDIO PERF ANTE/RETRO (ADAPTER) ×3 IMPLANT
APPLICATOR TIP COSEAL (VASCULAR PRODUCTS) ×3 IMPLANT
ATTRACTOMAT 16X20 MAGNETIC DRP (DRAPES) ×3 IMPLANT
BAG DECANTER FOR FLEXI CONT (MISCELLANEOUS) ×3 IMPLANT
BLADE STERNUM SYSTEM 6 (BLADE) ×3 IMPLANT
BLADE SURG 15 STRL LF DISP TIS (BLADE) ×2 IMPLANT
BLADE SURG 15 STRL SS (BLADE) ×1
CANISTER SUCT 3000ML PPV (MISCELLANEOUS) ×3 IMPLANT
CANNULA GUNDRY RCSP 15FR (MISCELLANEOUS) ×3 IMPLANT
CANNULA SUMP PERICARDIAL (CANNULA) ×3 IMPLANT
CATH ROBINSON RED A/P 18FR (CATHETERS) IMPLANT
CATH THORACIC 36FR (CATHETERS) ×3 IMPLANT
CATH THORACIC 36FR RT ANG (CATHETERS) ×3 IMPLANT
CAUTERY HIGH TEMP VAS (MISCELLANEOUS) ×3 IMPLANT
CAUTERY SURG HI TEMP FINE TIP (MISCELLANEOUS) ×3 IMPLANT
CONT SPEC 4OZ CLIKSEAL STRL BL (MISCELLANEOUS) ×3 IMPLANT
COVER SURGICAL LIGHT HANDLE (MISCELLANEOUS) ×6 IMPLANT
CRADLE DONUT ADULT HEAD (MISCELLANEOUS) ×3 IMPLANT
DRAPE SLUSH/WARMER DISC (DRAPES) IMPLANT
DRSG COVADERM 4X14 (GAUZE/BANDAGES/DRESSINGS) ×3 IMPLANT
ELECT CAUTERY BLADE 6.4 (BLADE) ×3 IMPLANT
ELECT REM PT RETURN 9FT ADLT (ELECTROSURGICAL) ×6
ELECTRODE REM PT RTRN 9FT ADLT (ELECTROSURGICAL) ×4 IMPLANT
GAUZE SPONGE 4X4 12PLY STRL (GAUZE/BANDAGES/DRESSINGS) ×3 IMPLANT
GAUZE SPONGE 4X4 12PLY STRL LF (GAUZE/BANDAGES/DRESSINGS) ×3 IMPLANT
GLOVE BIO SURGEON STRL SZ 6 (GLOVE) IMPLANT
GLOVE BIO SURGEON STRL SZ 6.5 (GLOVE) IMPLANT
GLOVE BIO SURGEON STRL SZ7 (GLOVE) IMPLANT
GLOVE BIO SURGEON STRL SZ7.5 (GLOVE) IMPLANT
GLOVE BIOGEL PI IND STRL 6.5 (GLOVE) ×10 IMPLANT
GLOVE BIOGEL PI INDICATOR 6.5 (GLOVE) ×5
GLOVE EUDERMIC 7 POWDERFREE (GLOVE) ×6 IMPLANT
GLOVE SS N UNI LF 6.5 STRL (GLOVE) ×15 IMPLANT
GLOVE SS N UNI LF 7.0 STRL (GLOVE) ×6 IMPLANT
GLOVE SURG SS PI 6.5 STRL IVOR (GLOVE) ×15 IMPLANT
GOWN STRL REUS W/ TWL LRG LVL3 (GOWN DISPOSABLE) ×12 IMPLANT
GOWN STRL REUS W/ TWL XL LVL3 (GOWN DISPOSABLE) ×2 IMPLANT
GOWN STRL REUS W/TWL LRG LVL3 (GOWN DISPOSABLE) ×6
GOWN STRL REUS W/TWL XL LVL3 (GOWN DISPOSABLE) ×1
GRAFT GELWEAVE VALSALVA 32CM (Prosthesis & Implant Heart) ×3 IMPLANT
HEART VENT LT CURVED (MISCELLANEOUS) ×3 IMPLANT
HEMOSTAT POWDER SURGIFOAM 1G (HEMOSTASIS) ×9 IMPLANT
HEMOSTAT SURGICEL 2X14 (HEMOSTASIS) ×3 IMPLANT
KIT BASIN OR (CUSTOM PROCEDURE TRAY) ×3 IMPLANT
KIT CATH CPB BARTLE (MISCELLANEOUS) ×3 IMPLANT
KIT ROOM TURNOVER OR (KITS) ×3 IMPLANT
KIT SUCTION CATH 14FR (SUCTIONS) ×9 IMPLANT
LINE VENT (MISCELLANEOUS) ×3 IMPLANT
NS IRRIG 1000ML POUR BTL (IV SOLUTION) ×15 IMPLANT
PACK E OPEN HEART (SUTURE) ×3 IMPLANT
PACK OPEN HEART (CUSTOM PROCEDURE TRAY) ×3 IMPLANT
PAD ARMBOARD 7.5X6 YLW CONV (MISCELLANEOUS) ×6 IMPLANT
PAD CARDIAC INSULATION (MISCELLANEOUS) ×3 IMPLANT
SEALANT SURG COSEAL 8ML (VASCULAR PRODUCTS) ×3 IMPLANT
SET CARDIOPLEGIA MPS 5001102 (MISCELLANEOUS) ×3 IMPLANT
SUT BONE WAX W31G (SUTURE) ×3 IMPLANT
SUT ETHIBON 2 0 V 52N 30 (SUTURE) ×9 IMPLANT
SUT ETHIBON EXCEL 2-0 V-5 (SUTURE) IMPLANT
SUT ETHIBOND 2 0 SH (SUTURE) ×5
SUT ETHIBOND 2 0 SH 36X2 (SUTURE) ×10 IMPLANT
SUT ETHIBOND V-5 VALVE (SUTURE) IMPLANT
SUT PROLENE 3 0 SH 1 (SUTURE) ×3 IMPLANT
SUT PROLENE 3 0 SH 48 (SUTURE) ×6 IMPLANT
SUT PROLENE 3 0 SH DA (SUTURE) IMPLANT
SUT PROLENE 4 0 RB 1 (SUTURE) ×16
SUT PROLENE 4-0 RB1 .5 CRCL 36 (SUTURE) ×32 IMPLANT
SUT PROLENE 5 0 C 1 36 (SUTURE) ×12 IMPLANT
SUT PROLENE 5 0 RB 2 (SUTURE) ×6 IMPLANT
SUT SILK  1 MH (SUTURE) ×2
SUT SILK 1 MH (SUTURE) ×4 IMPLANT
SUT SILK 1 TIES 10X30 (SUTURE) ×3 IMPLANT
SUT SILK 2 0 SH CR/8 (SUTURE) ×12 IMPLANT
SUT SILK 2 0 TIES 10X30 (SUTURE) ×3 IMPLANT
SUT SILK 2 0 TIES 17X18 (SUTURE)
SUT SILK 2 0SH CR/8 30 (SUTURE) ×3 IMPLANT
SUT SILK 2-0 18XBRD TIE BLK (SUTURE) IMPLANT
SUT SILK 3 0 SH CR/8 (SUTURE) ×3 IMPLANT
SUT STEEL 6MS V (SUTURE) IMPLANT
SUT STEEL STERNAL CCS#1 18IN (SUTURE) IMPLANT
SUT STEEL SZ 6 DBL 3X14 BALL (SUTURE) ×9 IMPLANT
SUT TEM PAC WIRE 2 0 SH (SUTURE) ×12 IMPLANT
SUT VIC AB 1 CTX 36 (SUTURE) ×4
SUT VIC AB 1 CTX36XBRD ANBCTR (SUTURE) ×8 IMPLANT
SUT VIC AB 2-0 CT1 27 (SUTURE)
SUT VIC AB 2-0 CT1 TAPERPNT 27 (SUTURE) IMPLANT
SUT VIC AB 2-0 CTX 27 (SUTURE) ×6 IMPLANT
SUT VIC AB 3-0 X1 27 (SUTURE) ×6 IMPLANT
SYSTEM SAHARA CHEST DRAIN ATS (WOUND CARE) ×3 IMPLANT
TAPE CLOTH SURG 4X10 WHT LF (GAUZE/BANDAGES/DRESSINGS) ×3 IMPLANT
TOWEL GREEN STERILE (TOWEL DISPOSABLE) ×3 IMPLANT
TOWEL GREEN STERILE FF (TOWEL DISPOSABLE) ×3 IMPLANT
TRAY FOLEY SILVER 14FR TEMP (SET/KITS/TRAYS/PACK) IMPLANT
TUBE CONNECTING 20X1/4 (TUBING) ×3 IMPLANT
UNDERPAD 30X30 (UNDERPADS AND DIAPERS) ×3 IMPLANT
WATER STERILE IRR 1000ML POUR (IV SOLUTION) ×6 IMPLANT
YANKAUER SUCT BULB TIP NO VENT (SUCTIONS) ×3 IMPLANT

## 2017-08-15 NOTE — Progress Notes (Signed)
Pt extubated to 4L [place] per rapid wean protocol. Pt doing very well. Positive cuff leak prior to extubation. Pt able to vocalize. Vital signs stable. IS 1250 x 5. Will con to monitor

## 2017-08-15 NOTE — Brief Op Note (Signed)
08/15/2017  2:53 PM  PATIENT:  Lawrence Liu  62 y.o. male  PRE-OPERATIVE DIAGNOSIS:   root phenotype aortic root aneurysm,  POST-OPERATIVE DIAGNOSIS:   root phenotype aortic root aneurysm,  PROCEDURE:  Procedure(s):  VALVE SPARING AORTIC ROOT REPLACEMENT -32 mm Valsalva Graft -Re-implantation Right and Left Coronary Arteries  TRANSESOPHAGEAL ECHOCARDIOGRAM  SURGEON:  Surgeon(s) and Role:    * Bartle, Fernande Boyden, MD - Primary  PHYSICIAN ASSISTANT: Ellwood Handler PA-C   ANESTHESIA:   general  EBL:  500 mL   BLOOD ADMINISTERED: CELLSAVER  DRAINS: Mediastinal Chest Tubes   LOCAL MEDICATIONS USED:  NONE  SPECIMEN:  Source of Specimen:  Aortic Root  DISPOSITION OF SPECIMEN:  PATHOLOGY  COUNTS:  YES  TOURNIQUET:  * No tourniquets in log *  DICTATION: .Dragon Dictation  PLAN OF CARE: Admit to inpatient   PATIENT DISPOSITION:  ICU - intubated and hemodynamically stable.   Delay start of Pharmacological VTE agent (>24hrs) due to surgical blood loss or risk of bleeding: yes

## 2017-08-15 NOTE — Anesthesia Procedure Notes (Signed)
Arterial Line Insertion Start/End1/14/2019 7:00 AM, 08/15/2017 7:05 AM Performed by: CRNA  Patient location: Pre-op. Preanesthetic checklist: patient identified, IV checked, site marked, risks and benefits discussed, surgical consent, monitors and equipment checked, pre-op evaluation, timeout performed and anesthesia consent Lidocaine 1% used for infiltration Right, radial was placed Catheter size: 20 G Hand hygiene performed  and maximum sterile barriers used   Attempts: 2 Procedure performed without using ultrasound guided technique. Following insertion, dressing applied and Biopatch. Post procedure assessment: normal  Patient tolerated the procedure well with no immediate complications.

## 2017-08-15 NOTE — Anesthesia Procedure Notes (Signed)
Central Venous Catheter Insertion Performed by: Roderic Palau, MD, anesthesiologist Start/End1/14/2019 6:55 AM, 08/15/2017 7:05 AM Patient location: Pre-op. Preanesthetic checklist: patient identified, IV checked, site marked, risks and benefits discussed, surgical consent, monitors and equipment checked, pre-op evaluation, timeout performed and anesthesia consent Position: Trendelenburg Lidocaine 1% used for infiltration and patient sedated Hand hygiene performed , maximum sterile barriers used  and Seldinger technique used Catheter size: 9 Fr Total catheter length 10. Central line was placed.MAC introducer Swan type:thermodilution Procedure performed using ultrasound guided technique. Ultrasound Notes:anatomy identified, needle tip was noted to be adjacent to the nerve/plexus identified, no ultrasound evidence of intravascular and/or intraneural injection and image(s) printed for medical record Attempts: 1 Following insertion, line sutured, dressing applied and Biopatch. Post procedure assessment: blood return through all ports, free fluid flow and no air  Patient tolerated the procedure well with no immediate complications.

## 2017-08-15 NOTE — Progress Notes (Signed)
Rapid wean started at 1630, RR 4, FIO2 40%, pt tol well.

## 2017-08-15 NOTE — Anesthesia Postprocedure Evaluation (Signed)
Anesthesia Post Note  Patient: Lawrence Liu  Procedure(s) Performed: ASCENDING AORTIC ROOT REPLACEMENT, (VALVE SPARING ROOT REPLACEMENT) (N/A Chest) TRANSESOPHAGEAL ECHOCARDIOGRAM (TEE) (N/A )     Patient location during evaluation: SICU Anesthesia Type: General Level of consciousness: sedated Pain management: pain level controlled Vital Signs Assessment: post-procedure vital signs reviewed and stable Respiratory status: patient remains intubated per anesthesia plan Cardiovascular status: stable Postop Assessment: no apparent nausea or vomiting Anesthetic complications: no    Last Vitals:  Vitals:   08/15/17 1539 08/15/17 1540  BP:    Pulse: 89 89  Resp: 12 12  Temp: (!) 35.7 C (!) 35.7 C  SpO2: 100% 100%    Last Pain:  Vitals:   08/15/17 0629  TempSrc: Oral                 Sakina Briones DAVID

## 2017-08-15 NOTE — Transfer of Care (Signed)
Immediate Anesthesia Transfer of Care Note  Patient: [name]  Procedure(s) Performed: ASCENDING AORTIC ROOT REPLACEMENT, (VALVE SPARING ROOT REPLACEMENT) (N/A Chest) TRANSESOPHAGEAL ECHOCARDIOGRAM (TEE) (N/A )  Patient Location: SICU  Anesthesia Type:General  Level of Consciousness: sedated and unresponsive  Airway & Oxygen Therapy: Patient remains intubated per anesthesia plan and Patient placed on Ventilator (see vital sign flow sheet for setting)  Post-op Assessment: Report given to RN and Post -op Vital signs reviewed and stable  Post vital signs: Reviewed and stable  Last Vitals:  Vitals:   08/15/17 0629 08/15/17 1327  BP: 134/83 (!) 80/50  Pulse:  80  Resp: 20 12  Temp: 36.6 C   SpO2: 98% 99%    Last Pain:  Vitals:   08/15/17 0629  TempSrc: Oral         Complications: No apparent anesthesia complications

## 2017-08-15 NOTE — Anesthesia Procedure Notes (Signed)
Procedure Name: Intubation Date/Time: 08/15/2017 7:45 AM Performed by: Mariea Clonts, CRNA Pre-anesthesia Checklist: Patient identified, Emergency Drugs available, Suction available and Patient being monitored Patient Re-evaluated:Patient Re-evaluated prior to induction Oxygen Delivery Method: Circle System Utilized Preoxygenation: Pre-oxygenation with 100% oxygen Induction Type: IV induction Ventilation: Mask ventilation without difficulty and Oral airway inserted - appropriate to patient size Laryngoscope Size: Mac and 4 Grade View: Grade III Tube type: Oral Tube size: 8.0 mm Number of attempts: 2 Airway Equipment and Method: Stylet and Oral airway Placement Confirmation: ETT inserted through vocal cords under direct vision,  positive ETCO2 and breath sounds checked- equal and bilateral Tube secured with: Tape Dental Injury: Teeth and Oropharynx as per pre-operative assessment  Difficulty Due To: Difficult Airway- due to limited oral opening and Difficult Airway- due to dentition Future Recommendations: Recommend- induction with short-acting agent, and alternative techniques readily available

## 2017-08-15 NOTE — Progress Notes (Signed)
  Echocardiogram Echocardiogram Transesophageal has been performed.  Lawrence Liu 08/15/2017, 8:46 AM

## 2017-08-15 NOTE — Progress Notes (Signed)
CT surgery p.m. Rounds  Doing well after valve sparing root replacement and CABG Neuro intact Lungs clear Minimal chest tube output

## 2017-08-15 NOTE — Interval H&P Note (Signed)
History and Physical Interval Note:  08/15/2017 5:37 AM  Lawrence Liu  has presented today for surgery, with the diagnosis of  root phenotype aortic root aneurysm,  The various methods of treatment have been discussed with the patient and family. After consideration of risks, benefits and other options for treatment, the patient has consented to  Procedure(s): ASCENDING AORTIC ROOT REPLACEMENT, (VALVE SPARING ROOT REPLACEMENT) (N/A) TRANSESOPHAGEAL ECHOCARDIOGRAM (TEE) (N/A) as a surgical intervention .  The patient's history has been reviewed, patient examined, no change in status, stable for surgery.  I have reviewed the patient's chart and labs.  Questions were answered to the patient's satisfaction.     [name]

## 2017-08-15 NOTE — Anesthesia Procedure Notes (Signed)
Central Venous Catheter Insertion Performed by: Roderic Palau, MD, anesthesiologist Start/End1/14/2019 6:55 AM, 08/15/2017 7:05 AM Patient location: Pre-op. Preanesthetic checklist: patient identified, IV checked, site marked, risks and benefits discussed, surgical consent, monitors and equipment checked, pre-op evaluation, timeout performed and anesthesia consent Hand hygiene performed  and maximum sterile barriers used  PA cath was placed.Swan type:thermodilution Procedure performed without using ultrasound guided technique. Attempts: 1 Patient tolerated the procedure well with no immediate complications.

## 2017-08-15 NOTE — Anesthesia Preprocedure Evaluation (Signed)
Anesthesia Evaluation  Patient identified by MRN, date of birth, ID band Patient awake    Reviewed: Allergy & Precautions, NPO status , Patient's Chart, lab work & pertinent test results  Airway Mallampati: I  TM Distance: >3 FB Neck ROM: Full    Dental   Pulmonary    Pulmonary exam normal        Cardiovascular hypertension, Pt. on medications + CAD  Normal cardiovascular exam     Neuro/Psych    GI/Hepatic GERD  Controlled and Medicated,  Endo/Other    Renal/GU      Musculoskeletal   Abdominal   Peds  Hematology   Anesthesia Other Findings   Reproductive/Obstetrics                             Anesthesia Physical Anesthesia Plan  ASA: III  Anesthesia Plan: General   Post-op Pain Management:    Induction: Intravenous  PONV Risk Score and Plan: 2 and Treatment may vary due to age or medical condition  Airway Management Planned: Oral ETT  Additional Equipment: Arterial line, CVP, PA Cath, TEE and Ultrasound Guidance Line Placement  Intra-op Plan:   Post-operative Plan: Post-operative intubation/ventilation  Informed Consent: I have reviewed the patients History and Physical, chart, labs and discussed the procedure including the risks, benefits and alternatives for the proposed anesthesia with the patient or authorized representative who has indicated his/her understanding and acceptance.     Plan Discussed with: CRNA and Surgeon  Anesthesia Plan Comments:         Anesthesia Quick Evaluation

## 2017-08-15 NOTE — Progress Notes (Signed)
Pt placed on CP/PS 10/5. Pt tol well.

## 2017-08-15 NOTE — Op Note (Signed)
CARDIOVASCULAR SURGERY OPERATIVE NOTE  04/15/2014  Surgeon:  [name], MD  First Assistant: Ellwood Handler, PA-C   Preoperative Diagnosis:  5.5 cm aortic root  aneurysm    Postoperative Diagnosis:  Same   Procedure:  1. Median Sternotomy 2. Extracorporeal circulation 3.   Valve-sparing aortic root replacement with reimplantation of the native aortic valve and left and right coronary arteries ( 32 mm Gelweave valsalva graft).  Anesthesia:  General Endotracheal   Clinical History/Surgical Indication:  The patient is a 62 year old gentleman who I have been following since initially seeing him on 10/06/2016 when a  CTA of the chest on 06/22/2016 showed stable aneurysmal dilation of the aortic root but the radiologist thought that the diameter at the sinus level may be as large as 5.5 cm. We decided to do a gated cardiac CTA this time to get a more accurate measurement at the root level. This showed the diameter at the sinus level to be 49 x 47 x 43 mm with a maximum diameter across the sinuses of 55 mm and was felt to be unchanged from his previous study of 06/22/2016. His last echo on 10/18/2016 showed a trileaflet aortic valve with no stenosis or regurgitation. The aortic root was measured at 50 mm. He had a gated cardiac CT on 04/27/2017 which shows the diameter at the sinus level to be 49 x 47 x 43 mm with a maximum diameter across the sinuses of 55 mm and was felt to be unchanged from his previous study of 06/22/2016.  He has a 5.5 cm aortic root aneurysm that appears stable compared to last year. The maximum orthogonal measurement across the sinuses is 49 mm across the left coronary sinus but the maximum diameter is 55 mm if measured across the widest part of the asymmetric sinuses. Echo has shown no AI. 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 I have reviewed all of his prior studies and I think this aneurysm is stable but it is at the diameter that surgical repair is  recommended bases on the current study. This aneurysm is of the root phenotype which probably increases his risk of aortic dissection. His BP is well-controlled. I reviewed the images with himand his wife  and answered their questions. I don't think he needs to have a cardiac cath since the cardiac CT did not show any significant disease in the coronary arteries. I discussed the surgical repair with valve sparing root replacement. I also discussed the possibility that the anatomy at the time of surgery may preclude a valve sparing procedure and he may require a Bentall procedure. I discussed alternatives, benefits and risks; including but not limited to bleeding, blood transfusion, infection, stroke, myocardial infarction, graft failure, heart block requiring a permanent pacemaker, organ dysfunction, and death.Lawrence A Brandeunderstands and agrees to proceed.    Preparation:  The patient was seen in the preoperative holding area and the correct patient, correct operation were confirmed with the patient after reviewing the medical record and catheterization. The consent was signed by me. Preoperative antibiotics were given. A pulmonary arterial line and radial arterial line were placed by the anesthesia team. The patient was taken back to the operating room and positioned supine on the operating room table. After being placed under general endotracheal anesthesia by the anesthesia team a foley catheter was placed. The neck, chest, abdomen, and both legs were prepped with betadine soap and solution and draped in the usual sterile manner. A surgical time-out was  taken and the correct patient and operative procedure were confirmed with the nursing and anesthesia staff.    Pre-bypass TEE:  Performed by Dr. Lillia Abed    This showed a tri-leaflet aortic valve with no AI. The leaflets appeared structurally normal. LV function looked normal. There was no MR. Aortic root aneurysm.   Cardiopulmonary  Bypass:  A median sternotomy was performed. The pericardium was opened in the midline. Right ventricular function appeared normal. The aortic root was aneurysmal but then the aorta tapered back to normal size by the mid to distal ascending aorta. There were no contraindications to aortic cannulation or cross-clamping. The patient was fully systemically heparinized and the ACT was maintained > 400 sec. The distal ascending aorta was cannulated with a 20 F aortic cannula for arterial inflow. Venous cannulation was performed via the right atrial appendage using a two-staged venous cannula. An antegrade cardioplegia/vent cannula was inserted into the mid-ascending aorta. Aortic occlusion was performed with a single cross-clamp.    Valve sparing root replacement:  The patient was placed on cardiopulmonary bypass and a left ventricular vent was placed via the right superior pulmonary vein. Systemic cooling was begun with a goal temperature of 32 degrees centigrade by bladder and rectal temperature probes. A retrograde cardioplegia cannula was placed through the right atrium into the coronary sinus without difficulty. CO2 was insufflated into the pericardial cavity to minimize intra-cardiac air. The aorta was cross-clamped and 600cc of cold blood antegrade cardioplegia was given with quick arrest of the heart. The cold blood retrograde cardioplegia was given throughout the remainder of the cross-clamp period at 20 minute intervals. A temperature probe was placed in the septum and an insulating pad in the pericardium.  Valve sparing root replacement:   The ascending aorta was mobilized from the right pulmonary artery and main PA. It was opened longitudinally to a point 2 cm above the STJ and the valve inspected. There were 3 leaflets that were thin and pliable. There was no calcium. There did not appear to be any prolapse. The leaflets met along a symmetrical line of coaptation in a "Mercedes star"  configuration. The right and left coronary arteries were removed from the aortic root with a button of aortic wall around the ostia. The coronaries were displaced cephalad. They were retracted carefully out of the way with stay sutures to prevent rotation. Stay sutures were placed at each commissure. The plane between the aortic root and the surrounding structures was dissected using electrocautery down to level just below the nadir of the valve cusps. The excess aortic sinus tissue was excised leaving a 5 mm rim above the annulus. A 2-0 Ethibond horizontal mattress suture was placed below the nadir of each cusp from the ventricular side to outside the aorta. 3 additional sutures were placed beneath each commissure so that all 6 sutures were in the same plane. The optimal STJ diameter was determined using a 29 mm valve sizer and a 32 mm Gelweave Valsalva graft chosen ( Cat # [id number], SN [id number]). The proximal skirt of the graft was shortened to 3 rings. The 6 sub-annular sutures were placed through the skirt at the proper position. The commissures were brought inside the graft and the graft lowered into place. The sub-annular sutures were tied. The position of the commissures were determined and they were held in place using pledgetted 4-0 prolene mattress sutures through the graft from inside to outside. Then the distal valve suture line was performed using 4-0 prolene sutures beginning  at the nadir of each cusp and tieing the sutures together at the top of the commissures outside the graft. This resulted in a " Mercedes star" appearance of the valve which appeared completely competent. Small openings were made in the graft for the coronary anastomoses using a thermal cautery. Then the left and right coronary buttons were anastomosed to the graft in an end to side manner using continuous 5-0 prolene suture. A light coating of CoSeal was applied to each anastomosis for hemostasis. The graft was then cut to the  appropriate length and anastomosed end to end to the distal ascending aorta using continuous 4-0 prolene suture with a felt strip to reinforce the anastomosis. CoSeal was applied to seal the needle holes in the grafts.  A vent cannula was placed into the graft to remove any air. Deairing maneuvers were performed and the bed placed in trendelenburg position.   Completion:  The patient was rewarmed to 37 degrees Centigrade.  The crossclamp was removed with a time of 160 minutes. There was spontaneous return of sinus rhythm. The anastomoses were checked for hemostasis. The position of the grafts was satisfactory. The vascular anastomoses all appeared hemostatic. Two temporary epicardial pacing wires were placed on the right atrium and two on the right ventricle.  The LV vent and retrograde cannula were removed. The patient was weaned from CPB without difficulty on no inotropes. CPB time was 185 minutes. Cardiac output was 4 LPM. Heparin was fully reversed with protamine.  Hemostasis was achieved. Mediastinal drainage tubes were placed. The sternum was closed with  double #6 stainless steel wires. The fascia was closed with continuous # 1 vicryl suture. The subcutaneous tissue was closed with 2-0 vicryl continuous suture. The skin was closed with 3-0 vicryl subcuticular suture. All sponge, needle, and instrument counts were reported correct at the end of the case. Dry sterile dressings were placed over the incisions and around the chest tubes which were connected to pleurevac suction. The patient was then transported to the surgical intensive care unit in critical but stable condition.   Post-bypass TEE:  Normal LV and RV function.  No aortic insufficiency or stenosis. No MR.

## 2017-08-16 ENCOUNTER — Inpatient Hospital Stay (HOSPITAL_COMMUNITY): Payer: 59

## 2017-08-16 ENCOUNTER — Encounter (HOSPITAL_COMMUNITY): Payer: Self-pay | Admitting: Surgery

## 2017-08-16 LAB — CBC
HEMATOCRIT: 34.1 % — AB (ref 39.0–52.0)
HEMATOCRIT: 36.2 % — AB (ref 39.0–52.0)
HEMOGLOBIN: 11.1 g/dL — AB (ref 13.0–17.0)
Hemoglobin: 11.7 g/dL — ABNORMAL LOW (ref 13.0–17.0)
MCH: 26.4 pg (ref 26.0–34.0)
MCH: 26.7 pg (ref 26.0–34.0)
MCHC: 32.6 g/dL (ref 30.0–36.0)
MCHC: 32.3 g/dL (ref 30.0–36.0)
MCV: 81.2 fL (ref 78.0–100.0)
MCV: 82.6 fL (ref 78.0–100.0)
PLATELETS: 150 10*3/uL (ref 150–400)
Platelets: 130 10*3/uL — ABNORMAL LOW (ref 150–400)
RBC: 4.2 MIL/uL — AB (ref 4.22–5.81)
RBC: 4.38 MIL/uL (ref 4.22–5.81)
RDW: 13.8 % (ref 11.5–15.5)
RDW: 14.5 % (ref 11.5–15.5)
WBC: 11.6 10*3/uL — AB (ref 4.0–10.5)
WBC: 14 10*3/uL — AB (ref 4.0–10.5)

## 2017-08-16 LAB — GLUCOSE, CAPILLARY
GLUCOSE-CAPILLARY: 119 mg/dL — AB (ref 65–99)
GLUCOSE-CAPILLARY: 124 mg/dL — AB (ref 65–99)
GLUCOSE-CAPILLARY: 127 mg/dL — AB (ref 65–99)
GLUCOSE-CAPILLARY: 129 mg/dL — AB (ref 65–99)
GLUCOSE-CAPILLARY: 132 mg/dL — AB (ref 65–99)
GLUCOSE-CAPILLARY: 138 mg/dL — AB (ref 65–99)
GLUCOSE-CAPILLARY: 141 mg/dL — AB (ref 65–99)
GLUCOSE-CAPILLARY: 145 mg/dL — AB (ref 65–99)
GLUCOSE-CAPILLARY: 155 mg/dL — AB (ref 65–99)
Glucose-Capillary: 117 mg/dL — ABNORMAL HIGH (ref 65–99)
Glucose-Capillary: 117 mg/dL — ABNORMAL HIGH (ref 65–99)
Glucose-Capillary: 128 mg/dL — ABNORMAL HIGH (ref 65–99)
Glucose-Capillary: 130 mg/dL — ABNORMAL HIGH (ref 65–99)
Glucose-Capillary: 131 mg/dL — ABNORMAL HIGH (ref 65–99)
Glucose-Capillary: 135 mg/dL — ABNORMAL HIGH (ref 65–99)
Glucose-Capillary: 136 mg/dL — ABNORMAL HIGH (ref 65–99)
Glucose-Capillary: 140 mg/dL — ABNORMAL HIGH (ref 65–99)
Glucose-Capillary: 167 mg/dL — ABNORMAL HIGH (ref 65–99)

## 2017-08-16 LAB — POCT I-STAT, CHEM 8
BUN: 21 mg/dL — ABNORMAL HIGH (ref 6–20)
CREATININE: 0.8 mg/dL (ref 0.61–1.24)
Calcium, Ion: 1.21 mmol/L (ref 1.15–1.40)
Chloride: 100 mmol/L — ABNORMAL LOW (ref 101–111)
GLUCOSE: 182 mg/dL — AB (ref 65–99)
HCT: 35 % — ABNORMAL LOW (ref 39.0–52.0)
HEMOGLOBIN: 11.9 g/dL — AB (ref 13.0–17.0)
Potassium: 4 mmol/L (ref 3.5–5.1)
Sodium: 135 mmol/L (ref 135–145)
TCO2: 25 mmol/L (ref 22–32)

## 2017-08-16 LAB — BASIC METABOLIC PANEL
ANION GAP: 7 (ref 5–15)
BUN: 17 mg/dL (ref 6–20)
CO2: 21 mmol/L — AB (ref 22–32)
CREATININE: 0.78 mg/dL (ref 0.61–1.24)
Calcium: 7.8 mg/dL — ABNORMAL LOW (ref 8.9–10.3)
Chloride: 108 mmol/L (ref 101–111)
GFR calc non Af Amer: >60 mL/min (ref >60)
Glucose, Bld: 132 mg/dL — ABNORMAL HIGH (ref 65–99)
POTASSIUM: 4 mmol/L (ref 3.5–5.1)
SODIUM: 136 mmol/L (ref 135–145)

## 2017-08-16 LAB — CREATININE, SERUM: CREATININE: 0.98 mg/dL (ref 0.61–1.24)

## 2017-08-16 LAB — MAGNESIUM
MAGNESIUM: 2.2 mg/dL (ref 1.7–2.4)
MAGNESIUM: 2.2 mg/dL (ref 1.7–2.4)

## 2017-08-16 MED ORDER — ENOXAPARIN SODIUM 40 MG/0.4ML ~~LOC~~ SOLN
40.0000 mg | Freq: Every day | SUBCUTANEOUS | Status: DC
  Administered 2017-08-16 – 2017-08-20 (×5): 40 mg via SUBCUTANEOUS
  Filled 2017-08-16 (×5): qty 0.4

## 2017-08-16 MED ORDER — FUROSEMIDE 10 MG/ML IJ SOLN
40.0000 mg | Freq: Once | INTRAMUSCULAR | Status: AC
  Administered 2017-08-16: 40 mg via INTRAVENOUS

## 2017-08-16 MED ORDER — POTASSIUM CHLORIDE CRYS ER 20 MEQ PO TBCR
20.0000 meq | EXTENDED_RELEASE_TABLET | Freq: Two times a day (BID) | ORAL | Status: AC
  Administered 2017-08-16 (×2): 20 meq via ORAL
  Filled 2017-08-16 (×2): qty 1

## 2017-08-16 MED ORDER — INSULIN DETEMIR 100 UNIT/ML ~~LOC~~ SOLN
10.0000 [IU] | Freq: Once | SUBCUTANEOUS | Status: AC
  Administered 2017-08-16: 10 [IU] via SUBCUTANEOUS
  Filled 2017-08-16: qty 0.1

## 2017-08-16 MED ORDER — INSULIN ASPART 100 UNIT/ML ~~LOC~~ SOLN
0.0000 [IU] | SUBCUTANEOUS | Status: DC
  Administered 2017-08-16 (×2): 2 [IU] via SUBCUTANEOUS
  Administered 2017-08-16: 4 [IU] via SUBCUTANEOUS

## 2017-08-16 MED FILL — Thrombin (Recombinant) For Soln 5000 Unit: CUTANEOUS | Qty: 5000 | Status: AC

## 2017-08-16 MED FILL — Magnesium Sulfate Inj 50%: INTRAMUSCULAR | Qty: 10 | Status: AC

## 2017-08-16 MED FILL — Gelatin Absorbable MT Powder: OROMUCOSAL | Qty: 1 | Status: AC

## 2017-08-16 MED FILL — Heparin Sodium (Porcine) Inj 1000 Unit/ML: INTRAMUSCULAR | Qty: 30 | Status: AC

## 2017-08-16 MED FILL — Heparin Sodium (Porcine) Inj 1000 Unit/ML: INTRAMUSCULAR | Qty: 2500 | Status: AC

## 2017-08-16 MED FILL — Potassium Chloride Inj 2 mEq/ML: INTRAVENOUS | Qty: 40 | Status: AC

## 2017-08-16 NOTE — Progress Notes (Signed)
1 Day Post-Op Procedure(s) (LRB): ASCENDING AORTIC ROOT REPLACEMENT, (VALVE SPARING ROOT REPLACEMENT) (N/A) TRANSESOPHAGEAL ECHOCARDIOGRAM (TEE) (N/A) Subjective: No complaints. Has been doing 1750 on IS  Objective: Vital signs in last 24 hours: Temp:  [94.6 F (34.8 C)-99.5 F (37.5 C)] 98.4 F (36.9 C) (01/15 0700) Pulse Rate:  [80-92] 83 (01/15 0700) Cardiac Rhythm: Atrial paced (01/15 0000) Resp:  [10-23] 16 (01/15 0700) BP: (80-125)/(50-89) 112/76 (01/15 0700) SpO2:  [93 %-100 %] 100 % (01/15 0700) Arterial Line BP: (73-147)/(47-80) 122/58 (01/15 0700) FiO2 (%):  [50 %] 50 % (01/14 1327) Weight:  [99.5 kg (219 lb 6.4 oz)] 99.5 kg (219 lb 6.4 oz) (01/15 0444)  Hemodynamic parameters for last 24 hours: PAP: (14-34)/(2-21) 25/10 CO:  [3.2 L/min-8.3 L/min] 8.3 L/min CI:  [1.5 L/min/m2-3.8 L/min/m2] 3.8 L/min/m2  Intake/Output from previous day: 01/14 0701 - 01/15 0700 In: 5591.7 [I.V.:4241.7; Blood:350; IV Piggyback:1000] Out: 3295 [Urine:2625; Blood:500; Chest Tube:350] Intake/Output this shift: No intake/output data recorded.  General appearance: alert and cooperative Neurologic: intact Heart: regular rate and rhythm, S1, S2 normal, no murmur, click, rub or gallop Lungs: clear to auscultation bilaterally Extremities: edema mild Wound: dressing dry.  Lab Results: Recent Labs    08/15/17 1759 08/15/17 1848 08/16/17 0359  WBC 11.8*  --  11.6*  HGB 11.9* 11.9* 11.1*  HCT 36.5* 35.0* 34.1*  PLT 148*  --  130*   BMET:  Recent Labs    08/15/17 1848 08/16/17 0359  NA 142 136  K 4.6 4.0  CL 106 108  CO2  --  21*  GLUCOSE 134* 132*  BUN 18 17  CREATININE 0.80 0.78  CALCIUM  --  7.8*    PT/INR:  Recent Labs    08/15/17 1325  LABPROT 16.7*  INR 1.36   ABG    Component Value Date/Time   PHART 7.281 (L) 08/15/2017 1848   HCO3 21.1 08/15/2017 1848   TCO2 24 08/15/2017 1848   TCO2 22 08/15/2017 1848   ACIDBASEDEF 6.0 (H) 08/15/2017 1848   O2SAT 88.0  08/15/2017 1848   CBG (last 3)  Recent Labs    08/16/17 0410 08/16/17 0502 08/16/17 0607  GLUCAP 127* 138* 141*   ECG: sinus, no acute changes  CXR: clear, may be tiny left apical ptx.  Assessment/Plan: S/P Procedure(s) (LRB): ASCENDING AORTIC ROOT REPLACEMENT, (VALVE SPARING ROOT REPLACEMENT) (N/A) TRANSESOPHAGEAL ECHOCARDIOGRAM (TEE) (N/A)  He has been hemodynamically stable postop. Off neo. Start low dose Lopressor Mobilize Diuresis Diabetes control: preop Hgb A1c was 5.8 on no meds. He is still on insulin drip this am so will give one small dose of Levemir to get that off and follow Q 4 CBG's today. d/c tubes/lines Continue foley due to diuresing patient and patient in ICU See progression orders   LOS: 1 day    [name] 08/16/2017

## 2017-08-16 NOTE — Progress Notes (Signed)
      [address]       [place] [address]             [phone]      Up in chair. No complaints  BP 119/83   Pulse 95   Temp 98.4 F (36.9 C) (Oral)   Resp (!) 21   Wt 219 lb 6.4 oz (99.5 kg)   SpO2 98%   BMI 28.95 kg/m   Intake/Output Summary (Last 24 hours) at 08/16/2017 1804 Last data filed at 08/16/2017 1600 Gross per 24 hour  Intake 1975.61 ml  Output 3005 ml  Net -1029.39 ml   K= 4.0, Hct= 36  Doing well POD # 1  Joeseph Verville C. Roxan Hockey, MD [facility] [phone]

## 2017-08-17 ENCOUNTER — Other Ambulatory Visit: Payer: Self-pay

## 2017-08-17 ENCOUNTER — Inpatient Hospital Stay (HOSPITAL_COMMUNITY): Payer: 59

## 2017-08-17 ENCOUNTER — Encounter (HOSPITAL_COMMUNITY): Payer: Self-pay | Admitting: General Practice

## 2017-08-17 LAB — CBC
HCT: 33.9 % — ABNORMAL LOW (ref 39.0–52.0)
HEMOGLOBIN: 11.1 g/dL — AB (ref 13.0–17.0)
MCH: 26.9 pg (ref 26.0–34.0)
MCHC: 32.7 g/dL (ref 30.0–36.0)
MCV: 82.3 fL (ref 78.0–100.0)
Platelets: 145 10*3/uL — ABNORMAL LOW (ref 150–400)
RBC: 4.12 MIL/uL — ABNORMAL LOW (ref 4.22–5.81)
RDW: 14.1 % (ref 11.5–15.5)
WBC: 14.8 10*3/uL — ABNORMAL HIGH (ref 4.0–10.5)

## 2017-08-17 LAB — BASIC METABOLIC PANEL
ANION GAP: 8 (ref 5–15)
BUN: 20 mg/dL (ref 6–20)
CALCIUM: 8.4 mg/dL — AB (ref 8.9–10.3)
CO2: 24 mmol/L (ref 22–32)
Chloride: 103 mmol/L (ref 101–111)
Creatinine, Ser: 0.88 mg/dL (ref 0.61–1.24)
GLUCOSE: 136 mg/dL — AB (ref 65–99)
Potassium: 4.2 mmol/L (ref 3.5–5.1)
Sodium: 135 mmol/L (ref 135–145)

## 2017-08-17 LAB — GLUCOSE, CAPILLARY
GLUCOSE-CAPILLARY: 117 mg/dL — AB (ref 65–99)
Glucose-Capillary: 120 mg/dL — ABNORMAL HIGH (ref 65–99)
Glucose-Capillary: 138 mg/dL — ABNORMAL HIGH (ref 65–99)
Glucose-Capillary: 147 mg/dL — ABNORMAL HIGH (ref 65–99)
Glucose-Capillary: 147 mg/dL — ABNORMAL HIGH (ref 65–99)
Glucose-Capillary: 171 mg/dL — ABNORMAL HIGH (ref 65–99)

## 2017-08-17 MED ORDER — SODIUM CHLORIDE 0.9% FLUSH
3.0000 mL | Freq: Two times a day (BID) | INTRAVENOUS | Status: DC
  Administered 2017-08-17 – 2017-08-21 (×6): 3 mL via INTRAVENOUS

## 2017-08-17 MED ORDER — SODIUM CHLORIDE 0.9 % IV SOLN: 250.0000 mL | INTRAVENOUS | Status: DC | PRN

## 2017-08-17 MED ORDER — FUROSEMIDE 40 MG PO TABS
40.0000 mg | ORAL_TABLET | Freq: Every day | ORAL | Status: DC
  Administered 2017-08-17 – 2017-08-18 (×2): 40 mg via ORAL
  Filled 2017-08-17 (×2): qty 1

## 2017-08-17 MED ORDER — METOLAZONE 5 MG PO TABS
5.0000 mg | ORAL_TABLET | Freq: Once | ORAL | Status: AC
  Administered 2017-08-17: 5 mg via ORAL
  Filled 2017-08-17: qty 1

## 2017-08-17 MED ORDER — ONDANSETRON HCL 4 MG/2ML IJ SOLN
4.0000 mg | Freq: Four times a day (QID) | INTRAMUSCULAR | Status: DC | PRN
  Administered 2017-08-19 – 2017-08-20 (×4): 4 mg via INTRAVENOUS
  Filled 2017-08-17 (×4): qty 2

## 2017-08-17 MED ORDER — ACETAMINOPHEN 325 MG PO TABS
650.0000 mg | ORAL_TABLET | Freq: Four times a day (QID) | ORAL | Status: DC | PRN
  Administered 2017-08-17 – 2017-08-20 (×6): 650 mg via ORAL
  Filled 2017-08-17 (×6): qty 2

## 2017-08-17 MED ORDER — TRAMADOL HCL 50 MG PO TABS
50.0000 mg | ORAL_TABLET | ORAL | Status: DC | PRN
  Administered 2017-08-17 – 2017-08-20 (×4): 50 mg via ORAL
  Filled 2017-08-17 (×4): qty 1

## 2017-08-17 MED ORDER — METOPROLOL TARTRATE 25 MG/10 ML ORAL SUSPENSION: 25.0000 mg | Freq: Two times a day (BID) | ORAL | Status: DC

## 2017-08-17 MED ORDER — OXYCODONE HCL 5 MG PO TABS: 5.0000 mg | ORAL_TABLET | ORAL | Status: DC | PRN

## 2017-08-17 MED ORDER — ASPIRIN EC 325 MG PO TBEC
325.0000 mg | DELAYED_RELEASE_TABLET | Freq: Every day | ORAL | Status: DC
  Administered 2017-08-18 – 2017-08-21 (×4): 325 mg via ORAL
  Filled 2017-08-17 (×4): qty 1

## 2017-08-17 MED ORDER — BISACODYL 10 MG RE SUPP: 10.0000 mg | Freq: Every day | RECTAL | Status: DC | PRN

## 2017-08-17 MED ORDER — MOVING RIGHT ALONG BOOK
Freq: Once | Status: DC
  Filled 2017-08-17: qty 1

## 2017-08-17 MED ORDER — POTASSIUM CHLORIDE CRYS ER 20 MEQ PO TBCR
20.0000 meq | EXTENDED_RELEASE_TABLET | Freq: Two times a day (BID) | ORAL | Status: DC
  Administered 2017-08-17 – 2017-08-18 (×3): 20 meq via ORAL
  Filled 2017-08-17 (×3): qty 1

## 2017-08-17 MED ORDER — METOPROLOL TARTRATE 25 MG PO TABS
25.0000 mg | ORAL_TABLET | Freq: Two times a day (BID) | ORAL | Status: DC
  Administered 2017-08-17: 25 mg via ORAL
  Filled 2017-08-17: qty 1

## 2017-08-17 MED ORDER — SODIUM CHLORIDE 0.9% FLUSH: 3.0000 mL | INTRAVENOUS | Status: DC | PRN

## 2017-08-17 MED ORDER — POTASSIUM CHLORIDE CRYS ER 20 MEQ PO TBCR
40.0000 meq | EXTENDED_RELEASE_TABLET | Freq: Once | ORAL | Status: AC
  Administered 2017-08-17: 40 meq via ORAL
  Filled 2017-08-17: qty 2

## 2017-08-17 MED ORDER — DOCUSATE SODIUM 100 MG PO CAPS
200.0000 mg | ORAL_CAPSULE | Freq: Every day | ORAL | Status: DC
  Administered 2017-08-18 – 2017-08-21 (×4): 200 mg via ORAL
  Filled 2017-08-17 (×4): qty 2

## 2017-08-17 MED ORDER — ONDANSETRON HCL 4 MG PO TABS: 4.0000 mg | ORAL_TABLET | Freq: Four times a day (QID) | ORAL | Status: DC | PRN

## 2017-08-17 MED ORDER — BISACODYL 5 MG PO TBEC: 10.0000 mg | DELAYED_RELEASE_TABLET | Freq: Every day | ORAL | Status: DC | PRN

## 2017-08-17 MED ORDER — INSULIN ASPART 100 UNIT/ML ~~LOC~~ SOLN
0.0000 [IU] | SUBCUTANEOUS | Status: DC
  Administered 2017-08-17 (×3): 2 [IU] via SUBCUTANEOUS
  Administered 2017-08-17: 4 [IU] via SUBCUTANEOUS
  Administered 2017-08-18 (×2): 2 [IU] via SUBCUTANEOUS

## 2017-08-17 MED ORDER — FUROSEMIDE 40 MG PO TABS
40.0000 mg | ORAL_TABLET | Freq: Once | ORAL | Status: AC
  Administered 2017-08-17: 40 mg via ORAL
  Filled 2017-08-17: qty 1

## 2017-08-17 MED ORDER — INSULIN ASPART 100 UNIT/ML ~~LOC~~ SOLN: 0.0000 [IU] | SUBCUTANEOUS | Status: DC

## 2017-08-17 MED FILL — Heparin Sodium (Porcine) Inj 1000 Unit/ML: INTRAMUSCULAR | Qty: 10 | Status: AC

## 2017-08-17 MED FILL — Sodium Chloride IV Soln 0.9%: INTRAVENOUS | Qty: 2000 | Status: AC

## 2017-08-17 MED FILL — Mannitol IV Soln 20%: INTRAVENOUS | Qty: 500 | Status: AC

## 2017-08-17 MED FILL — Lidocaine HCl IV Inj 20 MG/ML: INTRAVENOUS | Qty: 5 | Status: AC

## 2017-08-17 MED FILL — Sodium Bicarbonate IV Soln 8.4%: INTRAVENOUS | Qty: 50 | Status: AC

## 2017-08-17 MED FILL — Electrolyte-R (PH 7.4) Solution: INTRAVENOUS | Qty: 3000 | Status: AC

## 2017-08-17 NOTE — Progress Notes (Signed)
Report called to [facility] patient transported to 4 E via w/c with tlemetry and  all belongings, right hearing aid, glasses cell phone, computer. And clothing. Wife accompanied patient.

## 2017-08-17 NOTE — Progress Notes (Signed)
08/17/2017 1705 Received pt to room 4E-22 a tx from [facility].  Pt is A&O, c/o some soreness in his chest, rates 4/10.  Tele monitor applied and CCMD notified.  Oriented to room, call light and bed.  Call bell in reach, family at bedside. [place]

## 2017-08-17 NOTE — Care Management Note (Signed)
Case Management Note  Patient Details  Name: Lawrence Liu MRN: [id number] Date of Birth: [birth year]  Subjective/Objective:   From home with wife who will be able to assist him after discharge, he also has other family members who will be able to assist also, he works for [employer], he states he has a great support system at home.  He used no DME pta. He has PCP and medication coverage.                  Action/Plan: NCM will follow progression for dc needs.   Expected Discharge Date:                  Expected Discharge Plan:  Home/Self Care  In-House Referral:     Discharge planning Services  CM Consult  Post Acute Care Choice:    Choice offered to:     DME Arranged:    DME Agency:     HH Arranged:    HH Agency:     Status of Service:  In process, will continue to follow  If discussed at Long Length of Stay Meetings, dates discussed:    Additional Comments:  Zenon Mayo, RN 08/17/2017, 10:19 AM

## 2017-08-17 NOTE — Progress Notes (Signed)
Patient ambulates 716ft w/rolling stand-up walker independently.  No complications.  Returns to sit in bedside chair.  No complaints.  Will continue to monitor

## 2017-08-17 NOTE — Progress Notes (Signed)
Foley catheter discontinued @ 0800.  RIJ sleeve/cordis discontinued per protocol by Altha Harm, RN without complications at this time.  Pt remains in bed at this time.  Will continue to monitor closely.

## 2017-08-17 NOTE — Progress Notes (Signed)
2 Days Post-Op Procedure(s) (LRB): ASCENDING AORTIC ROOT REPLACEMENT, (VALVE SPARING ROOT REPLACEMENT) (N/A) TRANSESOPHAGEAL ECHOCARDIOGRAM (TEE) (N/A) Subjective: No complaints  Objective: Vital signs in last 24 hours: Temp:  [98.4 F (36.9 C)-98.7 F (37.1 C)] 98.7 F (37.1 C) (01/16 0402) Pulse Rate:  [83-103] 102 (01/16 0700) Cardiac Rhythm: Normal sinus rhythm (01/16 0400) Resp:  [8-25] 21 (01/16 0700) BP: (99-129)/(62-89) 104/70 (01/16 0700) SpO2:  [91 %-100 %] 94 % (01/16 0700) Arterial Line BP: (117-135)/(57-74) 117/57 (01/15 1200) Weight:  [97.9 kg (215 lb 14.4 oz)] 97.9 kg (215 lb 14.4 oz) (01/16 0500)  Hemodynamic parameters for last 24 hours:    Intake/Output from previous day: 01/15 0701 - 01/16 0700 In: 1556.9 [P.O.:600; I.V.:956.9] Out: 2510 [Urine:2470; Chest Tube:40] Intake/Output this shift: No intake/output data recorded.  General appearance: alert and cooperative Neurologic: intact Heart: regular rate and rhythm, S1, S2 normal, no murmur, click, rub or gallop Lungs: clear to auscultation bilaterally Extremities: edema mild Wound: incision ok  Lab Results: Recent Labs    08/16/17 1636 08/16/17 1656 08/17/17 0350  WBC 14.0*  --  14.8*  HGB 11.7* 11.9* 11.1*  HCT 36.2* 35.0* 33.9*  PLT 150  --  145*   BMET:  Recent Labs    08/16/17 0359  08/16/17 1656 08/17/17 0350  NA 136  --  135 135  K 4.0  --  4.0 4.2  CL 108  --  100* 103  CO2 21*  --   --  24  GLUCOSE 132*  --  182* 136*  BUN 17  --  21* 20  CREATININE 0.78   < > 0.80 0.88  CALCIUM 7.8*  --   --  8.4*   < > = values in this interval not displayed.    PT/INR:  Recent Labs    08/15/17 1325  LABPROT 16.7*  INR 1.36   ABG    Component Value Date/Time   PHART 7.281 (L) 08/15/2017 1848   HCO3 21.1 08/15/2017 1848   TCO2 25 08/16/2017 1656   ACIDBASEDEF 6.0 (H) 08/15/2017 1848   O2SAT 88.0 08/15/2017 1848   CBG (last 3)  Recent Labs    08/16/17 1953 08/16/17 2350  08/17/17 0402  GLUCAP 131* 124* 117*   CXR: clear  Assessment/Plan: S/P Procedure(s) (LRB): ASCENDING AORTIC ROOT REPLACEMENT, (VALVE SPARING ROOT REPLACEMENT) (N/A) TRANSESOPHAGEAL ECHOCARDIOGRAM (TEE) (N/A)  POD 2 Hemodynamically stable in sinus rhythm with mild resting tachycardia. Will increase Lopressor to 25 bid.  Volume excess: diuresed well yesterday but weight still 9 lbs over his baseline. Continue oral lasix and KCL.  DC sleeve and foley  Continue IS and ambulation  Transfer to 4E.   LOS: 2 days    [name] 08/17/2017

## 2017-08-18 LAB — GLUCOSE, CAPILLARY: GLUCOSE-CAPILLARY: 132 mg/dL — AB (ref 65–99)

## 2017-08-18 MED ORDER — METOLAZONE 5 MG PO TABS
5.0000 mg | ORAL_TABLET | Freq: Once | ORAL | Status: AC
  Administered 2017-08-18: 5 mg via ORAL
  Filled 2017-08-18: qty 1

## 2017-08-18 MED ORDER — METOPROLOL TARTRATE 25 MG PO TABS
37.5000 mg | ORAL_TABLET | Freq: Two times a day (BID) | ORAL | Status: DC
  Administered 2017-08-18 – 2017-08-21 (×7): 37.5 mg via ORAL
  Filled 2017-08-18 (×8): qty 1

## 2017-08-18 MED ORDER — POTASSIUM CHLORIDE CRYS ER 20 MEQ PO TBCR
20.0000 meq | EXTENDED_RELEASE_TABLET | Freq: Three times a day (TID) | ORAL | Status: AC
  Administered 2017-08-18 (×2): 20 meq via ORAL
  Filled 2017-08-18 (×2): qty 1

## 2017-08-18 NOTE — Progress Notes (Addendum)
      [address]       [place] [address]             [phone]        3 Days Post-Op Procedure(s) (LRB): ASCENDING AORTIC ROOT REPLACEMENT, (VALVE SPARING ROOT REPLACEMENT) (N/A) TRANSESOPHAGEAL ECHOCARDIOGRAM (TEE) (N/A)  Subjective: Patient without specific complaints this am. Has already had a bowel movement.  Objective: Vital signs in last 24 hours: Temp:  [98.3 F (36.8 C)-99.5 F (37.5 C)] 99.5 F (37.5 C) (01/17 0433) Pulse Rate:  [97-119] 113 (01/17 0433) Cardiac Rhythm: Normal sinus rhythm;Sinus tachycardia (01/16 2001) Resp:  [16-23] 23 (01/17 0433) BP: (96-125)/(68-79) 106/79 (01/17 0433) SpO2:  [94 %-97 %] 97 % (01/17 0433) Weight:  [213 lb 1.6 oz (96.7 kg)] 213 lb 1.6 oz (96.7 kg) (01/17 0433)  Pre op weight 93.4 kg Current Weight  08/18/17 213 lb 1.6 oz (96.7 kg)       Intake/Output from previous day: 01/16 0701 - 01/17 0700 In: 170.7 [I.V.:170.7] Out: 2620 [Urine:2620]   Physical Exam:  Cardiovascular: Tachycardia Pulmonary: Slightly diminished at bases Abdomen: Soft, non tender, bowel sounds present. Extremities: Mild bilateral lower extremity edema. Wounds: Dressing removed. Slight bloody ooze proximally as small eschar came off with dressing.   No erythema or signs of infection.  Lab Results: CBC: Recent Labs    08/16/17 1636 08/16/17 1656 08/17/17 0350  WBC 14.0*  --  14.8*  HGB 11.7* 11.9* 11.1*  HCT 36.2* 35.0* 33.9*  PLT 150  --  145*   BMET:  Recent Labs    08/16/17 0359  08/16/17 1656 08/17/17 0350  NA 136  --  135 135  K 4.0  --  4.0 4.2  CL 108  --  100* 103  CO2 21*  --   --  24  GLUCOSE 132*  --  182* 136*  BUN 17  --  21* 20  CREATININE 0.78   < > 0.80 0.88  CALCIUM 7.8*  --   --  8.4*   < > = values in this interval not displayed.    PT/INR:  Lab Results  Component Value Date   INR 1.36 08/15/2017   INR 0.97 08/12/2017   INR 1.00 10/03/2013   ABG:  INR: Will add last result for  INR, ABG once components are confirmed Will add last 4 CBG results once components are confirmed  Assessment/Plan:  1. CV - ST at times;otherwise, SR. On Lopressor 25 mg bid. Will increase to 37.5 mg bid for better HR control. 2.  Pulmonary - On room air. Check PA/LAT CXR in am. Encourage incentive spirometer. 3. Volume Overload - On Lasix 40 mg daily 4.  Acute blood loss anemia - H and H stable at 11.1 and 33.9 5. Mild thrombocytopenia-platelets 145,000. 6. CBGs 147/147/132. Pre op HGA1C 5.8. Stop accu checks and SS PRN 7. Remove EPW 8. Possibly home 1-2 days  Donielle M ZimmermanPA-C 08/18/2017,7:12 AM   Chart reviewed, patient examined, agree with above. He feels well and is ambulating without difficulty. Rhythm is sinus tach 100-105. Lopressor increased. He diuresed well last night with lasix and metolazone so I gave him another dose this am. He could possibly go home tomorrow if no changes.

## 2017-08-18 NOTE — Discharge Summary (Signed)
Physician Discharge Summary  Patient ID: Lawrence Liu MRN: [id number] DOB/AGE: 01-Nov-1955 62 y.o.  Admit date: 08/15/2017 Discharge date: 08/21/2017  Admission Diagnoses: Aortic root aneurysm  Discharge Diagnoses:  Active Problems:   S/P aortic aneurysm repair   Patient Active Problem List   Diagnosis Date Noted  . S/P aortic aneurysm repair 08/15/2017  . Essential hypertension 10/01/2016  . Aortic root dilatation ([facility]) 11/06/2013  . 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 PVC's (premature ventricular contractions) 10/02/2013  . History of DVT (deep vein thrombosis) 10/02/2013  . GERD (gastroesophageal reflux disease)   . Arthritis   . Deep vein thrombosis ([facility])    HPI:   The patient is a 62 year old gentleman who I have been following since initially seeing him on 10/06/2016 when a  CTA of the chest on 06/22/2016 showed stable aneurysmal dilation of the aortic root but the radiologist thought that the diameter at the sinus level may be as large as 5.5 cm. We decided to do a gated cardiac CTA this time to get a more accurate measurement at the root level. This showed the diameter at the sinus level to be 49 x 47 x 43 mm with a maximum diameter across the sinuses of 55 mm and was felt to be unchanged from his previous study of 06/22/2016. His last echo on 10/18/2016 showed a trileaflet aortic valve with no stenosis or regurgitation. The aortic root was measured at 50 mm. He had a gated cardiac CT on 04/27/2017 which shows the diameter at the sinus level to be 49 x 47 x 43 mm with a maximum diameter across the sinuses of 55 mm and was felt to be unchanged from his previous study of 06/22/2016. He has been feeling well with no chest or back pain.   Discharged Condition: good  Hospital Course: The patient was admitted electively and on 08/15/2017 he was taken to the operating room where he underwent the below described procedure.  He tolerated it well and was taken to the surgical intensive  care unit in stable condition.  Post operative hospital course:  Patient is overall done quite well.  He was extubated on the evening of surgery without difficulty using standard protocols.  He has remained neurologically intact.  He initially required Neo-Synephrine support but this was weaned without difficulty.  He does have a postoperative volume overload but is responding well to diuretics.  He has a expected acute blood loss anemia and values are stabilized.  Most recent hemoglobin and hematocrit are 11.1/33.9. Renal function is within normal limits.  The patient developed rapid atrial fibrillation.  He was treated with IV Amiodarone with successful conversion to NSR.  He has been transitioned to oral Amiodarone and his beta-blocker has been uptitrated for better heart rate control.  He is noted to have a mild thrombocytopenia.  Most recent platelet count is 140 K.  The patient has been hypokalemic despite continued supplementation.  His most recent level is 3.0.  He will be discharged home on 40 meq daily and will require follow up with his PCP.  The patient is tolerating gradually increasing activities using standard cardiac rehab protocols.  His incision is noted to be healing well without signs of infection.  Oxygen has been weaned and he maintains good saturations on room air.  At time of discharge the patient is felt to be quite stable.   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 Median Sternotomy 2. Extracorporeal circulation 3.   Valve-sparing aortic root replacement with reimplantation of the native aortic valve and left and right coronary arteries ( 32 mm Gelweave valsalva graft).  Anesthesia:  General  Endotracheal  Disposition: 01-Home or Self Care   The patient has been discharged on:   1.Beta Blocker:  Yes [ x  ]                              No   [   ]                              If No, reason:  2.Ace Inhibitor/ARB: Yes [   ]                                     No  [  x  ]                                     If No, reason: labile BP 3.Statin:   Yes [ x  ]                  No  [   ]                  If No, reason:  4.Ecasa:  Yes  [ x  ]                  No   [   ]                  If No, reason:      Allergies as of 08/21/2017      Reactions   Latex Anaphylaxis   Penicillins Other (See Comments)   Causes hives when combined with sulfa, CAN take penicillin by itself   Sulfa Antibiotics Other (See Comments)   Hives...only when mixed sulfa and pencillin together      Medication List    STOP taking these medications   amLODipine-benazepril 5-20 MG capsule Commonly known as:  LOTREL   metoprolol succinate 50 MG 24 hr tablet Commonly known as:  TOPROL-XL     TAKE these medications   acetaminophen 500 MG tablet Commonly known as:  TYLENOL Take 500 mg by mouth every 6 (six) hours as needed for moderate pain or headache.   amiodarone 400 MG tablet Commonly known as:  PACERONE Take 1 tablet (400 mg total) by mouth 2 (two) times daily.   aspirin 325 MG EC tablet Take 1 tablet (325 mg total) by mouth daily. Start taking on:  08/22/2017 What changed:    medication strength  how much to take   atorvastatin 20 MG tablet Commonly known as:  LIPITOR Take 1 tablet (20 mg total) by mouth daily.   CIALIS 20 MG tablet Generic drug:  tadalafil TAKE 1 TABLET BY MOUTH DAILY AS NEEDED FOR ERECTILE DYSFUNCTION.   Co Q-10 300 MG Caps Take 300 mg by mouth daily.  fexofenadine 180 MG tablet Commonly known as:  ALLEGRA Take 180 mg by mouth daily.   GLUCOSAMINE 1500 COMPLEX PO Take 1 tablet by mouth daily.   Metoprolol Tartrate 37.5 MG Tabs Take 37.5 mg by  mouth 2 (two) times daily.   Potassium Chloride ER 20 MEQ Tbcr Take 40 mEq by mouth daily.   PROBIOTIC PO Take 1 tablet by mouth daily. PROBIOTICS 10   ranitidine 150 MG capsule Commonly known as:  ZANTAC Take 300 mg by mouth daily.   traMADol 50 MG tablet Commonly known as:  ULTRAM Take 1 tablet (50 mg total) by mouth every 4 (four) hours as needed for moderate pain.   Vitamin B-12 5000 MCG Tbdp Take 5,000 mcg by mouth daily.   vitamin C 1000 MG tablet Take 1,000 mg by mouth daily.   Vitamin D3 5000 units Caps Take 5,000 Units by mouth daily.   vitamin E 400 UNIT capsule Take 800 Units by mouth daily.      Follow-up Information    Leonie Man, MD Follow up.   Specialty:  Cardiology Why:  He has an appointment to see Dr. Ellyn Hack on 09/08/2017 at 4:20 PM. Contact information: [address] [place] [address] [phone]        [name], MD Follow up.   Specialty:  Cardiothoracic Surgery Why:  He has an appointment to see Dr. Cyndia Bent on 09/28/2017 at 1 PM.  Please obtain a chest x-ray at [place] at 12:30 PM.  [facility] imaging is located in the same office complex on the first floor.  Please bring her current medication list. Contact information: [address] [place] [address] [phone]        [name], MD. Call.   Specialty:  Family Medicine Why:  for a follow up appointment regarding further surveillance of HGA1C 5.8 (pre diabetes) and low potassium level Contact information: [address] [place] [address] [phone]        [facility] Follow up on 08/26/2017.   Specialty:  Cardiothoracic Surgery Why:  Appointment is at 10:00 for suture removal Contact information: [address] [place], [place] [phone]          Signed: Ellwood Handler 08/21/2017, 10:12 AM

## 2017-08-18 NOTE — Progress Notes (Signed)
Patient education completed for removal of pacer wires, patient verbalized understanding, 4 pacer wires removed a ordered, patient now on bedrest , will continue to monitor.

## 2017-08-18 NOTE — Progress Notes (Signed)
Pt sts he has ambulated independently the whole unit (800+ ft) x6 today. HR ranges up to 110 ST. I discussed sternal precautions, IS, ex gl, and CRPII. Pt voiced understanding. He is not interested in CRPII but will ex on his own. Pt walking independently and we will sign off. [id number] Yves Dill CES, ACSM 1:49 PM 08/18/2017

## 2017-08-18 NOTE — Plan of Care (Signed)
  Progressing Education: Knowledge of General Education information will improve 08/18/2017 1440 - Progressing by Renee Rival, RN Health Behavior/Discharge Planning: Ability to manage health-related needs will improve 08/18/2017 1440 - Progressing by Renee Rival, RN Clinical Measurements: Ability to maintain clinical measurements within normal limits will improve 08/18/2017 1440 - Progressing by Renee Rival, RN Will remain free from infection 08/18/2017 1440 - Progressing by Renee Rival, RN Diagnostic test results will improve 08/18/2017 1440 - Progressing by Renee Rival, RN Respiratory complications will improve 08/18/2017 1440 - Progressing by Renee Rival, RN Cardiovascular complication will be avoided 08/18/2017 1440 - Progressing by Renee Rival, RN Activity: Risk for activity intolerance will decrease 08/18/2017 1440 - Progressing by Renee Rival, RN Nutrition: Adequate nutrition will be maintained 08/18/2017 1440 - Progressing by Renee Rival, RN Coping: Level of anxiety will decrease 08/18/2017 1440 - Progressing by Renee Rival, RN Elimination: Will not experience complications related to bowel motility 08/18/2017 1440 - Progressing by Renee Rival, RN Will not experience complications related to urinary retention 08/18/2017 1440 - Progressing by Renee Rival, RN Pain Managment: General experience of comfort will improve 08/18/2017 1440 - Progressing by Renee Rival, RN Safety: Ability to remain free from injury will improve 08/18/2017 1440 - Progressing by Renee Rival, RN Skin Integrity: Risk for impaired skin integrity will decrease 08/18/2017 1440 - Progressing by Renee Rival, RN Education: Ability to demonstrate proper wound care will improve 08/18/2017 1440 - Progressing by Renee Rival, RN Knowledge of disease or condition will improve 08/18/2017 1440 -  Progressing by Renee Rival, RN Knowledge of the prescribed therapeutic regimen will improve 08/18/2017 1440 - Progressing by Renee Rival, RN Activity: Risk for activity intolerance will decrease 08/18/2017 1440 - Progressing by Renee Rival, RN Cardiac: Hemodynamic stability will improve 08/18/2017 1440 - Progressing by Renee Rival, RN Clinical Measurements: Postoperative complications will be avoided or minimized 08/18/2017 1440 - Progressing by Renee Rival, RN Respiratory: Respiratory status will improve 08/18/2017 1440 - Progressing by Renee Rival, RN Skin Integrity: Wound healing without signs and symptoms of infection 08/18/2017 1440 - Progressing by Renee Rival, RN Risk for impaired skin integrity will decrease 08/18/2017 1440 - Progressing by Renee Rival, RN Urinary Elimination: Ability to achieve and maintain adequate renal perfusion and functioning will improve 08/18/2017 1440 - Progressing by Renee Rival, RN

## 2017-08-18 NOTE — Discharge Instructions (Signed)
Prediabetes Eating Plan Prediabetes--also called impaired glucose tolerance or impaired fasting glucose--is a condition that causes blood sugar (blood glucose) levels to be higher than normal. Following a healthy diet can help to keep prediabetes under control. It can also help to lower the risk of type 2 diabetes and heart disease, which are increased in people who have prediabetes. Along with regular exercise, a healthy diet:  Promotes weight loss.  Helps to control blood sugar levels.  Helps to improve the way that the body uses insulin.  What do I need to know about this eating plan?  Use the glycemic index (GI) to plan your meals. The index tells you how quickly a food will raise your blood sugar. Choose low-GI foods. These foods take a longer time to raise blood sugar.  Pay close attention to the amount of carbohydrates in the food that you eat. Carbohydrates increase blood sugar levels.  Keep track of how many calories you take in. Eating the right amount of calories will help you to achieve a healthy weight. Losing about 7 percent of your starting weight can help to prevent type 2 diabetes.  You may want to follow a Mediterranean diet. This diet includes a lot of vegetables, lean meats or fish, whole grains, fruits, and healthy oils and fats. What foods can I eat? Grains Whole grains, such as whole-wheat or whole-grain breads, crackers, cereals, and pasta. Unsweetened oatmeal. Bulgur. Barley. Quinoa. Brown rice. Corn or whole-wheat flour tortillas or taco shells. Vegetables Lettuce. Spinach. Peas. Beets. Cauliflower. Cabbage. Broccoli. Carrots. Tomatoes. Squash. Eggplant. Herbs. Peppers. Onions. Cucumbers. Brussels sprouts. Fruits Berries. Bananas. Apples. Oranges. Grapes. Papaya. Mango. Pomegranate. Kiwi. Grapefruit. Cherries. Meats and Other Protein Sources Seafood. Lean meats, such as chicken and [place] or lean cuts of pork and beef. Tofu. Eggs. Nuts. Beans. Dairy Low-fat or  fat-free dairy products, such as yogurt, cottage cheese, and cheese. Beverages Water. Tea. Coffee. Sugar-free or diet soda. Seltzer water. Milk. Milk alternatives, such as soy or almond milk. Condiments Mustard. Relish. Low-fat, low-sugar ketchup. Low-fat, low-sugar barbecue sauce. Low-fat or fat-free mayonnaise. Sweets and Desserts Sugar-free or low-fat pudding. Sugar-free or low-fat ice cream and other frozen treats. Fats and Oils Avocado. Walnuts. Olive oil. The items listed above may not be a complete list of recommended foods or beverages. Contact your dietitian for more options. What foods are not recommended? Grains Refined white flour and flour products, such as bread, pasta, snack foods, and cereals. Beverages Sweetened drinks, such as sweet iced tea and soda. Sweets and Desserts Baked goods, such as cake, cupcakes, pastries, cookies, and cheesecake. The items listed above may not be a complete list of foods and beverages to avoid. Contact your dietitian for more information. This information is not intended to replace advice given to you by your health care provider. Make sure you discuss any questions you have with your health care provider. Document Released: 12/03/2014 Document Revised: 12/25/2015 Document Reviewed: 08/14/2014 Elsevier Interactive Patient Education  2017 [employer].   Aortic Valve Replacement, Care After Refer to this sheet in the next few weeks. These instructions provide you with information about caring for yourself after your procedure. Your health care provider may also give you more specific instructions. Your treatment has been planned according to current medical practices, but problems sometimes occur. Call your health care provider if you have any problems or questions after your procedure. What can I expect after the procedure? After the procedure, it is common to have:  Pain around your  incision area.  A small amount of blood or clear fluid  coming from your incision.  Follow these instructions at home: Eating and drinking   Follow instructions from your health care provider about eating or drinking restrictions. ? Limit alcohol intake to no more than 1 drink per day for nonpregnant women and 2 drinks per day for men. One drink equals 12 oz of beer, 5 oz of wine, or 1 oz of hard liquor. ? Limit how much caffeine you drink. Caffeine can affect your heart's rate and rhythm.  Drink enough fluid to keep your urine clear or pale yellow.  Eat a heart-healthy diet. This should include plenty of fresh fruits and vegetables. If you eat meat, it should be lean cuts. Avoid foods that are: ? High in salt, saturated fat, or sugar. ? Canned or highly processed. ? Fried. Activity  Return to your normal activities as told by your health care provider. Ask your health care provider what activities are safe for you.  Exercise regularly once you have recovered, as told by your health care provider.  Avoid sitting for more than 2 hours at a time without moving. Get up and move around at least once every 1-2 hours. This helps to prevent blood clots in the legs.  Do not lift anything that is heavier than 10 lb (4.5 kg) until your health care provider approves.  Avoid pushing or pulling things with your arms until your health care provider approves. This includes pulling on handrails to help you climb stairs. Incision care   Follow instructions from your health care provider about how to take care of your incision. Make sure you: ? Wash your hands with soap and water before you change your bandage (dressing). If soap and water are not available, use hand sanitizer. ? Change your dressing as told by your health care provider. ? Leave stitches (sutures), skin glue, or adhesive strips in place. These skin closures may need to stay in place for 2 weeks or longer. If adhesive strip edges start to loosen and curl up, you may trim the loose edges.  Do not remove adhesive strips completely unless your health care provider tells you to do that.  Check your incision area every day for signs of infection. Check for: ? More redness, swelling, or pain. ? More fluid or blood. ? Warmth. ? Pus or a bad smell. Medicines  Take over-the-counter and prescription medicines only as told by your health care provider.  If you were prescribed an antibiotic medicine, take it as told by your health care provider. Do not stop taking the antibiotic even if you start to feel better. Travel  Avoid airplane travel for as long as told by your health care provider.  When you travel, bring a list of your medicines and a record of your medical history with you. Carry your medicines with you. Driving  Ask your health care provider when it is safe for you to drive. Do not drive until your health care provider approves.  Do not drive or operate heavy machinery while taking prescription pain medicine. Lifestyle   Do not use any tobacco products, such as cigarettes, chewing tobacco, or e-cigarettes. If you need help quitting, ask your health care provider.  Resume sexual activity as told by your health care provider. Do not use medicines for erectile dysfunction unless your health care provider approves, if this applies.  Work with your health care provider to keep your blood pressure and cholesterol under control,  and to manage any other heart conditions that you have.  Maintain a healthy weight. General instructions  Do not take baths, swim, or use a hot tub until your health care provider approves.  Do not strain to have a bowel movement.  Avoid crossing your legs while sitting down.  Check your temperature every day for a fever. A fever may be a sign of infection.  If you are a woman and you plan to become pregnant, talk with your health care provider before you become pregnant.  Wear compression stockings if your health care provider instructs  you to do this. These stockings help to prevent blood clots and reduce swelling in your legs.  Tell all health care providers who care for you that you have an artificial (prosthetic) aortic valve. If you have or have had heart disease or endocarditis, tell all health care providers about these conditions as well.  Keep all follow-up visits as told by your health care provider. This is important. Contact a health care provider if:  You develop a skin rash.  You experience sudden, unexplained changes in your weight.  You have more redness, swelling, or pain around your incision.  You have more fluid or blood coming from your incision.  Your incision feels warm to the touch.  You have pus or a bad smell coming from your incision.  You have a fever. Get help right away if:  You develop chest pain that is different from the pain coming from your incision.  You develop shortness of breath or difficulty breathing.  You start to feel light-headed. These symptoms may represent a serious problem that is an emergency. Do not wait to see if the symptoms will go away. Get medical help right away. Call your local emergency services (911 in the [place] Do not drive yourself to the hospital. This information is not intended to replace advice given to you by your health care provider. Make sure you discuss any questions you have with your health care provider. Document Released: 02/04/2005 Document Revised: 12/25/2015 Document Reviewed: 06/22/2015 Elsevier Interactive Patient Education  2017 [employer].

## 2017-08-19 ENCOUNTER — Inpatient Hospital Stay (HOSPITAL_COMMUNITY): Payer: 59

## 2017-08-19 LAB — BASIC METABOLIC PANEL
Anion gap: 15 (ref 5–15)
BUN: 14 mg/dL (ref 6–20)
CHLORIDE: 90 mmol/L — AB (ref 101–111)
CO2: 27 mmol/L (ref 22–32)
CREATININE: 1.09 mg/dL (ref 0.61–1.24)
Calcium: 8.6 mg/dL — ABNORMAL LOW (ref 8.9–10.3)
GFR calc Af Amer: >60 mL/min (ref >60)
GFR calc non Af Amer: >60 mL/min (ref >60)
GLUCOSE: 118 mg/dL — AB (ref 65–99)
Potassium: 3 mmol/L — ABNORMAL LOW (ref 3.5–5.1)
Sodium: 132 mmol/L — ABNORMAL LOW (ref 135–145)

## 2017-08-19 LAB — POTASSIUM: Potassium: 2.8 mmol/L — ABNORMAL LOW (ref 3.5–5.1)

## 2017-08-19 MED ORDER — POTASSIUM CHLORIDE CRYS ER 10 MEQ PO TBCR
40.0000 meq | EXTENDED_RELEASE_TABLET | Freq: Once | ORAL | Status: AC
  Administered 2017-08-19: 40 meq via ORAL
  Filled 2017-08-19: qty 4

## 2017-08-19 MED ORDER — POTASSIUM CHLORIDE CRYS ER 20 MEQ PO TBCR
40.0000 meq | EXTENDED_RELEASE_TABLET | Freq: Once | ORAL | Status: AC
  Administered 2017-08-19: 40 meq via ORAL
  Filled 2017-08-19: qty 2

## 2017-08-19 MED ORDER — POTASSIUM CHLORIDE CRYS ER 20 MEQ PO TBCR
30.0000 meq | EXTENDED_RELEASE_TABLET | Freq: Once | ORAL | Status: AC
  Administered 2017-08-19: 30 meq via ORAL
  Filled 2017-08-19: qty 1

## 2017-08-19 MED ORDER — AMIODARONE HCL 200 MG PO TABS
400.0000 mg | ORAL_TABLET | Freq: Two times a day (BID) | ORAL | Status: DC
  Administered 2017-08-20 – 2017-08-21 (×4): 400 mg via ORAL
  Filled 2017-08-19 (×4): qty 2

## 2017-08-19 MED ORDER — AMIODARONE HCL IN DEXTROSE 360-4.14 MG/200ML-% IV SOLN
30.0000 mg/h | INTRAVENOUS | Status: DC
  Administered 2017-08-19 (×2): 30 mg/h via INTRAVENOUS
  Filled 2017-08-19: qty 200

## 2017-08-19 MED ORDER — AMIODARONE HCL IN DEXTROSE 360-4.14 MG/200ML-% IV SOLN
INTRAVENOUS | Status: AC
  Filled 2017-08-19: qty 200

## 2017-08-19 MED ORDER — POTASSIUM CHLORIDE CRYS ER 20 MEQ PO TBCR
20.0000 meq | EXTENDED_RELEASE_TABLET | Freq: Once | ORAL | Status: AC
  Administered 2017-08-19: 20 meq via ORAL
  Filled 2017-08-19: qty 1

## 2017-08-19 MED ORDER — AMIODARONE HCL IN DEXTROSE 360-4.14 MG/200ML-% IV SOLN
60.0000 mg/h | INTRAVENOUS | Status: DC
  Administered 2017-08-19 (×2): 60 mg/h via INTRAVENOUS
  Filled 2017-08-19: qty 200

## 2017-08-19 MED ORDER — AMIODARONE LOAD VIA INFUSION
150.0000 mg | Freq: Once | INTRAVENOUS | Status: AC
  Administered 2017-08-19: 150 mg via INTRAVENOUS
  Filled 2017-08-19: qty 83.34

## 2017-08-19 NOTE — Progress Notes (Signed)
      [address]       [place] [address]             [phone]       Paged regarding BMP and potassium of 3.0. 40MEQ given this morning. I will order another 40MEQ for this afternoon and draw another potassium level.   Nicholes Rough, PA-C

## 2017-08-19 NOTE — Progress Notes (Signed)
Tessa, PA paged about potasium level of 2.8, order received for one time dose of  40MEQ of potasium and potasium lab in the morning, patient resting quietly in the room, no sign of distress observed, vital signs are stable will continue to monitor.

## 2017-08-19 NOTE — Progress Notes (Signed)
4 Days Post-Op Procedure(s) (LRB): ASCENDING AORTIC ROOT REPLACEMENT, (VALVE SPARING ROOT REPLACEMENT) (N/A) TRANSESOPHAGEAL ECHOCARDIOGRAM (TEE) (N/A) Subjective:  Went into atrial fib with RVR 150's this morning. Felt hot, knew what was happening. Started on amio and converted to sinus tachycardia. No chest pain or shortness of breath.  Objective: Vital signs in last 24 hours: Temp:  [97.7 F (36.5 C)-100.3 F (37.9 C)] 99.8 F (37.7 C) (01/18 0347) Pulse Rate:  [112-150] 146 (01/18 0702) Cardiac Rhythm: Sinus tachycardia (01/18 0617) Resp:  [15-24] 19 (01/18 0702) BP: (92-117)/(65-84) 117/77 (01/18 0702) SpO2:  [90 %-100 %] 97 % (01/18 0702) Weight:  [93.9 kg (207 lb)-96.6 kg (213 lb 0.2 oz)] 93.9 kg (207 lb) (01/18 0347)  Hemodynamic parameters for last 24 hours:    Intake/Output from previous day: 01/17 0701 - 01/18 0700 In: 960 [P.O.:960] Out: 4300 [Urine:4300] Intake/Output this shift: No intake/output data recorded.  General appearance: alert and cooperative Neurologic: intact Heart: regular rate and rhythm, S1, S2 normal, no murmur, click, rub or gallop Lungs: clear to auscultation bilaterally Extremities: extremities normal, atraumatic, no cyanosis or edema Wound: incision ok  Lab Results: Recent Labs    08/16/17 1636 08/16/17 1656 08/17/17 0350  WBC 14.0*  --  14.8*  HGB 11.7* 11.9* 11.1*  HCT 36.2* 35.0* 33.9*  PLT 150  --  145*   BMET:  Recent Labs    08/16/17 1656 08/17/17 0350  NA 135 135  K 4.0 4.2  CL 100* 103  CO2  --  24  GLUCOSE 182* 136*  BUN 21* 20  CREATININE 0.80 0.88  CALCIUM  --  8.4*    PT/INR: No results for input(s): LABPROT, INR in the last 72 hours. ABG    Component Value Date/Time   PHART 7.281 (L) 08/15/2017 1848   HCO3 21.1 08/15/2017 1848   TCO2 25 08/16/2017 1656   ACIDBASEDEF 6.0 (H) 08/15/2017 1848   O2SAT 88.0 08/15/2017 1848   CBG (last 3)  Recent Labs    08/17/17 2029 08/17/17 2352 08/18/17 0430   GLUCAP 147* 147* 132*    Assessment/Plan: S/P Procedure(s) (LRB): ASCENDING AORTIC ROOT REPLACEMENT, (VALVE SPARING ROOT REPLACEMENT) (N/A) TRANSESOPHAGEAL ECHOCARDIOGRAM (TEE) (N/A)  POD 4 Hemodynamically stable on Lopressor 37.5 bid but still has sinus tach baseline.  Postop atrial fibrillation with RVR. Converted with amio. Will continue IV for now and consider switching to po later today if maintaining sinus rhythm.  Diuresed very well yesterday and weight down to preop. Will stop diuretics. Check BMET this am to be sure K+ level ok. I gave him 40 meq Kdur this am.  Continue ambulation and IS.   LOS: 4 days    Lawrence Liu 08/19/2017

## 2017-08-19 NOTE — Progress Notes (Signed)
Patient refused to ambulate in the hall on this shift, patient educated on the need to continue to ambulate he verbalized understanding, will continue to monitor.

## 2017-08-19 NOTE — Progress Notes (Signed)
  Amiodarone Drug - Drug Interaction Consult Note  Recommendations: Monitor QTc, K+ Monitor Zofran Usage  Amiodarone is metabolized by the cytochrome P450 system and therefore has the potential to cause many drug interactions. Amiodarone has an average plasma half-life of 50 days (range 20 to 100 days).   There is potential for drug interactions to occur several weeks or months after stopping treatment and the onset of drug interactions may be slow after initiating amiodarone.   [x]  Statins: Increased risk of myopathy. Simvastatin- restrict dose to 20mg  daily. Other statins: counsel patients to report any muscle pain or weakness immediately.  []  Anticoagulants: Amiodarone can increase anticoagulant effect. Consider warfarin dose reduction. Patients should be monitored closely and the dose of anticoagulant altered accordingly, remembering that amiodarone levels take several weeks to stabilize.  []  Antiepileptics: Amiodarone can increase plasma concentration of phenytoin, the dose should be reduced. Note that small changes in phenytoin dose can result in large changes in levels. Monitor patient and counsel on signs of toxicity.  [x]  Beta blockers: increased risk of bradycardia, AV block and myocardial depression. Sotalol - avoid concomitant use.  []   Calcium channel blockers (diltiazem and verapamil): increased risk of bradycardia, AV block and myocardial depression.  []   Cyclosporine: Amiodarone increases levels of cyclosporine. Reduced dose of cyclosporine is recommended.  []  Digoxin dose should be halved when amiodarone is started.  [x]  Diuretics: increased risk of cardiotoxicity if hypokalemia occurs.  []  Oral hypoglycemic agents (glyburide, glipizide, glimepiride): increased risk of hypoglycemia. Patient's glucose levels should be monitored closely when initiating amiodarone therapy.   [x]  Drugs that prolong the QT interval:  Torsades de pointes risk may be increased with concurrent  use - avoid if possible.  Monitor QTc, also keep magnesium/potassium WNL if concurrent therapy can't be avoided. Marland Kitchen Antibiotics: e.g. fluoroquinolones, erythromycin. . Antiarrhythmics: e.g. quinidine, procainamide, disopyramide, sotalol. . Antipsychotics: e.g. phenothiazines, haloperidol.  . Lithium, tricyclic antidepressants, and methadone. Thank You,  Narda Bonds  08/19/2017 6:39 AM

## 2017-08-19 NOTE — Progress Notes (Signed)
Patient went into At. Fib. RVR rate 140-160's Patient in bed and could feel it. Skin warm and dry. Resp. Even and unlab. R.N. Aware and Dr.Bartle called and made aware at 06:28 see orders. Patient B.P. 107/84 , Rm Air 92 % applied o2 at 2 L/m N.C. Amiodarone started per orders by R.N. After Amiodarone  Bolus given patient got hot and some nausea. Which resolve on it own shortly after. Patient converted to S.T. 112 @ 07:12 R.N. Aware. See V.S. Flow sheet .

## 2017-08-20 LAB — POTASSIUM: POTASSIUM: 3.1 mmol/L — AB (ref 3.5–5.1)

## 2017-08-20 MED ORDER — POTASSIUM CHLORIDE CRYS ER 20 MEQ PO TBCR
40.0000 meq | EXTENDED_RELEASE_TABLET | Freq: Once | ORAL | Status: AC
  Administered 2017-08-20: 40 meq via ORAL
  Filled 2017-08-20: qty 2

## 2017-08-20 NOTE — Plan of Care (Signed)
  Progressing Education: Knowledge of General Education information will improve 08/20/2017 2343 - Progressing by Blair Promise, RN Health Behavior/Discharge Planning: Ability to manage health-related needs will improve 08/20/2017 2343 - Progressing by Blair Promise, RN Clinical Measurements: Ability to maintain clinical measurements within normal limits will improve 08/20/2017 2343 - Progressing by Blair Promise, RN Will remain free from infection 08/20/2017 2343 - Progressing by Blair Promise, RN Diagnostic test results will improve 08/20/2017 2343 - Progressing by Blair Promise, RN Respiratory complications will improve 08/20/2017 2343 - Progressing by Blair Promise, RN Cardiovascular complication will be avoided 08/20/2017 2343 - Progressing by Blair Promise, RN Activity: Risk for activity intolerance will decrease 08/20/2017 2343 - Progressing by Blair Promise, RN Coping: Level of anxiety will decrease 08/20/2017 2343 - Progressing by Blair Promise, RN Elimination: Will not experience complications related to bowel motility 08/20/2017 2343 - Progressing by Blair Promise, RN Will not experience complications related to urinary retention 08/20/2017 2343 - Progressing by Blair Promise, RN Pain Managment: General experience of comfort will improve 08/20/2017 2343 - Progressing by Blair Promise, RN Safety: Ability to remain free from injury will improve 08/20/2017 2343 - Progressing by Blair Promise, RN Skin Integrity: Risk for impaired skin integrity will decrease 08/20/2017 2343 - Progressing by Blair Promise, RN Education: Ability to demonstrate proper wound care will improve 08/20/2017 2343 - Progressing by Blair Promise, RN Knowledge of disease or condition will improve 08/20/2017 2343 - Progressing by Blair Promise, RN Knowledge of the  prescribed therapeutic regimen will improve 08/20/2017 2343 - Progressing by Blair Promise, RN Activity: Risk for activity intolerance will decrease 08/20/2017 2343 - Progressing by Blair Promise, RN Cardiac: Hemodynamic stability will improve 08/20/2017 2343 - Progressing by Blair Promise, RN Skin Integrity: Wound healing without signs and symptoms of infection 08/20/2017 2343 - Progressing by Blair Promise, RN Risk for impaired skin integrity will decrease 08/20/2017 2343 - Progressing by Blair Promise, RN Cardiac: Ability to achieve and maintain adequate cardiopulmonary perfusion will improve 08/20/2017 2343 - Progressing by Blair Promise, RN Education: Knowledge of disease or condition will improve 08/20/2017 2343 - Progressing by Blair Promise, RN Understanding of medication regimen will improve 08/20/2017 2343 - Progressing by Blair Promise, RN

## 2017-08-20 NOTE — Progress Notes (Addendum)
      [address]       [place] [address]             [phone]      5 Days Post-Op Procedure(s) (LRB): ASCENDING AORTIC ROOT REPLACEMENT, (VALVE SPARING ROOT REPLACEMENT) (N/A) TRANSESOPHAGEAL ECHOCARDIOGRAM (TEE) (N/A)   Subjective:  Patient is feeling much better.  Plans to get up walk in a bit as yesterday he felt too poorly being in A. Fib.  Objective: Vital signs in last 24 hours: Temp:  [97.8 F (36.6 C)-99.8 F (37.7 C)] 97.8 F (36.6 C) (01/19 0328) Pulse Rate:  [88-110] 92 (01/19 0328) Cardiac Rhythm: Sinus tachycardia (01/18 1903) Resp:  [12-29] 25 (01/19 0800) BP: (99-118)/(69-84) 111/84 (01/19 0328) SpO2:  [96 %-99 %] 99 % (01/19 0328) Weight:  [203 lb 4.8 oz (92.2 kg)] 203 lb 4.8 oz (92.2 kg) (01/19 0328)  Intake/Output from previous day: 01/18 0701 - 01/19 0700 In: 693.6 [P.O.:480; I.V.:213.6] Out: 1560 [Urine:1560]  General appearance: alert, cooperative and no distress Heart: regular rate and rhythm and tachy Lungs: clear to auscultation bilaterally Abdomen: soft, non-tender; bowel sounds normal; no masses,  no organomegaly Extremities: edema trace Wound: clean and dry  Lab Results: No results for input(s): WBC, HGB, HCT, PLT in the last 72 hours. BMET:  Recent Labs    08/19/17 0845 08/19/17 1417 08/20/17 0327  NA 132*  --   --   K 3.0* 2.8* 3.1*  CL 90*  --   --   CO2 27  --   --   GLUCOSE 118*  --   --   BUN 14  --   --   CREATININE 1.09  --   --   CALCIUM 8.6*  --   --     PT/INR: No results for input(s): LABPROT, INR in the last 72 hours. ABG    Component Value Date/Time   PHART 7.281 (L) 08/15/2017 1848   HCO3 21.1 08/15/2017 1848   TCO2 25 08/16/2017 1656   ACIDBASEDEF 6.0 (H) 08/15/2017 1848   O2SAT 88.0 08/15/2017 1848   CBG (last 3)  Recent Labs    08/17/17 2029 08/17/17 2352 08/18/17 0430  GLUCAP 147* 147* 132*    Assessment/Plan: S/P Procedure(s) (LRB): ASCENDING AORTIC ROOT REPLACEMENT,  (VALVE SPARING ROOT REPLACEMENT) (N/A) TRANSESOPHAGEAL ECHOCARDIOGRAM (TEE) (N/A)  1. CV-PAF, converted to Sinus Tach with Amiodarone- continue Lopressor at 37.5 mg BID, Amiodarone 400 mg BID 2. Pulm- no acute issues, continue IS 3. Renal- creatinine stable, weight is at baseline, Lasix has been discontinued 4. Hypokalemia- remains low despite supplementation, additional 80 mg ordered today, will repeat BMET in AM 5. Dispo- patient stable, maintaining NSR, Amiodarone drip has been stopped, continue Lopressor and Amiodarone, continue to supplement potassium, if in normal range, possibly ready for d/c tomorrow   LOS: 5 days    Ellwood Handler 08/20/2017 Patient seen and examined, agree with above  Remo Lipps C. Roxan Hockey, MD [facility] [phone]

## 2017-08-21 LAB — BASIC METABOLIC PANEL
ANION GAP: 13 (ref 5–15)
BUN: 19 mg/dL (ref 6–20)
CO2: 29 mmol/L (ref 22–32)
Calcium: 8.7 mg/dL — ABNORMAL LOW (ref 8.9–10.3)
Chloride: 93 mmol/L — ABNORMAL LOW (ref 101–111)
Creatinine, Ser: 1.19 mg/dL (ref 0.61–1.24)
Glucose, Bld: 116 mg/dL — ABNORMAL HIGH (ref 65–99)
Potassium: 3 mmol/L — ABNORMAL LOW (ref 3.5–5.1)
SODIUM: 135 mmol/L (ref 135–145)

## 2017-08-21 MED ORDER — POTASSIUM CHLORIDE CRYS ER 20 MEQ PO TBCR
40.0000 meq | EXTENDED_RELEASE_TABLET | Freq: Once | ORAL | Status: AC
  Administered 2017-08-21: 40 meq via ORAL
  Filled 2017-08-21: qty 2

## 2017-08-21 MED ORDER — METOPROLOL TARTRATE 37.5 MG PO TABS: 37.5000 mg | ORAL_TABLET | Freq: Two times a day (BID) | ORAL | 3 refills | Status: DC

## 2017-08-21 MED ORDER — ASPIRIN 325 MG PO TBEC: 325.0000 mg | DELAYED_RELEASE_TABLET | Freq: Every day | ORAL | 0 refills | Status: DC

## 2017-08-21 MED ORDER — POTASSIUM CHLORIDE ER 20 MEQ PO TBCR: 40.0000 meq | EXTENDED_RELEASE_TABLET | Freq: Every day | ORAL | 1 refills | Status: DC

## 2017-08-21 MED ORDER — AMIODARONE HCL 400 MG PO TABS: 400.0000 mg | ORAL_TABLET | Freq: Two times a day (BID) | ORAL | 1 refills | Status: DC

## 2017-08-21 MED ORDER — TRAMADOL HCL 50 MG PO TABS: 50.0000 mg | ORAL_TABLET | ORAL | 0 refills | Status: DC | PRN

## 2017-08-21 NOTE — Progress Notes (Signed)
Discharge instructions reviewed with patient and wife, questions answered,  Telemetry discontinued, CCMD notified, IV site discontinued.  Patient is an employee of the [facility] health system and uses the outpatient pharmacy, questioned if he could get one days worth of his prescriptions.  Talked with the pharmacist  Here today and they were unable to fill for the patient, encouraged him to seek an outside pharmacy such as [facility] or [facility].   Mervyn Skeeters, RN

## 2017-08-21 NOTE — Progress Notes (Addendum)
      [address]       [place] [address]             [phone]      6 Days Post-Op Procedure(s) (LRB): ASCENDING AORTIC ROOT REPLACEMENT, (VALVE SPARING ROOT REPLACEMENT) (N/A) TRANSESOPHAGEAL ECHOCARDIOGRAM (TEE) (N/A)   Subjective:  Patient feels great.  Ambulating independently.  + BM  Objective: Vital signs in last 24 hours: Temp:  [99.8 F (37.7 C)] 99.8 F (37.7 C) (01/20 0343) Pulse Rate:  [86-107] 86 (01/20 0343) Cardiac Rhythm: Normal sinus rhythm (01/20 0800) Resp:  [17-29] 17 (01/20 0343) BP: (111-113)/(67-79) 111/79 (01/20 0343) Weight:  [204 lb (92.5 kg)] 204 lb (92.5 kg) (01/20 0343)  Intake/Output from previous day: 01/19 0701 - 01/20 0700 In: 120 [P.O.:120] Out: 450 [Urine:450]  General appearance: alert, cooperative and no distress Heart: regular rate and rhythm Lungs: clear to auscultation bilaterally Abdomen: soft, non-tender; bowel sounds normal; no masses,  no organomegaly Extremities: edema trac Wound: clean and dry  Lab Results: No results for input(s): WBC, HGB, HCT, PLT in the last 72 hours. BMET:  Recent Labs    08/19/17 0845  08/20/17 0327 08/21/17 0310  NA 132*  --   --  135  K 3.0*   < > 3.1* 3.0*  CL 90*  --   --  93*  CO2 27  --   --  29  GLUCOSE 118*  --   --  116*  BUN 14  --   --  19  CREATININE 1.09  --   --  1.19  CALCIUM 8.6*  --   --  8.7*   < > = values in this interval not displayed.    PT/INR: No results for input(s): LABPROT, INR in the last 72 hours. ABG    Component Value Date/Time   PHART 7.281 (L) 08/15/2017 1848   HCO3 21.1 08/15/2017 1848   TCO2 25 08/16/2017 1656   ACIDBASEDEF 6.0 (H) 08/15/2017 1848   O2SAT 88.0 08/15/2017 1848   CBG (last 3)  No results for input(s): GLUCAP in the last 72 hours.  Assessment/Plan: S/P Procedure(s) (LRB): ASCENDING AORTIC ROOT REPLACEMENT, (VALVE SPARING ROOT REPLACEMENT) (N/A) TRANSESOPHAGEAL ECHOCARDIOGRAM (TEE) (N/A)  1. CV- PAF,  maintaining NSR, tachycardia improved- continue Amiodarone, Lopressor 2. Pulm- no acute issues, continue IS 3. Renal- creatinine stable at 1.19, not on Lasix, weight is okay 4. Hypokalemia- persistent, currently at 3.0, received 80 meq the past 2 days without a response, will discuss further management with Dr. Roxan Hockey 5. Dispo- patient is stable, maintaining NSR, Hypokalemia persists and is main issues, will discuss further management and discuss discharge with Dr. Roxan Hockey   LOS: 6 days    Ellwood Handler 08/21/2017  Patient seen and examined, agree with above Dc home on 40 mEq of K a day  Remo Lipps C. Roxan Hockey, MD [facility] [phone]

## 2017-08-22 ENCOUNTER — Encounter: Payer: Self-pay | Admitting: *Deleted

## 2017-08-22 ENCOUNTER — Other Ambulatory Visit: Payer: Self-pay | Admitting: *Deleted

## 2017-08-22 NOTE — Telephone Encounter (Signed)
This encounter was created in error - please disregard.

## 2017-08-22 NOTE — Patient Outreach (Addendum)
Lawrence Liu Lawrence Liu Liu) Care Liu  08/22/2017  Lawrence Liu [birth year] [id number]   Subjective: Telephone call to patient's home / mobile number, spoke with patient, and HIPAA verified.  Discussed Lawrence Liu Care Liu UMR Transition of care follow up, patient voiced understanding, and is in agreement to follow up.   Patient placed call on speaker phone, RNCM given verbal consent in the past to speak with wife Lawrence Liu,  Lawrence Liu spoke with patient and wife throughout converstation.  Patient states he is doing much better, remembers speaking with this RNCM in the past, trying to get comfortable in bed because his bed at home is not adjustable like Liu bed, will continue to work through this, remains optimistic regarding recovery, had to stay in the Liu 2 additional days due to atrial fibrillation, has follow up appointment scheduled with primary MD on 08/23/17, follow up with surgeon on 08/26/17 to remove staples, follow up appointment with cardiologist on 09/08/17, and additional follow up with surgeon on 09/28/17.  States he had some trouble taking potassium pil, consulted with Lawrence Liu pharmacist, and received  for strategies to assist with administration of medication.  Patient states he is able to manage self care and has assistance as needed activities of daily living / home Liu as needed. Patient voices understanding of medical diagnosis, surgery, and treatment plan.  Lawrence Liu benefits discussed on 08/10/17 preoperative call and patient he had 1 additional question regarding contact number for itemized Liu request (verbally given contact number for Lawrence Liu [phone]), voiced understanding, and states he will follow up.  Patient states he does not have any education material, transition of care, care coordination, disease Liu, disease monitoring, transportation, community resource, or pharmacy needs at this time.  States he is very appreciative  of the follow up and is in agreement to receive Lawrence Liu information.    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   Status post  ASCENDING AORTIC ROOT REPLACEMENT, (VALVE SPARING ROOT REPLACEMENT)on 08/15/17 at [facility].   Patient also has a history of hypertension.      Assessment: Received UMR Preoperative / Transition of care referral on 08/01/17.Preoperative call completed, and Transition of care follow up completed, no care Liu needs, and will proceed with case closure.       Plan:RNCM will send patient successful outreach letter, [facility] pamphlet, and magnet. RNCM will send case closure due to follow up completed / no care Liu needs request to [facility].     Lawrence Liu H. Annia Friendly, BSN, [place] [facility] Telephonic CM Phone: [phone] Fax: [phone]

## 2017-08-23 ENCOUNTER — Ambulatory Visit (INDEPENDENT_AMBULATORY_CARE_PROVIDER_SITE_OTHER): Payer: 59 | Admitting: Family Medicine

## 2017-08-23 ENCOUNTER — Encounter: Payer: Self-pay | Admitting: Family Medicine

## 2017-08-23 VITALS — BP 98/60 | HR 86 | Temp 98.8°F | Resp 16 | Ht 72.0 in | Wt 208.0 lb

## 2017-08-23 DIAGNOSIS — Z09 Encounter for follow-up examination after completed treatment for conditions other than malignant neoplasm: Secondary | ICD-10-CM

## 2017-08-23 DIAGNOSIS — Z23 Encounter for immunization: Secondary | ICD-10-CM

## 2017-08-23 NOTE — Progress Notes (Signed)
Subjective:    Patient ID: Lawrence Liu, male    DOB: [birth year], 62 y.o.   MRN: [id number]  HPI Patient is a very pleasant 62 year old Caucasian male who is here today for hospital discharge follow-up. Recently underwent a ascending aortic root replacement due to aortic root aneurysm. Postoperative course was complicated by atrial fibrillation. Patient is currently on amiodarone for rhythm control and is in normal sinus rhythm today on exam. He denies any palpitations, syncope, presyncope, or tachycardia. He is on a full aspirin but is not taking any anticoagulants at the present time. Postoperative course was also complicated by mild thrombocytopenia with platelet count averaging between 130 and 140 along with postoperative hypokalemia. He is currently taking 40 mEq a day of potassium. He denies any cramps or palpitations. He is due to recheck a CBC to monitor his platelet count as well as a BMP to monitor his potassium. He has no history of hyperkalemia or thrombocytopenia prior to his operation. He did receive aggressive diuresis postoperatively due to fluid retention which perhaps contributed to the hypokalemia. Past Medical History:  Diagnosis Date  . Alpha galactosidase deficiency    multiple tick bites  . Aortic root dilatation ([place]) 11/06/2013   CTA of chest November 2016: aorta at sinus of Valsalva/sinotubular junction/ascending aorta are 5.2 cm, 4.0 cm, 3.7 cm -> little change from prior study  . Deep vein thrombosis ([place]) 2004   Left deep vein thrombosis of left calf  . GERD (gastroesophageal reflux disease)   . History of blood transfusion    as a baby for jaundice  . Hypertension   . 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 No lesions greater than 30% noted.  . Pneumonia 1980's   walking pneumonia   Past Surgical History:  Procedure Laterality Date  . ASCENDING AORTIC ROOT REPLACEMENT N/A 08/15/2017   Procedure: ASCENDING AORTIC ROOT  REPLACEMENT, (VALVE SPARING ROOT REPLACEMENT);  Surgeon: [name], MD;  Location: [facility];  Service: Open Heart Surgery;  Laterality: N/A;  . COLONOSCOPY WITH PROPOFOL N/A 11/23/2016   Procedure: COLONOSCOPY WITH PROPOFOL;  Surgeon: Juanita Craver, MD;  Location: [facility];  Service: Endoscopy;  Laterality: N/A;  . colonscopy  5 1/2 yrs ago   polyps removed  . EYE SURGERY Bilateral   . LEFT HEART CATHETERIZATION WITH CORONARY ANGIOGRAM N/A 10/03/2013   Procedure: LEFT HEART CATHETERIZATION WITH CORONARY ANGIOGRAM;  Surgeon: Jolaine Artist, MD;  Location: [facility] C[facility] LAB;  Service: Cardiovascular:  Nonobstructive disease - LAD diffuse 20-30%. Small distal vessel. Circumflex ostial 30-40%. Large OM 1 with 30% mid. Small OM 2 and several small PL branches. Large dominant RCA with 30% mid and diffuse 20-30% distal.  . SHOULDER ARTHROSCOPY  07/15/2011   Procedure: ARTHROSCOPY SHOULDER;  Surgeon: Metta Clines Supple;  Location: [facility];  Service: Orthopedics;  Laterality: Right;  RIGHT ARTHROSCOPY SUBACROMIAL DECOMPRESSION AND DISTAL CLAVICLE RESECTION  . TEE WITHOUT CARDIOVERSION N/A 08/15/2017   Procedure: TRANSESOPHAGEAL ECHOCARDIOGRAM (TEE);  Surgeon: [name], MD;  Location: [facility];  Service: Open Heart Surgery;  Laterality: N/A;  . TONSILLECTOMY  ~ 1970   adenoids also  . TRANSTHORACIC ECHOCARDIOGRAM  March 2015   Upper limit size LV. Mild LVH. EF 60%. Normal wall motion. Ascending aorta measures 48 mm at root tapering to 40 mm at proximal ascending. Mild RV dilation.   Current Outpatient Medications on File Prior to Visit  Medication Sig Dispense Refill  . acetaminophen (TYLENOL) 500  MG tablet Take 500 mg by mouth every 6 (six) hours as needed for moderate pain or headache.    Marland Kitchen amiodarone (PACERONE) 400 MG tablet Take 1 tablet (400 mg total) by mouth 2 (two) times daily. 60 tablet 1  . Ascorbic Acid (VITAMIN C) 1000 MG tablet Take 1,000 mg by mouth daily.    Marland Kitchen aspirin EC 325 MG EC tablet Take  1 tablet (325 mg total) by mouth daily. 30 tablet 0  . atorvastatin (LIPITOR) 20 MG tablet Take 1 tablet (20 mg total) by mouth daily. 90 tablet 3  . Cholecalciferol (VITAMIN D3) 5000 units CAPS Take 5,000 Units by mouth daily.    . Coenzyme Q10 (CO Q-10) 300 MG CAPS Take 300 mg by mouth daily.    . Cyanocobalamin (VITAMIN B-12) 5000 MCG TBDP Take 5,000 mcg by mouth daily.     . fexofenadine (ALLEGRA) 180 MG tablet Take 180 mg by mouth daily.    . Glucosamine-Chondroit-Vit C-Mn (GLUCOSAMINE 1500 COMPLEX PO) Take 1 tablet by mouth daily.     . Metoprolol Tartrate 37.5 MG TABS Take 37.5 mg by mouth 2 (two) times daily. 60 tablet 3  . potassium chloride 20 MEQ TBCR Take 40 mEq by mouth daily. 60 tablet 1  . Probiotic Product (PROBIOTIC PO) Take 1 tablet by mouth daily. PROBIOTICS 10    . ranitidine (ZANTAC) 150 MG capsule Take 300 mg by mouth daily.     . traMADol (ULTRAM) 50 MG tablet Take 1 tablet (50 mg total) by mouth every 4 (four) hours as needed for moderate pain. 30 tablet 0  . vitamin E 400 UNIT capsule Take 800 Units by mouth daily.     Marland Kitchen CIALIS 20 MG tablet TAKE 1 TABLET BY MOUTH DAILY AS NEEDED FOR ERECTILE DYSFUNCTION. (Patient not taking: Reported on 08/03/2017) 6 tablet PRN   No current facility-administered medications on file prior to visit.    Allergies  Allergen Reactions  . Latex Anaphylaxis  . Penicillins Other (See Comments)    Causes hives when combined with sulfa, CAN take penicillin by itself  . Sulfa Antibiotics Other (See Comments)    Hives...only when mixed sulfa and pencillin together   Social History   Socioeconomic History  . Marital status: Married    Spouse name: Not on file  . Number of children: Not on file  . Years of education: Not on file  . Highest education level: Not on file  Social Needs  . Financial resource strain: Not on file  . Food insecurity - worry: Not on file  . Food insecurity - inability: Not on file  . Transportation needs -  medical: Not on file  . Transportation needs - non-medical: Not on file  Occupational History  . Not on file  Tobacco Use  . Smoking status: Never Smoker  . Smokeless tobacco: Never Used  Substance and Sexual Activity  . Alcohol use: Yes    Comment: 10/02/2013 "hard apple cider maybe once/month"  . Drug use: No  . Sexual activity: Yes  Other Topics Concern  . Not on file  Social History Narrative  . Not on file      Review of Systems  All other systems reviewed and are negative.      Objective:   Physical Exam  Constitutional: He appears well-developed and well-nourished.  Neck: Neck supple. No JVD present.  Cardiovascular: Normal rate, regular rhythm and normal heart sounds.  Pulmonary/Chest: Effort normal and breath sounds normal. No respiratory  distress. He has no wheezes. He has no rales.  Abdominal: Soft. Bowel sounds are normal. He exhibits no distension. There is no tenderness. There is no rebound and no guarding.  Musculoskeletal: He exhibits no edema.  Lymphadenopathy:    He has no cervical adenopathy.  Vitals reviewed.         Assessment & Plan:  Hospital discharge follow-up - Plan: CBC with Differential/Platelet, COMPLETE METABOLIC PANEL WITH GFR  Surgical scar in the center of the chest appears well-healed. There is minimal bruising around surgical site. There is no evidence of cellulitis. There is no wound dehiscence. Patient has no pitting edema. Lungs are clear to auscultation bilaterally. There is no evidence of DVT. I will check a BMP today to monitor his potassium level. If potassium levels are normal or elevated we will need to discontinue potassium replacement. Persistent hypokalemia is found, consider checking magnesium level. I will also check a CBC to monitor his thrombocytopenia. I treated this to his operation as well as many on Lovenox. Hopefully platelet counts have normalized. There is no evidence of any postoperative complications today on exam.  He has follow-up scheduled with his cardiothoracic surgeon as well as his cardiologist. I will give the patient Pneumovax 23 today in clinic. He has had his flu shot

## 2017-08-23 NOTE — Addendum Note (Signed)
Addended by: Shary Decamp B on: 08/23/2017 04:53 PM   Modules accepted: Orders

## 2017-08-24 LAB — CBC WITH DIFFERENTIAL/PLATELET
Basophils Absolute: 49 {cells}/uL (ref 0–200)
Basophils Relative: 0.4 %
Eosinophils Absolute: 830 {cells}/uL — ABNORMAL HIGH (ref 15–500)
Eosinophils Relative: 6.8 %
HCT: 31 % — ABNORMAL LOW (ref 38.5–50.0)
Hemoglobin: 10.3 g/dL — ABNORMAL LOW (ref 13.2–17.1)
Lymphs Abs: 2172 {cells}/uL (ref 850–3900)
MCH: 26.2 pg — ABNORMAL LOW (ref 27.0–33.0)
MCHC: 33.2 g/dL (ref 32.0–36.0)
MCV: 78.9 fL — ABNORMAL LOW (ref 80.0–100.0)
MPV: 9.4 fL (ref 7.5–12.5)
Monocytes Relative: 9 %
Neutro Abs: 8052 {cells}/uL — ABNORMAL HIGH (ref 1500–7800)
Neutrophils Relative %: 66 %
Platelets: 487 Thousand/uL — ABNORMAL HIGH (ref 140–400)
RBC: 3.93 Million/uL — ABNORMAL LOW (ref 4.20–5.80)
RDW: 12.9 % (ref 11.0–15.0)
Total Lymphocyte: 17.8 %
WBC mixed population: 1098 {cells}/uL — ABNORMAL HIGH (ref 200–950)
WBC: 12.2 Thousand/uL — ABNORMAL HIGH (ref 3.8–10.8)

## 2017-08-24 LAB — COMPLETE METABOLIC PANEL WITH GFR
AG Ratio: 1.1 (calc) (ref 1.0–2.5)
ALT: 80 U/L — ABNORMAL HIGH (ref 9–46)
AST: 48 U/L — AB (ref 10–35)
Albumin: 3 g/dL — ABNORMAL LOW (ref 3.6–5.1)
Alkaline phosphatase (APISO): 139 U/L — ABNORMAL HIGH (ref 40–115)
BUN: 23 mg/dL (ref 7–25)
CHLORIDE: 100 mmol/L (ref 98–110)
CO2: 29 mmol/L (ref 20–32)
CREATININE: 1.08 mg/dL (ref 0.70–1.33)
Calcium: 8.6 mg/dL (ref 8.6–10.3)
GFR, Est African American: 87 mL/min/{1.73_m2} (ref >=60)
GFR, Est Non African American: 75 mL/min/{1.73_m2} (ref >=60)
GLOBULIN: 2.8 g/dL (ref 1.9–3.7)
Glucose, Bld: 106 mg/dL — ABNORMAL HIGH (ref 65–99)
POTASSIUM: 5.1 mmol/L (ref 3.5–5.3)
SODIUM: 137 mmol/L (ref 135–146)
Total Bilirubin: 0.6 mg/dL (ref 0.2–1.2)
Total Protein: 5.8 g/dL — ABNORMAL LOW (ref 6.1–8.1)

## 2017-08-25 ENCOUNTER — Encounter: Payer: Self-pay | Admitting: Family Medicine

## 2017-08-25 ENCOUNTER — Other Ambulatory Visit: Payer: Self-pay | Admitting: Family Medicine

## 2017-08-25 DIAGNOSIS — R7989 Other specified abnormal findings of blood chemistry: Secondary | ICD-10-CM

## 2017-08-25 DIAGNOSIS — R945 Abnormal results of liver function studies: Secondary | ICD-10-CM

## 2017-08-25 DIAGNOSIS — D508 Other iron deficiency anemias: Secondary | ICD-10-CM

## 2017-08-26 ENCOUNTER — Ambulatory Visit (INDEPENDENT_AMBULATORY_CARE_PROVIDER_SITE_OTHER): Payer: Self-pay

## 2017-08-26 DIAGNOSIS — Z4802 Encounter for removal of sutures: Secondary | ICD-10-CM

## 2017-08-26 NOTE — Progress Notes (Signed)
Removed 2 sutures from chest tube sites with no signs of infection and patient tolerated well. 

## 2017-09-02 ENCOUNTER — Other Ambulatory Visit: Payer: 59

## 2017-09-02 DIAGNOSIS — R7989 Other specified abnormal findings of blood chemistry: Secondary | ICD-10-CM

## 2017-09-02 DIAGNOSIS — R945 Abnormal results of liver function studies: Principal | ICD-10-CM

## 2017-09-02 DIAGNOSIS — D508 Other iron deficiency anemias: Secondary | ICD-10-CM

## 2017-09-02 LAB — COMPREHENSIVE METABOLIC PANEL
AG RATIO: 1.2 (calc) (ref 1.0–2.5)
ALBUMIN MSPROF: 3.4 g/dL — AB (ref 3.6–5.1)
ALT: 26 U/L (ref 9–46)
AST: 11 U/L (ref 10–35)
Alkaline phosphatase (APISO): 90 U/L (ref 40–115)
BUN: 17 mg/dL (ref 7–25)
CHLORIDE: 107 mmol/L (ref 98–110)
CO2: 24 mmol/L (ref 20–32)
Calcium: 9 mg/dL (ref 8.6–10.3)
Creat: 1.05 mg/dL (ref 0.70–1.33)
GLOBULIN: 2.8 g/dL (ref 1.9–3.7)
GLUCOSE: 95 mg/dL (ref 65–99)
Potassium: 4.9 mmol/L (ref 3.5–5.3)
SODIUM: 141 mmol/L (ref 135–146)
TOTAL PROTEIN: 6.2 g/dL (ref 6.1–8.1)
Total Bilirubin: 0.4 mg/dL (ref 0.2–1.2)

## 2017-09-02 LAB — CBC WITH DIFFERENTIAL/PLATELET
BASOS ABS: 52 {cells}/uL (ref 0–200)
Basophils Relative: 0.6 %
EOS ABS: 661 {cells}/uL — AB (ref 15–500)
Eosinophils Relative: 7.6 %
HCT: 33.5 % — ABNORMAL LOW (ref 38.5–50.0)
HEMOGLOBIN: 10.6 g/dL — AB (ref 13.2–17.1)
Lymphs Abs: 1670 cells/uL (ref 850–3900)
MCH: 25.5 pg — AB (ref 27.0–33.0)
MCHC: 31.6 g/dL — AB (ref 32.0–36.0)
MCV: 80.5 fL (ref 80.0–100.0)
MONOS PCT: 5.7 %
MPV: 8.4 fL (ref 7.5–12.5)
Neutro Abs: 5820 cells/uL (ref 1500–7800)
Neutrophils Relative %: 66.9 %
Platelets: 586 10*3/uL — ABNORMAL HIGH (ref 140–400)
RBC: 4.16 10*6/uL — ABNORMAL LOW (ref 4.20–5.80)
RDW: 13.3 % (ref 11.0–15.0)
TOTAL LYMPHOCYTE: 19.2 %
WBC: 8.7 10*3/uL (ref 3.8–10.8)
WBCMIX: 496 {cells}/uL (ref 200–950)

## 2017-09-05 ENCOUNTER — Encounter: Payer: Self-pay | Admitting: Family Medicine

## 2017-09-08 ENCOUNTER — Ambulatory Visit (INDEPENDENT_AMBULATORY_CARE_PROVIDER_SITE_OTHER): Payer: 59 | Admitting: Cardiology

## 2017-09-08 ENCOUNTER — Encounter: Payer: Self-pay | Admitting: Cardiology

## 2017-09-08 VITALS — BP 122/78 | HR 69 | Ht 73.0 in | Wt 203.2 lb

## 2017-09-08 DIAGNOSIS — Z8679 Personal history of other diseases of the circulatory system: Secondary | ICD-10-CM

## 2017-09-08 DIAGNOSIS — I10 Essential (primary) hypertension: Secondary | ICD-10-CM

## 2017-09-08 DIAGNOSIS — Z9889 Other specified postprocedural states: Secondary | ICD-10-CM | POA: Diagnosis not present

## 2017-09-08 DIAGNOSIS — D649 Anemia, unspecified: Secondary | ICD-10-CM

## 2017-09-08 DIAGNOSIS — I9789 Other postprocedural complications and disorders of the circulatory system, not elsewhere classified: Secondary | ICD-10-CM

## 2017-09-08 DIAGNOSIS — E785 Hyperlipidemia, unspecified: Secondary | ICD-10-CM

## 2017-09-08 DIAGNOSIS — I4891 Unspecified atrial fibrillation: Secondary | ICD-10-CM | POA: Diagnosis not present

## 2017-09-08 DIAGNOSIS — I77810 Thoracic aortic ectasia: Secondary | ICD-10-CM | POA: Diagnosis not present

## 2017-09-08 MED ORDER — METOPROLOL SUCCINATE ER 100 MG PO TB24: 100.0000 mg | ORAL_TABLET | Freq: Every day | ORAL | 3 refills | Status: DC

## 2017-09-08 MED ORDER — AMIODARONE HCL 200 MG PO TABS: 200.0000 mg | ORAL_TABLET | Freq: Every day | ORAL | 0 refills | Status: DC

## 2017-09-08 NOTE — Progress Notes (Signed)
PCP: [name], MD  CT surgeon: Dr. Cyndia Bent  Clinic Note: Chief Complaint  Patient presents with  . Hospitalization Follow-up    s/p Ascending Aotic Aneurysm Repair  . Atrial Fibrillation    post-op    HPI: Lawrence Liu is a 62 y.o. male with a PMH below who presents today for hospital follow-up status post aascending aortic aneurysm repair. 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  Feeling great.  He still has some soreness from his postop  [name] was last seen on May 19, 2017 for his last preop evaluation prior to aortic aneurysm repair.  He chose to do this over December, January timeframe based on his work schedule.  Dr. Cyndia Bent decided that he did not need to have cardiac catheterization, simply did coronary CT angiogram. --His blood pressure was well controlled and he was asymptomatic.  His lipid control was excellent on statin.  Recent Hospitalizations:   Jan 14 [facility]) --> discharge January 20, 2 days later than anticipated because he had a run of A. fib on the 18th the converted with amiodarone.  He now remains on amiodarone postop.  Studies Personally Reviewed - (if available, images/films reviewed: From Epic Chart or Care Everywhere)  OR TEE August 15, 2017: No PFO.  Aortic valve trileaflet.  No regurgitation.  Ascending aorta series severely dilated.  Mitral valve normal.  Calcification present.  Trace TR.  Normal RV thickness and function.  Normal LV thickness and function.  Interval History: Lawrence Liu presents here today discomfort, but is not using any pain medications.  He says he is a little bit short of breath if he tries to do things, but has been walking about a mile to mile and a half a day without any difficulty.  He says really he only gets short of breath when he pulled the full garbage can up the hill to the road. He is not had any sensation of recurrent A. fib such as rapid irregular  heartbeats or palpitations.  No significant dyspnea other than that noted.  He has never had any chest tightness pressure with rest or exertion.   No PND, orthopnea or edema. No lightheadedness, dizziness, weakness or syncope/near syncope. No TIA/amaurosis fugax symptoms. No claudication.  ROS:  Review of Systems  Constitutional: Negative for malaise/fatigue (Just a little bit slow recovering.  Still less than 1 month postop).  HENT: Negative for congestion and nosebleeds.   Respiratory: Negative for cough and sputum production.   Cardiovascular: Positive for chest pain (Postop chest wall pain).  Gastrointestinal: Negative for abdominal pain, blood in stool, heartburn, melena and nausea.  Genitourinary: Negative for hematuria.  Musculoskeletal: Negative for joint pain.  Neurological: Negative for dizziness and weakness.    I have reviewed and (if needed) personally updated the patient's problem list, medications, allergies, past medical and surgical history, social and family history.   Past Medical History:  Diagnosis Date  . Alpha galactosidase deficiency    multiple tick bites  . Deep vein thrombosis ([place]) 2004   Left deep vein thrombosis of left calf  . GERD (gastroesophageal reflux disease)   . History of blood transfusion    as a baby for jaundice  . Hypertension   . 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 No lesions greater than 30% noted. - confirmed by Cor CTA  . Pneumonia 1980's   walking pneumonia  . Status  post ascending aortic aneurysm repair 08/2016   Dr. Cyndia Bent  . 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 ASCENDING AORTIC ROOT REPLACEMENT N/A 08/15/2017   Procedure: ASCENDING AORTIC ROOT REPLACEMENT, (VALVE SPARING ROOT REPLACEMENT);  Surgeon:  [name], MD;  Location: [facility];  Service: Open Heart Surgery;  Laterality: N/A;  . COLONOSCOPY WITH PROPOFOL N/A 11/23/2016   Procedure: COLONOSCOPY WITH PROPOFOL;  Surgeon: Juanita Craver, MD;  Location: [facility];  Service: Endoscopy;  Laterality: N/A;  . colonscopy  5 1/2 yrs ago   polyps removed  . EYE SURGERY Bilateral   . LEFT HEART CATHETERIZATION WITH CORONARY ANGIOGRAM N/A 10/03/2013   Procedure: LEFT HEART CATHETERIZATION WITH CORONARY ANGIOGRAM;  Surgeon: Jolaine Artist, MD;  Location: [facility] CATH LAB;  Service: Cardiovascular:  Nonobstructive disease - LAD diffuse 20-30%. Small distal vessel. Circumflex ostial 30-40%. Large OM 1 with 30% mid. Small OM 2 and several small PL branches. Large dominant RCA with 30% mid and diffuse 20-30% distal.  . SHOULDER ARTHROSCOPY  07/15/2011   Procedure: ARTHROSCOPY SHOULDER;  Surgeon: Metta Clines Supple;  Location: [place];  Service: Orthopedics;  Laterality: Right;  RIGHT ARTHROSCOPY SUBACROMIAL DECOMPRESSION AND DISTAL CLAVICLE RESECTION  . TEE WITHOUT CARDIOVERSION N/A 08/15/2017   Procedure: TRANSESOPHAGEAL ECHOCARDIOGRAM (TEE);  Surgeon: [name], MD;  Location: [place];  Service: Open Heart Surgery;  Laterality: N/A;  . TONSILLECTOMY  ~ 1970   adenoids also  . TRANSTHORACIC ECHOCARDIOGRAM  March 2015   Upper limit size LV. Mild LVH. EF 60%. Normal wall motion. Ascending aorta measures 48 mm at root tapering to 40 mm at proximal ascending. Mild RV dilation.    Current Meds  Medication Sig  . acetaminophen (TYLENOL) 500 MG tablet Take 500 mg by mouth every 6 (six) hours as needed for moderate pain or headache.  . Ascorbic Acid (VITAMIN C) 1000 MG tablet Take 1,000 mg by mouth daily.  Marland Kitchen aspirin EC 325 MG EC tablet Take 1 tablet (325 mg total) by mouth daily.  Marland Kitchen atorvastatin (LIPITOR) 20 MG tablet Take 1 tablet (20 mg total) by mouth daily.  . Cholecalciferol (VITAMIN D3) 5000 units CAPS Take 5,000 Units by mouth daily.  Marland Kitchen CIALIS 20 MG  tablet TAKE 1 TABLET BY MOUTH DAILY AS NEEDED FOR ERECTILE DYSFUNCTION.  Marland Kitchen Coenzyme Q10 (CO Q-10) 300 MG CAPS Take 300 mg by mouth daily.  . Cyanocobalamin (VITAMIN B-12) 5000 MCG TBDP Take 5,000 mcg by mouth daily.   . fexofenadine (ALLEGRA) 180 MG tablet Take 180 mg by mouth daily.  . Glucosamine-Chondroit-Vit C-Mn (GLUCOSAMINE 1500 COMPLEX PO) Take 1 tablet by mouth daily.   . Probiotic Product (PROBIOTIC PO) Take 1 tablet by mouth daily. PROBIOTICS 10  . ranitidine (ZANTAC) 150 MG capsule Take 300 mg by mouth daily.   . traMADol (ULTRAM) 50 MG tablet Take 1 tablet (50 mg total) by mouth every 4 (four) hours as needed for moderate pain.  . vitamin E 400 UNIT capsule Take 800 Units by mouth daily.   . [DISCONTINUED] amiodarone (PACERONE) 400 MG tablet Take 1 tablet (400 mg total) by mouth 2 (two) times daily.  . [DISCONTINUED] Metoprolol Tartrate 37.5 MG TABS Take 37.5 mg by mouth 2 (two) times daily.    Allergies  Allergen Reactions  . Latex Anaphylaxis  .  Penicillins Other (See Comments)    Causes hives when combined with sulfa, CAN take penicillin by itself  . Sulfa Antibiotics Other (See Comments)    Hives...only when mixed sulfa and pencillin together    Social History   Tobacco Use  . Smoking status: Never Smoker  . Smokeless tobacco: Never Used  Substance Use Topics  . Alcohol use: Yes    Comment: 10/02/2013 "hard apple cider maybe once/month"  . Drug use: No   Social History   Social History Narrative  . Not on file    family history includes Diabetes in his father and mother; Heart disease in his father.  Wt Readings from Last 3 Encounters:  09/08/17 203 lb 3.2 oz (92.2 kg)  08/23/17 208 lb (94.3 kg)  08/21/17 204 lb (92.5 kg)    PHYSICAL EXAM BP 122/78   Pulse 69   Ht 6\' 1"  (1.854 m)   Wt 203 lb 3.2 oz (92.2 kg)   BMI 26.81 kg/m  Physical Exam  Constitutional: He is oriented to person, place, and time. He appears well-developed and well-nourished. No  distress.  Well-groomed.  Healthy-appearing  HENT:  Head: Normocephalic and atraumatic.  Neck: No hepatojugular reflux and no JVD present. Carotid bruit is not present.  Cardiovascular: Normal rate, regular rhythm, normal heart sounds and intact distal pulses. Exam reveals no gallop and no friction rub.  No murmur heard. Well-healed sternotomy scar.  Mild tenderness  Pulmonary/Chest: Effort normal and breath sounds normal. No respiratory distress. He has no wheezes. He has no rales. He exhibits tenderness (Mild).  Abdominal: Soft. Bowel sounds are normal. He exhibits no distension. There is no tenderness. There is no rebound.  Musculoskeletal: Normal range of motion. He exhibits no edema.  Neurological: He is alert and oriented to person, place, and time.  Psychiatric: He has a normal mood and affect. His behavior is normal. Thought content normal.  Nursing note and vitals reviewed.   Adult ECG Report  Rate: 69;  Rhythm: normal sinus rhythm and Incomplete right bundle branch block.  Otherwise normal axis, intervals and durations;   Narrative Interpretation: Stable EKG   Other studies Reviewed: Additional studies/ records that were reviewed today include:  Recent Labs:   CBC Latest Ref Rng & Units 09/02/2017 08/23/2017 08/17/2017  WBC 3.8 - 10.8 Thousand/uL 8.7 12.2(H) 14.8(H)  Hemoglobin 13.2 - 17.1 g/dL 10.6(L) 10.3(L) 11.1(L)  Hematocrit 38.5 - 50.0 % 33.5(L) 31.0(L) 33.9(L)  Platelets 140 - 400 Thousand/uL 586(H) 487(H) 145(L)     ASSESSMENT / PLAN: As far as his postop A. fib, he is still on 400 twice daily amiodarone p.o. I will reduce it to 400 mg daily for the rest this week and then down to 200 mg daily to complete a full month.  I suspect that this will probably be stopped by Dr. Cyndia Bent when he sees him in follow-up.  Since we will be weaning off the amiodarone, and his blood pressure seems to be a little higher at home and is today, I will increase him to a total of 100 mg a  day Toprol (had been on 50 before). CBC recheck shows mild postop anemia, hopefully will improve.  Probably would not require iron supplementation.  I would defer timing of follow-up imaging evaluation of his thoracic aorta to Dr. Cyndia Bent in follow-up.  Problem List Items Addressed This Visit    Aortic root dilatation (HCC)   Relevant Medications   metoprolol succinate (TOPROL-XL) 100 MG 24 hr tablet  amiodarone (PACERONE) 200 MG tablet   Atherogenic dyslipidemia (Chronic)   Essential hypertension - Primary (Chronic)   Relevant Medications   metoprolol succinate (TOPROL-XL) 100 MG 24 hr tablet   amiodarone (PACERONE) 200 MG tablet   Postoperative anemia   Postoperative atrial fibrillation (HCC)   Relevant Medications   metoprolol succinate (TOPROL-XL) 100 MG 24 hr tablet   amiodarone (PACERONE) 200 MG tablet   S/P aortic aneurysm repair      Current medicines are reviewed at length with the patient today. (+/- concerns) still taking 400 mg BID Amiodarone The following changes have been made: see below.  Patient Instructions  MEDICATION INSTRUCTIONS   DECREASE AMIODARONE TO 400 MG  TABLET DAILY TIL Sunday,THEN AMIODARONE 200 MG DAILY FOR ONE MONTH   FINISH  METOPROLOL 37.5 MG-  THEN  CHANGE  TO METOPROLOL SUCCINATE 100 MG DAILY    Your physician wants you to follow-up in 4 -[place] [facility].You will receive a reminder letter in the mail two months in advance. If you don't receive a letter, please call our office to schedule the follow-up appointment.   If you need a refill on your cardiac medications before your next appointment, please call your pharmacy.    Studies Ordered:   No orders of the defined types were placed in this encounter.     Glenetta Hew, M.D., M.S. Interventional Cardiologist   Pager # [phone] Phone # [phone] [address] [place], [place] [address]   Thank you for choosing [facility] at [facility]!!

## 2017-09-08 NOTE — Patient Instructions (Addendum)
MEDICATION INSTRUCTIONS   DECREASE AMIODARONE TO 400 MG  TABLET DAILY TIL Sunday,THEN AMIODARONE 200 MG DAILY FOR ONE MONTH   FINISH  METOPROLOL 37.5 MG-  THEN  CHANGE  TO METOPROLOL SUCCINATE 100 MG DAILY    Your physician wants you to follow-up in 4 -[place].You will receive a reminder letter in the mail two months in advance. If you don't receive a letter, please call our office to schedule the follow-up appointment.   If you need a refill on your cardiac medications before your next appointment, please call your pharmacy.

## 2017-09-09 MED FILL — METOPROLOL SUCCINATE ER 100: 100 | 90 days supply | Qty: 90 | Fill #0

## 2017-09-09 MED FILL — AMIODARONE HCL 200 MG TAB: 200 | 30 days supply | Qty: 30 | Fill #0

## 2017-09-10 ENCOUNTER — Encounter: Payer: Self-pay | Admitting: Cardiology

## 2017-09-10 DIAGNOSIS — D649 Anemia, unspecified: Secondary | ICD-10-CM | POA: Insufficient documentation

## 2017-09-13 NOTE — Addendum Note (Signed)
Addended by: Zebedee Iba on: 09/13/2017 02:38 PM   Modules accepted: Orders

## 2017-09-27 ENCOUNTER — Other Ambulatory Visit: Payer: Self-pay | Admitting: Surgery

## 2017-09-27 DIAGNOSIS — Z9889 Other specified postprocedural states: Principal | ICD-10-CM

## 2017-09-27 DIAGNOSIS — Z8679 Personal history of other diseases of the circulatory system: Secondary | ICD-10-CM

## 2017-09-28 ENCOUNTER — Ambulatory Visit: Payer: Self-pay | Admitting: Surgery

## 2017-10-04 ENCOUNTER — Telehealth: Payer: Self-pay | Admitting: Cardiology

## 2017-10-04 MED ORDER — AMIODARONE HCL 200 MG PO TABS: 200.0000 mg | ORAL_TABLET | Freq: Every day | ORAL | 0 refills | Status: DC

## 2017-10-04 MED FILL — AMIODARONE HCL 200 MG TAB: 200 | 30 days supply | Qty: 30 | Fill #0

## 2017-10-04 NOTE — Telephone Encounter (Signed)
Spoke to patient. No samples available - medication is generic Patient state he will be 2 days short of medication until he has a visit with Dr Lovena Le.  Per patient request,  A  RX was  e-sent to pharmacy. For 30 day supply amoidarone

## 2017-10-04 NOTE — Telephone Encounter (Signed)
New Message  .Patient calling the office for samples of medication:   1.  What medication and dosage are you requesting samples for? amiodarone (PACERONE) 200 MG tablet  2.  Are you currently out of this medication? yes

## 2017-10-10 DIAGNOSIS — T161XXA Foreign body in right ear, initial encounter: Secondary | ICD-10-CM | POA: Diagnosis not present

## 2017-10-10 DIAGNOSIS — H6121 Impacted cerumen, right ear: Secondary | ICD-10-CM | POA: Diagnosis not present

## 2017-10-10 DIAGNOSIS — H903 Sensorineural hearing loss, bilateral: Secondary | ICD-10-CM | POA: Insufficient documentation

## 2017-10-12 ENCOUNTER — Ambulatory Visit
Admission: RE | Admit: 2017-10-12 | Discharge: 2017-10-12 | Disposition: A | Discharge status: Home / Self Care | Payer: 59 | Source: Ambulatory Visit | Attending: Surgery | Admitting: Surgery

## 2017-10-12 ENCOUNTER — Ambulatory Visit (INDEPENDENT_AMBULATORY_CARE_PROVIDER_SITE_OTHER): Payer: Self-pay | Admitting: Surgery

## 2017-10-12 ENCOUNTER — Other Ambulatory Visit: Payer: Self-pay | Admitting: *Deleted

## 2017-10-12 ENCOUNTER — Other Ambulatory Visit: Payer: Self-pay | Admitting: Surgery

## 2017-10-12 VITALS — BP 138/91 | HR 99 | Resp 20 | Ht 73.0 in | Wt 208.0 lb

## 2017-10-12 DIAGNOSIS — Z9889 Other specified postprocedural states: Principal | ICD-10-CM

## 2017-10-12 DIAGNOSIS — Z8679 Personal history of other diseases of the circulatory system: Secondary | ICD-10-CM

## 2017-10-12 DIAGNOSIS — I77810 Thoracic aortic ectasia: Secondary | ICD-10-CM

## 2017-10-12 DIAGNOSIS — Z95828 Presence of other vascular implants and grafts: Secondary | ICD-10-CM | POA: Diagnosis not present

## 2017-10-13 ENCOUNTER — Telehealth: Payer: Self-pay | Admitting: Cardiology

## 2017-10-13 ENCOUNTER — Encounter: Payer: Self-pay | Admitting: Surgery

## 2017-10-13 MED ORDER — AMLODIPINE BESYLATE 2.5 MG PO TABS: 2.5000 mg | ORAL_TABLET | Freq: Every day | ORAL | 3 refills | Status: DC

## 2017-10-13 NOTE — Telephone Encounter (Signed)
Probably okay to restart amlodipine 2.5 mg daily.  Glenetta Hew, MD

## 2017-10-13 NOTE — Telephone Encounter (Signed)
Spoke to patient . Per dr harding,restart 2.5 mg amlodipine qd. Patient verbalized undesranding.

## 2017-10-13 NOTE — Progress Notes (Signed)
HPI: Patient returns for routine postoperative follow-up having undergone valve sparing aortic root replacement for a 5.5 cm aortic root aneurysm on 08/15/2017. The patient's early postoperative recovery while in the hospital was notable for development of postoperative atrial fibrillation that was converted with amiodarone.  He has not had any tachypalpitations since discharge. Since hospital discharge the patient reports that he has been feeling well.  He is walking without chest pain or shortness of breath and his stamina is continuing to improve..   Current Outpatient Medications  Medication Sig Dispense Refill  . acetaminophen (TYLENOL) 500 MG tablet Take 500 mg by mouth every 6 (six) hours as needed for moderate pain or headache.    . Ascorbic Acid (VITAMIN C) 1000 MG tablet Take 1,000 mg by mouth daily.    Marland Kitchen aspirin EC 325 MG EC tablet Take 1 tablet (325 mg total) by mouth daily. 30 tablet 0  . atorvastatin (LIPITOR) 20 MG tablet Take 1 tablet (20 mg total) by mouth daily. 90 tablet 3  . Cholecalciferol (VITAMIN D3) 5000 units CAPS Take 5,000 Units by mouth daily.    . Coenzyme Q10 (CO Q-10) 300 MG CAPS Take 300 mg by mouth daily.    . Cyanocobalamin (VITAMIN B-12) 5000 MCG TBDP Take 5,000 mcg by mouth daily.     . fexofenadine (ALLEGRA) 180 MG tablet Take 180 mg by mouth daily.    . Glucosamine-Chondroit-Vit C-Mn (GLUCOSAMINE 1500 COMPLEX PO) Take 1 tablet by mouth daily.     . metoprolol succinate (TOPROL-XL) 100 MG 24 hr tablet Take 1 tablet (100 mg total) by mouth daily. Take with or immediately following a meal. 90 tablet 3  . Probiotic Product (PROBIOTIC PO) Take 1 tablet by mouth daily. PROBIOTICS 10    . ranitidine (ZANTAC) 150 MG capsule Take 300 mg by mouth daily.     . traMADol (ULTRAM) 50 MG tablet Take 1 tablet (50 mg total) by mouth every 4 (four) hours as needed for moderate pain. 30 tablet 0  . vitamin E 400 UNIT capsule Take 800 Units by mouth daily.      No  current facility-administered medications for this visit.     Physical Exam: BP (!) 138/91   Pulse 99   Resp 20   Ht 6\' 1"  (1.854 m)   Wt 208 lb (94.3 kg)   SpO2 99% Comment: RA  BMI 27.44 kg/m  He looks well. Lung exam is clear. Cardiac exam shows a regular rate and rhythm with normal heart sounds.  There is no murmur. Chest incision is healing well and sternum is stable. There is no peripheral edema.    Diagnostic Tests:  CLINICAL DATA:  Status post aortic aneurysm repair.  EXAM: CHEST - 2 VIEW  COMPARISON:  Radiographs of August 19, 2017.  FINDINGS: Stable cardiomediastinal silhouette. Sternotomy wires are noted. No pneumothorax or pleural effusion is noted. Both lungs are clear. The visualized skeletal structures are unremarkable.  IMPRESSION: No active cardiopulmonary disease.   Electronically Signed   By: Marijo Conception, M.D.   On: 10/12/2017 15:27   Impression:  He is making an excellent recovery following valve sparing aortic root replacement.  He has gotten back to driving.  I asked him not to lift anything heavier than 10 pounds for 3 months postoperatively.  He will not be able to return to his job until 11/14/2017 due to the lifting restrictions.  I will plan to see him back in about 1 month  and will do an echocardiogram at that time to examine his aortic valve as a baseline.  He will require an echocardiogram at 1 year and also a CTA of the chest to follow-up on the remainder of his aorta at that time.  He appears to be maintaining sinus rhythm and I told him to discontinue the amiodarone at this time.  Plan:  He will continue to follow-up with Dr. Dennard Schaumann and Dr. Ellyn Hack and I will see him back in 1 month with a 2D echocardiogram.   [name], MD [facility] [phone]

## 2017-10-13 NOTE — Telephone Encounter (Signed)
New message     Pt c/o BP issue: STAT if pt c/o blurred vision, one-sided weakness or slurred speech  1. What are your last 5 BP readings? 131/94 128/95 130/93 128/95  2. Are you having any other symptoms (ex. Dizziness, headache, blurred vision, passed out)?  No   3. What is your BP issue? Patient is concerned with dystolic pressure being high should he resume the amlodipine?

## 2017-10-13 NOTE — Telephone Encounter (Signed)
Returned call to patient.He stated B/P has been elevated as listed below.Pulse ranging in 70's.Stated he takes Metoprolol 100 mg daily.Amlodipine was stopped in the hospital.Message sent to [facility] for advice.

## 2017-10-14 MED FILL — AMLODIPINE BESYLATE 2.5 MG: 2.5 | 90 days supply | Qty: 90 | Fill #0

## 2017-10-21 ENCOUNTER — Other Ambulatory Visit: Payer: Self-pay

## 2017-10-21 ENCOUNTER — Ambulatory Visit (HOSPITAL_COMMUNITY): Payer: 59 | Attending: Cardiovascular Disease

## 2017-10-21 DIAGNOSIS — Z86718 Personal history of other venous thrombosis and embolism: Secondary | ICD-10-CM | POA: Diagnosis not present

## 2017-10-21 DIAGNOSIS — I77810 Thoracic aortic ectasia: Secondary | ICD-10-CM | POA: Diagnosis not present

## 2017-10-21 DIAGNOSIS — I340 Nonrheumatic mitral (valve) insufficiency: Secondary | ICD-10-CM | POA: Diagnosis not present

## 2017-10-21 DIAGNOSIS — Z9889 Other specified postprocedural states: Secondary | ICD-10-CM | POA: Insufficient documentation

## 2017-10-21 DIAGNOSIS — I2510 Atherosclerotic heart disease of native coronary artery without angina pectoris: Secondary | ICD-10-CM | POA: Diagnosis not present

## 2017-10-21 DIAGNOSIS — I10 Essential (primary) hypertension: Secondary | ICD-10-CM | POA: Diagnosis not present

## 2017-10-21 DIAGNOSIS — Z8679 Personal history of other diseases of the circulatory system: Secondary | ICD-10-CM | POA: Insufficient documentation

## 2017-10-21 DIAGNOSIS — Z8249 Family history of ischemic heart disease and other diseases of the circulatory system: Secondary | ICD-10-CM | POA: Insufficient documentation

## 2017-10-25 ENCOUNTER — Telehealth: Payer: Self-pay | Admitting: Cardiology

## 2017-10-25 NOTE — Telephone Encounter (Signed)
Pt c/o BP issue: STAT if pt c/o blurred vision, one-sided weakness or slurred speech  1. What are your last 5 BP readings? 124/95   126/93   121/94    2. Are you having any other symptoms (ex. Dizziness, headache, blurred vision, passed out)? no  3. What is your BP issue? Bottom number high, pt verbalized that he has open heart surgery on 08/15/2017

## 2017-10-25 NOTE — Telephone Encounter (Signed)
Okay for patient to continue to monitor BP, keep low sodium diet, and bring readings to f/u visit with thoracic surgeon on 11/09/2017. MD to adjust medication as needed during appointment.

## 2017-10-25 NOTE — Telephone Encounter (Signed)
Spoke with patient and his blood pressure is running in the 120's/90's and he is worried about the 90's. Prior to surgery it was in DBP was in the 60's. Will forward to Pharm D for review

## 2017-11-01 NOTE — Telephone Encounter (Signed)
Patient aware.

## 2017-11-09 ENCOUNTER — Other Ambulatory Visit: Payer: Self-pay

## 2017-11-09 ENCOUNTER — Ambulatory Visit (INDEPENDENT_AMBULATORY_CARE_PROVIDER_SITE_OTHER): Payer: Self-pay | Admitting: Surgery

## 2017-11-09 ENCOUNTER — Encounter: Payer: Self-pay | Admitting: Surgery

## 2017-11-09 VITALS — BP 123/84 | HR 80 | Resp 16 | Ht 73.0 in | Wt 209.0 lb

## 2017-11-09 DIAGNOSIS — Z9889 Other specified postprocedural states: Secondary | ICD-10-CM

## 2017-11-09 DIAGNOSIS — Z8679 Personal history of other diseases of the circulatory system: Secondary | ICD-10-CM

## 2017-11-09 DIAGNOSIS — I7121 Aneurysm of the ascending aorta, without rupture: Secondary | ICD-10-CM

## 2017-11-09 DIAGNOSIS — I719 Aortic aneurysm of unspecified site, without rupture: Secondary | ICD-10-CM

## 2017-11-09 MED ORDER — AMLODIPINE BESY-BENAZEPRIL HCL 5-20 MG PO CAPS: 1.0000 | ORAL_CAPSULE | Freq: Every day | ORAL | 12 refills | Status: DC

## 2017-11-09 MED FILL — AMLODIPINE-BENAZEPRIL 5-20: 5-20 | 30 days supply | Qty: 30 | Fill #0

## 2017-11-09 NOTE — Progress Notes (Signed)
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 The patient's early postoperative recovery while in the hospital was notable for development of postoperative atrial fibrillation that was converted with amiodarone.  I stop that at his last visit on 10/12/2017 since he was maintaining sinus rhythm.  He has been feeling well and is walking daily without chest pain or shortness of breath.  His stamina is back to normal.  He denies any tachypalpitations.  He has been checking his blood pressure at home and noted that his diastolic blood pressure was up in the 90s.  He talk with Dr. Allison Quarry office and was started back on Norvasc 2.5 mg daily which did not make any difference.  Prior to surgery he was on Lotrel 5mg /20mg  daily which seemed to give him good blood pressure control.  This was not resumed postoperatively since his blood pressure was running lower at that time.    Current Outpatient Medications  Medication Sig Dispense Refill  . acetaminophen (TYLENOL) 500 MG tablet Take 500 mg by mouth every 6 (six) hours as needed for moderate pain or headache.    . Ascorbic Acid (VITAMIN C) 1000 MG tablet Take 1,000 mg by mouth daily.    Marland Kitchen aspirin EC 325 MG EC tablet Take 1 tablet (325 mg total) by mouth daily. 30 tablet 0  . atorvastatin (LIPITOR) 20 MG tablet Take 1 tablet (20 mg total) by mouth daily. 90 tablet 3  . Cholecalciferol (VITAMIN D3) 5000 units CAPS Take 5,000 Units by mouth daily.    . Coenzyme Q10 (CO Q-10) 300 MG CAPS Take 300 mg by mouth daily.    . Cyanocobalamin (VITAMIN B-12) 5000 MCG TBDP Take 5,000 mcg by mouth daily.     . fexofenadine (ALLEGRA) 180 MG tablet Take 180 mg by mouth daily.    . Glucosamine-Chondroit-Vit C-Mn (GLUCOSAMINE 1500 COMPLEX PO) Take 1 tablet by mouth daily.     . metoprolol succinate (TOPROL-XL) 100 MG 24 hr tablet Take 1 tablet (100 mg  total) by mouth daily. Take with or immediately following a meal. 90 tablet 3  . Probiotic Product (PROBIOTIC PO) Take 1 tablet by mouth daily. PROBIOTICS 10    . ranitidine (ZANTAC) 150 MG capsule Take 300 mg by mouth daily.     . traMADol (ULTRAM) 50 MG tablet Take 1 tablet (50 mg total) by mouth every 4 (four) hours as needed for moderate pain. 30 tablet 0  . vitamin E 400 UNIT capsule Take 800 Units by mouth daily.     Marland Kitchen amLODipine-benazepril (LOTREL) 5-20 MG capsule Take 1 capsule by mouth daily. 30 capsule 12   No current facility-administered medications for this visit.      Physical Exam: BP 123/84 (BP Location: Right Arm, Patient Position: Sitting, Cuff Size: Large)   Pulse 80   Resp 16   Ht 6\' 1"  (1.854 m)   Wt 209 lb (94.8 kg)   SpO2 98% Comment: ON RA  BMI 27.57 kg/m  He looks well. Cardiac exam shows a regular rate and rhythm with normal heart sounds.  There is no murmur. Lungs are clear. Chest incision is healing well and the sternum is stable. There is no peripheral edema.  Diagnostic Tests:                          [facility] [address]  [address] [place], [place] [address]                            [id number]  ------------------------------------------------------------------- Transthoracic Echocardiography  Patient:    Lawrence, Liu MR #:       [id number] Study Date: 10/21/2017 Gender:     M Age:        62 Height:     185.4 cm Weight:     94.3 kg BSA:        2.22 m^2 Pt. Status: Room:   ORDERING     Gilford Raid, MD  [place], MD  ATTENDING    Sanda Klein, MD  PERFORMING   [facility], Outpatient  SONOGRAPHER  [facility], RDCS  cc:  ------------------------------------------------------------------- LV EF: 50% -   55%  ------------------------------------------------------------------- Indications:     Status Post Aortic Aneurysm Repair/Root Replacement  (Z98.890).  ------------------------------------------------------------------- History:   PMH:  Ascending Aortic Aneurysm Repair/Replacment (Jan. 2019), Dilated Aortic Root, Post Operative Atrial Fibrillation, Non-Obstructive CAD, History of DVT  Risk factors:  Family history of coronary artery disease. Hypertension.  ------------------------------------------------------------------- Study Conclusions  - Left ventricle: The cavity size was normal. Wall thickness was   normal. Systolic function was normal. The estimated ejection   fraction was in the range of 50% to 55%. Wall motion was normal;   there were no regional wall motion abnormalities. Left   ventricular diastolic function parameters were normal. - Ventricular septum: Septal motion showed paradox. These changes   are consistent with a post-thoracotomy state. - Mitral valve: There was mild regurgitation. - Left atrium: The atrium was moderately dilated.  ------------------------------------------------------------------- Study data:  Comparison was made to the study of 08/15/2017.  Study status:  Routine.  Procedure:  Transthoracic echocardiography. Image quality was adequate.          Transthoracic echocardiography.  M-mode, complete 2D, 3D, spectral Doppler, and color Doppler.  Birthdate:  Patient birthdate: 1956/04/02.  Age: Patient is 62 yr old.  Sex:  Gender: male.    BMI: 27.4 kg/m^2. Blood pressure:     138/91  Patient status:  Outpatient.  Study date:  Study date: 10/21/2017. Study time: 01:17 PM.  Location: [place] [facility]  -------------------------------------------------------------------  ------------------------------------------------------------------- Left ventricle:  The cavity size was normal. Wall thickness was normal. Systolic function was normal. The estimated ejection fraction was in the range of 50% to 55%. Wall motion was normal; there were no regional wall motion abnormalities. The  transmitral flow pattern was normal. The deceleration time of the early transmitral flow velocity was normal. The pulmonary vein flow pattern was normal. The tissue Doppler parameters were normal. Left ventricular diastolic function parameters were normal.  ------------------------------------------------------------------- Aortic valve:   Structurally normal valve. Trileaflet. Cusp separation was normal.  Doppler:  Transvalvular velocity was within the normal range. There was no stenosis. There was no regurgitation.  ------------------------------------------------------------------- Aorta:  The aorta was poorly visualized. Aortic root: The aortic root was normal in size. Ascending aorta: The ascending aorta was normal in size.  ------------------------------------------------------------------- Mitral valve:   Structurally normal valve.   Leaflet separation was normal.  Doppler:  Transvalvular velocity was within the normal range. There was no evidence for stenosis. There was mild regurgitation.  ------------------------------------------------------------------- Left atrium:  The atrium was moderately  dilated.  ------------------------------------------------------------------- Right ventricle:  The cavity size was normal. Systolic function was normal.  ------------------------------------------------------------------- Ventricular septum:   Septal motion showed paradox. These changes are consistent with a post-thoracotomy state.  ------------------------------------------------------------------- Pulmonic valve:   Poorly visualized.  The valve appears to be grossly normal.   Cusp separation was normal.  Doppler: Transvalvular velocity was within the normal range. There was no regurgitation.  ------------------------------------------------------------------- Tricuspid valve:   Structurally normal valve.   Leaflet separation was normal.  Doppler:  Transvalvular  velocity was within the normal range. There was no regurgitation.  ------------------------------------------------------------------- Pulmonary artery:    Systolic pressure could not be accurately estimated.  ------------------------------------------------------------------- Right atrium:  The atrium was normal in size.  ------------------------------------------------------------------- Pericardium:  There was no pericardial effusion.   ------------------------------------------------------------------- Post procedure conclusions Ascending Aorta:  - The aorta was poorly visualized.  ------------------------------------------------------------------- Measurements   Left ventricle                         Value        Reference  LV ID, ED, PLAX chordal                51.5  mm     43 - 52  LV ID, ES, PLAX chordal                35    mm     23 - 38  LV fx shortening, PLAX chordal         32    %      >=29  LV PW thickness, ED                    8.71  mm     ----------  IVS/LV PW ratio, ED                    1.26         <=1.3  Stroke volume, 2D                      110   ml     ----------  Stroke volume/bsa, 2D                  50    ml/m^2 ----------  LV e&', lateral                         11.9  cm/s   ----------  LV E/e&', lateral                       4.81         ----------  LV e&', medial                          7.94  cm/s   ----------  LV E/e&', medial                        7.2          ----------  LV e&', average                         9.92  cm/s   ----------  LV E/e&', average  5.77         ----------    Ventricular septum                     Value        Reference  IVS thickness, ED                      11    mm     ----------    LVOT                                   Value        Reference  LVOT ID, S                             26    mm     ----------  LVOT area                              5.31  cm^2   ----------  LVOT peak  velocity, S                  96.1  cm/s   ----------  LVOT mean velocity, S                  64.9  cm/s   ----------  LVOT VTI, S                            20.8  cm     ----------    Aorta                                  Value        Reference  Aortic root ID, ED                     39    mm     ----------  Ascending aorta ID, A-P, S             36    mm     ----------    Left atrium                            Value        Reference  LA ID, A-P, ES                         47    mm     ----------  LA ID/bsa, A-P                         2.12  cm/m^2 <=2.2  LA volume, ES, 1-p A4C                 67.5  ml     ----------  LA volume/bsa, ES, 1-p A4C             30.4  ml/m^2 ----------    Mitral valve  Value        Reference  Mitral E-wave peak velocity            57.2  cm/s   ----------  Mitral A-wave peak velocity            66.5  cm/s   ----------  Mitral deceleration time       (H)     415   ms     150 - 230  Mitral E/A ratio, peak                 0.9          ----------  Mitral regurg VTI, PISA                181   cm     ----------    Right atrium                           Value        Reference  RA ID, S-I, ES, A4C            (H)     59    mm     34 - 49  RA area, ES, A4C               (H)     21.4  cm^2   8.3 - 19.5  RA volume, ES, A/L                     63.5  ml     ----------  RA volume/bsa, ES, A/L                 28.6  ml/m^2 ----------    Right ventricle                        Value        Reference  TAPSE                                  15.7  mm     ----------  RV s&', lateral, S                      8.92  cm/s   ----------  Legend: (L)  and  (H)  mark values outside specified reference range.  ------------------------------------------------------------------- Prepared and Electronically Authenticated by  Sanda Klein, MD 2019-03-22T17:14:41     Impression:  Overall he is making excellent recovery following his surgery.  I have  personally reviewed his follow-up echocardiogram from 10/21/2017 and this shows a normal aortic valve with no stenosis or insufficiency.  Left ventricular function is normal.  He would like to resume his previous Lotrel dose so I ordered that for him today.  He will be on 5 mg / 20 mg once daily and will continue to follow-up his blood pressure at home.  Ideally I would like to keep him less than 130/80.  I told him that he can return to full activity and may return to work as of 11/14/2017.  I gave him a return to work slip today.  Plan:  I will plan to see him back in 1 year with a 2D echocardiogram to reassess his aortic valve and a CTA of the chest to be sure the remainder of his  aorta does not enlarge.  He will continue to follow-up with Dr. Glenetta Hew for his cardiology care.   [name], MD [facility] [phone]

## 2017-12-12 ENCOUNTER — Other Ambulatory Visit: Payer: Self-pay | Admitting: Cardiology

## 2017-12-12 MED FILL — ATORVASTATIN 20 MG TABLET: 20 | 90 days supply | Qty: 90 | Fill #0

## 2017-12-12 NOTE — Telephone Encounter (Signed)
Rx has been sent to the pharmacy electronically. ° °

## 2017-12-20 MED FILL — METOPROLOL SUCCINATE ER 100: 100 | 90 days supply | Qty: 90 | Fill #1

## 2018-03-14 MED FILL — AMLODIPINE-BENAZEPRIL 5-20: 5-20 | 90 days supply | Qty: 90 | Fill #1

## 2018-03-21 ENCOUNTER — Encounter (HOSPITAL_COMMUNITY): Payer: Self-pay

## 2018-03-21 ENCOUNTER — Other Ambulatory Visit: Payer: Self-pay

## 2018-03-21 ENCOUNTER — Emergency Department (HOSPITAL_COMMUNITY)
Admission: EM | Admit: 2018-03-21 | Discharge: 2018-03-22 | Disposition: A | Discharge status: Home / Self Care | Payer: 59 | Attending: Emergency Medicine | Admitting: Emergency Medicine

## 2018-03-21 ENCOUNTER — Emergency Department (HOSPITAL_COMMUNITY): Payer: 59

## 2018-03-21 DIAGNOSIS — I10 Essential (primary) hypertension: Secondary | ICD-10-CM | POA: Insufficient documentation

## 2018-03-21 DIAGNOSIS — Z79899 Other long term (current) drug therapy: Secondary | ICD-10-CM | POA: Diagnosis not present

## 2018-03-21 DIAGNOSIS — Z7982 Long term (current) use of aspirin: Secondary | ICD-10-CM | POA: Diagnosis not present

## 2018-03-21 DIAGNOSIS — Z91040 Latex allergy status: Secondary | ICD-10-CM | POA: Insufficient documentation

## 2018-03-21 DIAGNOSIS — R1013 Epigastric pain: Secondary | ICD-10-CM | POA: Diagnosis not present

## 2018-03-21 DIAGNOSIS — R079 Chest pain, unspecified: Secondary | ICD-10-CM | POA: Diagnosis not present

## 2018-03-21 DIAGNOSIS — I2510 Atherosclerotic heart disease of native coronary artery without angina pectoris: Secondary | ICD-10-CM | POA: Insufficient documentation

## 2018-03-21 LAB — BASIC METABOLIC PANEL
ANION GAP: 10 (ref 5–15)
BUN: 19 mg/dL (ref 6–20)
CHLORIDE: 108 mmol/L (ref 98–111)
CO2: 23 mmol/L (ref 22–32)
CREATININE: 1.3 mg/dL — AB (ref 0.61–1.24)
Calcium: 8.9 mg/dL (ref 8.9–10.3)
GFR calc non Af Amer: 59 mL/min — ABNORMAL LOW (ref >60)
Glucose, Bld: 130 mg/dL — ABNORMAL HIGH (ref 70–99)
POTASSIUM: 3.9 mmol/L (ref 3.5–5.1)
SODIUM: 141 mmol/L (ref 135–145)

## 2018-03-21 LAB — PROTIME-INR
INR: 0.85
Prothrombin Time: 11.6 seconds (ref 11.4–15.2)

## 2018-03-21 LAB — CBC
HEMATOCRIT: 41.7 % (ref 39.0–52.0)
HEMOGLOBIN: 13.7 g/dL (ref 13.0–17.0)
MCH: 27.3 pg (ref 26.0–34.0)
MCHC: 32.9 g/dL (ref 30.0–36.0)
MCV: 83.2 fL (ref 78.0–100.0)
PLATELETS: 235 10*3/uL (ref 150–400)
RBC: 5.01 MIL/uL (ref 4.22–5.81)
RDW: 14.6 % (ref 11.5–15.5)
WBC: 7 10*3/uL (ref 4.0–10.5)

## 2018-03-21 LAB — HEPATIC FUNCTION PANEL
ALT: 23 U/L (ref 0–44)
AST: 27 U/L (ref 15–41)
Albumin: 4.2 g/dL (ref 3.5–5.0)
Alkaline Phosphatase: 66 U/L (ref 38–126)
BILIRUBIN DIRECT: 0.1 mg/dL (ref 0.0–0.2)
Indirect Bilirubin: 0.6 mg/dL (ref 0.3–0.9)
TOTAL PROTEIN: 7.1 g/dL (ref 6.5–8.1)
Total Bilirubin: 0.7 mg/dL (ref 0.3–1.2)

## 2018-03-21 LAB — I-STAT TROPONIN, ED: Troponin i, poc: 0.01 ng/mL (ref 0.00–0.08)

## 2018-03-21 LAB — LIPASE, BLOOD: Lipase: 38 U/L (ref 11–51)

## 2018-03-21 MED ORDER — SODIUM CHLORIDE 0.9 % IV BOLUS
1000.0000 mL | Freq: Once | INTRAVENOUS | Status: AC
  Administered 2018-03-21: 1000 mL via INTRAVENOUS

## 2018-03-21 MED ORDER — GI COCKTAIL ~~LOC~~
30.0000 mL | Freq: Once | ORAL | Status: AC
  Administered 2018-03-21: 30 mL via ORAL
  Filled 2018-03-21: qty 30

## 2018-03-21 NOTE — ED Provider Notes (Addendum)
[facility] DEPT Provider Note  CSN: [id number] Arrival date & time: 03/21/18 2243  Chief Complaint(s) Chest Pain  HPI Lawrence Liu is a 62 y.o. male with past medical history listed below including aortic root dilatation status post repair 7 months ago.  The history is provided by the patient.  Abdominal Pain   This is a new problem. The current episode started 1 to 2 hours ago. The problem occurs constantly. The problem has been gradually improving. The pain is associated with an unknown factor. The pain is located in the epigastric region and RUQ. The quality of the pain is cramping and colicky. The pain is severe. Pertinent negatives include fever, diarrhea, hematochezia, melena, nausea, vomiting and frequency. Nothing aggravates the symptoms. Nothing relieves the symptoms.    Past Medical History Past Medical History:  Diagnosis Date  . Alpha galactosidase deficiency    multiple tick bites  . Deep vein thrombosis ([place]) 2004   Left deep vein thrombosis of left calf  . GERD (gastroesophageal reflux disease)   . History of blood transfusion    as a baby for jaundice  . Hypertension   . 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 No lesions greater than 30% noted. - confirmed by Cor CTA  . Pneumonia 1980's   walking pneumonia  . Status post ascending aortic aneurysm repair 08/2016   Dr. Cyndia Bent  . Thoracic ascending aortic aneurysm ([place]) 11/06/2013   CTA of chest November 2017 showed notable increase in size --> s/p Open (Valve Sparing) Repair Jan 2019)   Patient Active Problem List   Diagnosis Date Noted  . Postoperative anemia 09/10/2017  . Postoperative atrial fibrillation ([place]) 09/08/2017  . S/P aortic aneurysm repair 08/15/2017  . Essential hypertension 10/01/2016  . Aortic root dilatation ([place]) 11/06/2013  . 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 PVC's (premature ventricular contractions)  10/02/2013  . History of DVT (deep vein thrombosis) 10/02/2013  . GERD (gastroesophageal reflux disease)   . Arthritis   . Deep vein thrombosis ([place])    Home Medication(s) Prior to Admission medications   Medication Sig Start Date End Date Taking? Authorizing Provider  acetaminophen (TYLENOL) 500 MG tablet Take 500 mg by mouth every 6 (six) hours as needed for moderate pain or headache.   Yes [provider]  amLODipine-benazepril (LOTREL) 5-20 MG capsule Take 1 capsule by mouth daily. 11/09/17  Yes [name], MD  Ascorbic Acid (VITAMIN C) 1000 MG tablet Take 1,000 mg by mouth daily.   Yes [provider]  aspirin EC 325 MG EC tablet Take 1 tablet (325 mg total) by mouth daily. 08/22/17  Yes Barrett, Erin R, PA-C  atorvastatin (LIPITOR) 20 MG tablet TAKE 1 TABLET (20 MG TOTAL) BY MOUTH DAILY. 12/12/17  Yes Leonie Man, MD  Cholecalciferol (VITAMIN D3) 5000 units CAPS Take 5,000 Units by mouth daily.   Yes [provider]  Coenzyme Q10 (CO Q-10) 300 MG CAPS Take 300 mg by mouth daily.   Yes [provider]  Cyanocobalamin (VITAMIN B-12) 5000 MCG TBDP Take 5,000 mcg by mouth daily.    Yes [provider]  fexofenadine (ALLEGRA) 180 MG tablet Take 180 mg by mouth daily.   Yes [provider]  metoprolol succinate (TOPROL-XL) 100 MG 24 hr tablet Take 1 tablet (100 mg total) by mouth daily. Take with or immediately following a meal. 09/08/17 03/22/18 Yes Leonie Man, MD  Probiotic Product (PROBIOTIC PO) Take 1  tablet by mouth daily. PROBIOTICS 10   Yes [provider]  ranitidine (ZANTAC) 150 MG capsule Take 300 mg by mouth daily.    Yes [provider]  vitamin E 400 UNIT capsule Take 800 Units by mouth daily.    Yes [provider]                                                                                                                                    Past Surgical History Past Surgical History:    Procedure Laterality Date  . ASCENDING AORTIC ROOT REPLACEMENT N/A 08/15/2017   Procedure: ASCENDING AORTIC ROOT REPLACEMENT, (VALVE SPARING ROOT REPLACEMENT);  Surgeon: [name], MD;  Location: [facility];  Service: Open Heart Surgery;  Laterality: N/A;  . COLONOSCOPY WITH PROPOFOL N/A 11/23/2016   Procedure: COLONOSCOPY WITH PROPOFOL;  Surgeon: Juanita Craver, MD;  Location: [facility];  Service: Endoscopy;  Laterality: N/A;  . colonscopy  5 1/2 yrs ago   polyps removed  . EYE SURGERY Bilateral   . LEFT HEART CATHETERIZATION WITH CORONARY ANGIOGRAM N/A 10/03/2013   Procedure: LEFT HEART CATHETERIZATION WITH CORONARY ANGIOGRAM;  Surgeon: Jolaine Artist, MD;  Location: [facility] CATH LAB;  Service: Cardiovascular:  Nonobstructive disease - LAD diffuse 20-30%. Small distal vessel. Circumflex ostial 30-40%. Large OM 1 with 30% mid. Small OM 2 and several small PL branches. Large dominant RCA with 30% mid and diffuse 20-30% distal.  . SHOULDER ARTHROSCOPY  07/15/2011   Procedure: ARTHROSCOPY SHOULDER;  Surgeon: Metta Clines Supple;  Location: [facility];  Service: Orthopedics;  Laterality: Right;  RIGHT ARTHROSCOPY SUBACROMIAL DECOMPRESSION AND DISTAL CLAVICLE RESECTION  . TEE WITHOUT CARDIOVERSION N/A 08/15/2017   Procedure: TRANSESOPHAGEAL ECHOCARDIOGRAM (TEE);  Surgeon: [name], MD;  Location: [facility];  Service: Open Heart Surgery;  Laterality: N/A;  . TONSILLECTOMY  ~ 1970   adenoids also  . TRANSTHORACIC ECHOCARDIOGRAM  March 2015   Upper limit size LV. Mild LVH. EF 60%. Normal wall motion. Ascending aorta measures 48 mm at root tapering to 40 mm at proximal ascending. Mild RV dilation.   Family History Family History  Problem Relation Age of Onset  . Heart disease Father   . Diabetes Father   . Diabetes Mother     Social History Social History   Tobacco Use  . Smoking status: Never Smoker  . Smokeless tobacco: Never Used  Substance Use Topics  . Alcohol use: Yes    Comment: 10/02/2013  "hard apple cider maybe once/month"  . Drug use: No   Allergies Latex; Penicillins; and Sulfa antibiotics  Review of Systems Review of Systems  Constitutional: Negative for fever.  Gastrointestinal: Positive for abdominal pain. Negative for diarrhea, hematochezia, melena, nausea and vomiting.  Genitourinary: Negative for frequency.   All other systems are reviewed and are negative for acute change except as noted in the HPI  Physical Exam Vital Signs  I  have reviewed the triage vital signs BP 124/77   Pulse 70   Resp 14   SpO2 98%   Physical Exam  Constitutional: He is oriented to person, place, and time. He appears well-developed and well-nourished. No distress.  HENT:  Head: Normocephalic and atraumatic.  Nose: Nose normal.  Eyes: Pupils are equal, round, and reactive to light. Conjunctivae and EOM are normal. Right eye exhibits no discharge. Left eye exhibits no discharge. No scleral icterus.  Neck: Normal range of motion. Neck supple.  Cardiovascular: Normal rate and regular rhythm. Exam reveals no gallop and no friction rub.  No murmur heard. Pulmonary/Chest: Effort normal and breath sounds normal. No stridor. No respiratory distress. He has no rales.  Abdominal: Soft. He exhibits no distension. There is tenderness in the right upper quadrant and epigastric area. There is no rigidity, no rebound, no guarding, no CVA tenderness and negative Murphy's sign.  Musculoskeletal: He exhibits no edema or tenderness.  Neurological: He is alert and oriented to person, place, and time.  Skin: Skin is warm and dry. No rash noted. He is not diaphoretic. No erythema.  Psychiatric: He has a normal mood and affect.  Vitals reviewed.   ED Results and Treatments Labs (all labs ordered are listed, but only abnormal results are displayed) Labs Reviewed  BASIC METABOLIC PANEL - Abnormal; Notable for the following components:      Result Value   Glucose, Bld 130 (*)    Creatinine, Ser  1.30 (*)    GFR calc non Af Amer 59 (*)    All other components within normal limits  CBC  PROTIME-INR  HEPATIC FUNCTION PANEL  LIPASE, BLOOD  I-STAT TROPONIN, ED  I-STAT TROPONIN, ED                                                                                                                         EKG  EKG Interpretation  Date/Time:  Tuesday March 21 2018 22:43:35 EDT Ventricular Rate:  68 PR Interval:    QRS Duration: 110 QT Interval:  416 QTC Calculation: 443 R Axis:   73 Text Interpretation:  Sinus rhythm RESOLVED  STD in lateral leads when compared to tracing from 08/2017. NO STEMI Otherwise no significant change Confirmed by Addison Lank [phone]) on 03/21/2018 11:54:40 PM Also confirmed by Addison Lank [phone]), editor Hattie Perch ([phone])  on 03/22/2018 7:54:30 AM      Radiology Dg Chest 2 View  Result Date: 03/21/2018 CLINICAL DATA:  Sudden onset mid chest pain radiating to the back with diaphoresis. EXAM: CHEST - 2 VIEW COMPARISON:  10/12/2017 FINDINGS: Heart size is normal. Median sternotomy sutures are in place. No aortic aneurysm. No mediastinal widening. Clear lungs without pneumothorax nor effusion. No acute nor suspicious osseous abnormalities. IMPRESSION: No active cardiopulmonary disease. Electronically Signed   By: Ashley Royalty M.D.   On: 03/21/2018 23:16   US Abdomen Limited Ruq  Result Date: 03/22/2018 CLINICAL DATA:  Epigastric abdominal pain. EXAM: ULTRASOUND  ABDOMEN LIMITED RIGHT UPPER QUADRANT COMPARISON:  None. FINDINGS: Gallbladder: Physiologically distended. No gallstones or wall thickening visualized. No sonographic Murphy sign noted by sonographer. Common bile duct: Diameter: 2 mm. Liver: No focal lesion identified. Within normal limits in parenchymal echogenicity. Portal vein is patent on color Doppler imaging with normal direction of blood flow towards the liver. IMPRESSION: Unremarkable right upper quadrant ultrasound. Electronically Signed    By: Jeb Levering M.D.   On: 03/22/2018 00:51   Pertinent labs & imaging results that were available during my care of the patient were reviewed by me and considered in my medical decision making (see chart for details).  Medications Ordered in ED Medications  gi cocktail (Maalox,Lidocaine,Donnatal) (30 mLs Oral Given 03/21/18 2342)  sodium chloride 0.9 % bolus 1,000 mL (0 mLs Intravenous Stopped 03/22/18 0012)  famotidine (PEPCID) IVPB 20 mg premix (0 mg Intravenous Stopped 03/22/18 0158)                                                                                                                                    Procedures Procedures  (including critical care time)  Medical Decision Making / ED Course I have reviewed the nursing notes for this encounter and the patient's prior records (if available in EHR or on provided paperwork).    Epigastric pain with tenderness to palpation.  Patient with stable vital signs.  Well-appearing and well-hydrated.  Nontoxic.  Does not appear to be in any acute distress.  Screening labs obtain and revealed no evidence of leukocytosis or anemia.  No significant electrolyte derangements.  Mild AKI likely due to dehydration.  No evidence of biliary obstruction or pancreatitis.  Presentation appears to be GI related.  Ultrasound without evidence of acute cholecystitis.  Patient provided with a GI cocktail which exacerbated the patient's pain.  IV Pepcid and fluids given resulting in significant improvement.  Given the patient's cardiac history, EKG obtained which revealed no evidence of acute ischemic changes or evidence of pericarditis.  Chest x-ray without evidence suggestive of pneumonia, pneumothorax, pneumomediastinum.  No abnormal contour of the mediastinum to suggest dissection. No evidence of acute injuries.  Initial troponin negative.  Patient is appropriate for delta troponin which was negative.  Given his significant improvement in the patient's  symptomatology, I have low suspicion for postsurgical complication.  Presentation not suspicious for aortic dissection.  The patient appears reasonably screened and/or stabilized for discharge and I doubt any other medical condition or other [facility] requiring further screening, evaluation, or treatment in the ED at this time prior to discharge.  The patient is safe for discharge with strict return precautions.    Final Clinical Impression(s) / ED Diagnoses Final diagnoses:  Epigastric abdominal pain    Disposition: Discharge  Condition: Good  I have discussed the results, Dx and Tx plan with the patient who expressed understanding and agree(s) with the plan. Discharge instructions discussed at great length. The  patient was given strict return precautions who verbalized understanding of the instructions. No further questions at time of discharge.    ED Discharge Orders    None       Follow Up: [name], MD [address] [place] [address] [phone]  Schedule an appointment as soon as possible for a visit  As needed  Gastroenterologist  Schedule an appointment as soon as possible for a visit  As needed     This chart was dictated using voice recognition software.  Despite best efforts to proofread,  errors can occur which can change the documentation meaning.     Fatima Blank, MD 03/22/18 [phone]

## 2018-03-21 NOTE — ED Triage Notes (Signed)
Pt suddenly had chest pain that radiated to his back and he became diaphoretic Pt had ab aortic repair in January

## 2018-03-22 ENCOUNTER — Emergency Department (HOSPITAL_COMMUNITY): Payer: 59

## 2018-03-22 DIAGNOSIS — R1013 Epigastric pain: Secondary | ICD-10-CM | POA: Diagnosis not present

## 2018-03-22 DIAGNOSIS — Z91040 Latex allergy status: Secondary | ICD-10-CM | POA: Diagnosis not present

## 2018-03-22 DIAGNOSIS — I10 Essential (primary) hypertension: Secondary | ICD-10-CM | POA: Diagnosis not present

## 2018-03-22 DIAGNOSIS — Z79899 Other long term (current) drug therapy: Secondary | ICD-10-CM | POA: Diagnosis not present

## 2018-03-22 DIAGNOSIS — Z7982 Long term (current) use of aspirin: Secondary | ICD-10-CM | POA: Diagnosis not present

## 2018-03-22 DIAGNOSIS — I2510 Atherosclerotic heart disease of native coronary artery without angina pectoris: Secondary | ICD-10-CM | POA: Diagnosis not present

## 2018-03-22 LAB — I-STAT TROPONIN, ED: TROPONIN I, POC: 0.01 ng/mL (ref 0.00–0.08)

## 2018-03-22 MED ORDER — FAMOTIDINE IN NACL 20-0.9 MG/50ML-% IV SOLN
20.0000 mg | Freq: Once | INTRAVENOUS | Status: AC
  Administered 2018-03-22: 20 mg via INTRAVENOUS
  Filled 2018-03-22: qty 50

## 2018-03-22 NOTE — Discharge Instructions (Addendum)

## 2018-03-28 MED FILL — METOPROLOL SUCCINATE ER 100: 100 | 90 days supply | Qty: 90 | Fill #2

## 2018-03-28 MED FILL — ATORVASTATIN CALCIUM 20 MG: 20 | 90 days supply | Qty: 90 | Fill #1

## 2018-05-03 ENCOUNTER — Encounter: Payer: Self-pay | Admitting: Physician Assistant

## 2018-05-03 ENCOUNTER — Ambulatory Visit (INDEPENDENT_AMBULATORY_CARE_PROVIDER_SITE_OTHER): Payer: 59 | Admitting: Physician Assistant

## 2018-05-03 VITALS — BP 110/64 | HR 96 | Temp 98.6°F | Resp 18 | Ht 73.0 in | Wt 215.6 lb

## 2018-05-03 DIAGNOSIS — J988 Other specified respiratory disorders: Secondary | ICD-10-CM | POA: Diagnosis not present

## 2018-05-03 DIAGNOSIS — B9689 Other specified bacterial agents as the cause of diseases classified elsewhere: Secondary | ICD-10-CM

## 2018-05-03 MED ORDER — AZITHROMYCIN 250 MG PO TABS: ORAL_TABLET | ORAL | 0 refills | Status: DC

## 2018-05-03 MED FILL — AZITHROMYCIN 250 MG TABLET: 250 | 5 days supply | Qty: 6 | Fill #0

## 2018-05-03 NOTE — Progress Notes (Signed)
Patient ID: Lawrence Liu MRN: [id number], DOB: 08-14-1955, 62 y.o. Date of Encounter: 05/03/2018, 11:36 AM    Chief Complaint:  Chief Complaint  Patient presents with  . Cough    sympotms 3 days ago   . Fever     HPI: 62 y.o. year old male presents with above.   Wife accompanies him for visit.  They report that he has had fever of 102 for 3 days.  States that he has been taking alternating Tylenol and ibuprofen and fever stays down as long as he is taking medicine.  He has had congestion in his head and nose as well as chest.  Having cough.  No significant sore throat or ear pain.     Home Meds:   Outpatient Medications Prior to Visit  Medication Sig Dispense Refill  . acetaminophen (TYLENOL) 500 MG tablet Take 500 mg by mouth every 6 (six) hours as needed for moderate pain or headache.    Marland Kitchen amLODipine-benazepril (LOTREL) 5-20 MG capsule Take 1 capsule by mouth daily. 30 capsule 12  . Ascorbic Acid (VITAMIN C) 1000 MG tablet Take 1,000 mg by mouth daily.    Marland Kitchen aspirin EC 325 MG EC tablet Take 1 tablet (325 mg total) by mouth daily. 30 tablet 0  . atorvastatin (LIPITOR) 20 MG tablet TAKE 1 TABLET (20 MG TOTAL) BY MOUTH DAILY. 90 tablet 3  . Cholecalciferol (VITAMIN D3) 5000 units CAPS Take 5,000 Units by mouth daily.    . Coenzyme Q10 (CO Q-10) 300 MG CAPS Take 300 mg by mouth daily.    . Cyanocobalamin (VITAMIN B-12) 5000 MCG TBDP Take 5,000 mcg by mouth daily.     . fexofenadine (ALLEGRA) 180 MG tablet Take 180 mg by mouth daily.    . Probiotic Product (PROBIOTIC PO) Take 1 tablet by mouth daily. PROBIOTICS 10    . ranitidine (ZANTAC) 150 MG capsule Take 300 mg by mouth daily.     . vitamin E 400 UNIT capsule Take 800 Units by mouth daily.     . metoprolol succinate (TOPROL-XL) 100 MG 24 hr tablet Take 1 tablet (100 mg total) by mouth daily. Take with or immediately following a meal. 90 tablet 3   No facility-administered medications prior to visit.     Allergies:    Allergies  Allergen Reactions  . Latex Anaphylaxis  . Penicillins Hives    Whencombined with sulfa, CAN take penicillin by itself Has patient had a PCN reaction causing immediate rash, facial/tongue/throat swelling, SOB or lightheadedness with hypotension: Yes Has patient had a PCN reaction causing severe rash involving mucus membranes or skin necrosis: No Has patient had a PCN reaction that required hospitalization: No Has patient had a PCN reaction occurring within the last 10 years: No If all of the above answers are "NO", then may proceed with Cephalosporin use.  . Sulfa Antibiotics Hives    Only when mixed sulfa and pencillin together      Review of Systems: See HPI for pertinent ROS. All other ROS negative.    Physical Exam: Blood pressure 110/64, pulse 96, temperature 98.6 F (37 C), temperature source Oral, resp. rate 18, height 6\' 1"  (1.854 m), weight 97.8 kg, SpO2 99 %., Body mass index is 28.44 kg/m. General: WNWD WM.  Appears in no acute distress. HEENT: Normocephalic, atraumatic, eyes without discharge, sclera non-icteric, nares are without discharge. Bilateral auditory canals clear, TM's are without perforation, pearly grey and translucent with reflective cone of light bilaterally.  Oral cavity moist, posterior pharynx without exudate, erythema.  Neck: Supple. No thyromegaly. No lymphadenopathy. Lungs: Clear bilaterally to auscultation without wheezes, rales, or rhonchi. Breathing is unlabored. Heart: Regular rhythm. No murmurs, rubs, or gallops. Msk:  Strength and tone normal for age. Extremities/Skin: Warm and dry. Neuro: Alert and oriented X 3. Moves all extremities spontaneously. Gait is normal. CNII-XII grossly in tact. Psych:  Responds to questions appropriately with a normal affect.     ASSESSMENT AND PLAN:  62 y.o. year old male with with   1. Bacterial respiratory infection He is to take antibiotic as directed.  Follow-up if fever worsens or persist beyond 48  hours.  Follow-up if symptoms do not resolve within 1 week after completion of antibiotic.  Also recommend to use Mucinex DM as an expectorant. - azithromycin (ZITHROMAX) 250 MG tablet; Day 1: Take 2 daily. Days 2- 5: Take 1 daily.  Dispense: 6 tablet; Refill: 0   Signed, [address] [place], [place], [facility] 05/03/2018 11:36 AM

## 2018-05-04 ENCOUNTER — Other Ambulatory Visit: Payer: Self-pay | Admitting: Gastroenterology

## 2018-05-04 DIAGNOSIS — R1011 Right upper quadrant pain: Secondary | ICD-10-CM

## 2018-05-04 DIAGNOSIS — R1013 Epigastric pain: Secondary | ICD-10-CM | POA: Diagnosis not present

## 2018-05-04 DIAGNOSIS — K219 Gastro-esophageal reflux disease without esophagitis: Secondary | ICD-10-CM | POA: Diagnosis not present

## 2018-05-04 DIAGNOSIS — R110 Nausea: Secondary | ICD-10-CM | POA: Diagnosis not present

## 2018-06-22 MED FILL — AMLODIPINE-BENAZEPRIL 5-20: 5-20 | 90 days supply | Qty: 90 | Fill #2

## 2018-06-28 ENCOUNTER — Inpatient Hospital Stay (HOSPITAL_COMMUNITY)
Admission: EM | Admit: 2018-06-28 | Discharge: 2018-07-01 | DRG: 446 | Disposition: A | Discharge status: Home / Self Care | Payer: 59 | Attending: Family Medicine | Admitting: Family Medicine

## 2018-06-28 ENCOUNTER — Emergency Department (HOSPITAL_COMMUNITY): Payer: 59

## 2018-06-28 ENCOUNTER — Other Ambulatory Visit: Payer: Self-pay

## 2018-06-28 ENCOUNTER — Encounter (HOSPITAL_COMMUNITY): Payer: Self-pay | Admitting: *Deleted

## 2018-06-28 DIAGNOSIS — I2510 Atherosclerotic heart disease of native coronary artery without angina pectoris: Secondary | ICD-10-CM | POA: Diagnosis present

## 2018-06-28 DIAGNOSIS — R1011 Right upper quadrant pain: Secondary | ICD-10-CM

## 2018-06-28 DIAGNOSIS — Z880 Allergy status to penicillin: Secondary | ICD-10-CM

## 2018-06-28 DIAGNOSIS — K219 Gastro-esophageal reflux disease without esophagitis: Secondary | ICD-10-CM | POA: Diagnosis present

## 2018-06-28 DIAGNOSIS — Z79899 Other long term (current) drug therapy: Secondary | ICD-10-CM

## 2018-06-28 DIAGNOSIS — K828 Other specified diseases of gallbladder: Secondary | ICD-10-CM | POA: Diagnosis not present

## 2018-06-28 DIAGNOSIS — Z8249 Family history of ischemic heart disease and other diseases of the circulatory system: Secondary | ICD-10-CM

## 2018-06-28 DIAGNOSIS — Z86718 Personal history of other venous thrombosis and embolism: Secondary | ICD-10-CM

## 2018-06-28 DIAGNOSIS — I10 Essential (primary) hypertension: Secondary | ICD-10-CM | POA: Diagnosis not present

## 2018-06-28 DIAGNOSIS — Z882 Allergy status to sulfonamides status: Secondary | ICD-10-CM | POA: Diagnosis not present

## 2018-06-28 DIAGNOSIS — Z91040 Latex allergy status: Secondary | ICD-10-CM

## 2018-06-28 DIAGNOSIS — R109 Unspecified abdominal pain: Secondary | ICD-10-CM | POA: Diagnosis not present

## 2018-06-28 DIAGNOSIS — Z7982 Long term (current) use of aspirin: Secondary | ICD-10-CM

## 2018-06-28 DIAGNOSIS — Z833 Family history of diabetes mellitus: Secondary | ICD-10-CM

## 2018-06-28 DIAGNOSIS — K810 Acute cholecystitis: Secondary | ICD-10-CM | POA: Diagnosis not present

## 2018-06-28 DIAGNOSIS — K760 Fatty (change of) liver, not elsewhere classified: Secondary | ICD-10-CM | POA: Diagnosis not present

## 2018-06-28 LAB — URINALYSIS, ROUTINE W REFLEX MICROSCOPIC
Bacteria, UA: NONE SEEN
Bilirubin Urine: NEGATIVE
Glucose, UA: NEGATIVE mg/dL
Hgb urine dipstick: NEGATIVE
Ketones, ur: NEGATIVE mg/dL
Leukocytes, UA: NEGATIVE
Nitrite: NEGATIVE
Protein, ur: NEGATIVE mg/dL
Specific Gravity, Urine: 1.02 (ref 1.005–1.030)
pH: 5 (ref 5.0–8.0)

## 2018-06-28 LAB — CBC
HEMATOCRIT: 42.3 % (ref 39.0–52.0)
Hemoglobin: 13.6 g/dL (ref 13.0–17.0)
MCH: 26.7 pg (ref 26.0–34.0)
MCHC: 32.2 g/dL (ref 30.0–36.0)
MCV: 83.1 fL (ref 80.0–100.0)
Platelets: 225 10*3/uL (ref 150–400)
RBC: 5.09 MIL/uL (ref 4.22–5.81)
RDW: 13.5 % (ref 11.5–15.5)
WBC: 13.3 10*3/uL — AB (ref 4.0–10.5)
nRBC: 0 % (ref 0.0–0.2)

## 2018-06-28 LAB — COMPREHENSIVE METABOLIC PANEL
ALT: 43 U/L (ref 0–44)
AST: 62 U/L — ABNORMAL HIGH (ref 15–41)
Albumin: 4.3 g/dL (ref 3.5–5.0)
Alkaline Phosphatase: 83 U/L (ref 38–126)
Anion gap: 9 (ref 5–15)
BUN: 19 mg/dL (ref 6–20)
CO2: 23 mmol/L (ref 22–32)
Calcium: 8.9 mg/dL (ref 8.9–10.3)
Chloride: 107 mmol/L (ref 98–111)
Creatinine, Ser: 1.06 mg/dL (ref 0.61–1.24)
GFR calc Af Amer: >60 mL/min (ref >60)
GFR calc non Af Amer: >60 mL/min (ref >60)
Glucose, Bld: 127 mg/dL — ABNORMAL HIGH (ref 70–99)
Potassium: 3.6 mmol/L (ref 3.5–5.1)
Sodium: 139 mmol/L (ref 135–145)
Total Bilirubin: 0.9 mg/dL (ref 0.3–1.2)
Total Protein: 7.5 g/dL (ref 6.5–8.1)

## 2018-06-28 LAB — LIPASE, BLOOD: LIPASE: 31 U/L (ref 11–51)

## 2018-06-28 LAB — I-STAT TROPONIN, ED: Troponin i, poc: 0 ng/mL (ref 0.00–0.08)

## 2018-06-28 MED ORDER — MORPHINE SULFATE (PF) 4 MG/ML IV SOLN
4.0000 mg | Freq: Once | INTRAVENOUS | Status: AC
  Administered 2018-06-28: 4 mg via INTRAVENOUS
  Filled 2018-06-28: qty 1

## 2018-06-28 MED ORDER — PROMETHAZINE HCL 25 MG/ML IJ SOLN
25.0000 mg | Freq: Once | INTRAMUSCULAR | Status: AC
  Administered 2018-06-28: 25 mg via INTRAVENOUS
  Filled 2018-06-28 (×2): qty 1

## 2018-06-28 MED ORDER — ONDANSETRON HCL 4 MG/2ML IJ SOLN: 4.0000 mg | Freq: Once | INTRAMUSCULAR | Status: DC | PRN

## 2018-06-28 NOTE — ED Notes (Signed)
US at bedside

## 2018-06-28 NOTE — ED Provider Notes (Signed)
Medical screening examination/treatment/procedure(s) were conducted as a shared visit with non-physician practitioner(s) and myself.  I personally evaluated the patient during the encounter.  Pt presents with abd pain, ruq.  Sx concerning for biliary colic.  Will start pain meds, [place], evaluate further.   EKG Interpretation  Date/Time:  Wednesday June 28 2018 21:37:54 EST Ventricular Rate:  77 PR Interval:    QRS Duration: 107 QT Interval:  384 QTC Calculation: 435 R Axis:   77 Text Interpretation:  Sinus rhythm Abnormal T, consider ischemia, lateral leads No significant change since last tracing Confirmed by Dorie Rank [phone]) on 06/28/2018 9:49:26 PM Also confirmed by Dorie Rank [phone]), editor Shon Hale [phone])  on 06/29/2018 11:07:04 AM         Dorie Rank, MD 07/03/18 [phone]

## 2018-06-28 NOTE — ED Triage Notes (Signed)
Pt c/o abdominal pain starting this am after eating sausage biscuit, pt sts had a similar episode last month and it was his gallbladder,pt was given Pepcid IV which helped pain.

## 2018-06-28 NOTE — ED Provider Notes (Signed)
[facility] DEPT Provider Note   CSN: [id number] Arrival date & time: 06/28/18  1942     History   Chief Complaint Chief Complaint  Patient presents with  . Abdominal Pain    HPI Lawrence Liu is a 62 y.o. 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  The patient endorses constant, worsening, 6-7/10, upper abdominal pain that began suddenly earlier today.  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  No other known aggravating or alleviating factors.  He also recalls eating a sausage biscuit prior to the onset of pain.  He reports associated nausea, but denies fever, chills, dysuria, hematuria, vomiting, diarrhea, constipation so far.  He states he is not really been able to eat or drink since the onset of pain.  No treatment prior to arrival.  He reports that he was seen in the ER several months ago with similar symptoms.  They thought he was having issues with his gallbladder so he followed up with Dr. Collene Mares with GI.  He is scheduled for an outpatient nuclear study on December 10.  He denies chest pain, dyspnea, rash, numbness, weakness, visual changes, headache.  He reports minimal alcohol use.  He is a never smoker.  The history is provided by the patient. No language interpreter was used.    Past Medical History:  Diagnosis Date  . Alpha galactosidase deficiency    multiple tick bites  . Deep vein thrombosis ([place]) 2004   Left deep vein thrombosis of left calf  . GERD (gastroesophageal reflux disease)   . History of blood transfusion    as a baby for jaundice  . Hypertension   . 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 No  lesions greater than 30% noted. - confirmed by Cor CTA  . Pneumonia 1980's   walking pneumonia  . Status post ascending aortic aneurysm repair 08/2016   Dr. Cyndia Bent  . Thoracic ascending aortic aneurysm ([place]) 11/06/2013   CTA of chest November 2017 showed notable increase in size --> s/p Open (Valve Sparing) Repair Jan 2019)    Patient Active Problem List   Diagnosis Date Noted  . Acute acalculous cholecystitis 06/29/2018  . Postoperative anemia 09/10/2017  . Postoperative atrial fibrillation ([place]) 09/08/2017  . S/P aortic aneurysm repair 08/15/2017  . Essential hypertension 10/01/2016  . Aortic root dilatation ([place]) 11/06/2013  . 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 PVC's (premature ventricular contractions) 10/02/2013  . History of DVT (deep vein thrombosis) 10/02/2013  . GERD (gastroesophageal reflux disease)   . Arthritis   . Deep vein thrombosis [facility])     Past Surgical History:  Procedure Laterality Date  . ASCENDING AORTIC ROOT REPLACEMENT N/A 08/15/2017   Procedure: ASCENDING AORTIC ROOT REPLACEMENT, (VALVE SPARING ROOT REPLACEMENT);  Surgeon: [name], MD;  Location: [facility];  Service: Open Heart Surgery;  Laterality: N/A;  . COLONOSCOPY WITH PROPOFOL N/A 11/23/2016   Procedure: COLONOSCOPY WITH PROPOFOL;  Surgeon: Juanita Craver, MD;  Location: [facility];  Service: Endoscopy;  Laterality: N/A;  . colonscopy  5 1/2 yrs ago   polyps removed  . EYE SURGERY Bilateral   . LEFT HEART CATHETERIZATION WITH CORONARY ANGIOGRAM N/A 10/03/2013   Procedure: LEFT HEART  CATHETERIZATION WITH CORONARY ANGIOGRAM;  Surgeon: Jolaine Artist, MD;  Location: [facility] CATH LAB;  Service: Cardiovascular:  Nonobstructive disease - LAD diffuse 20-30%. Small distal vessel. Circumflex ostial 30-40%. Large OM 1 with 30% mid. Small OM 2 and several small PL branches. Large dominant RCA with 30% mid and diffuse 20-30% distal.  . SHOULDER ARTHROSCOPY  07/15/2011   Procedure: ARTHROSCOPY SHOULDER;   Surgeon: Metta Clines Supple;  Location: [facility];  Service: Orthopedics;  Laterality: Right;  RIGHT ARTHROSCOPY SUBACROMIAL DECOMPRESSION AND DISTAL CLAVICLE RESECTION  . TEE WITHOUT CARDIOVERSION N/A 08/15/2017   Procedure: TRANSESOPHAGEAL ECHOCARDIOGRAM (TEE);  Surgeon: [name], MD;  Location: [facility];  Service: Open Heart Surgery;  Laterality: N/A;  . TONSILLECTOMY  ~ 1970   adenoids also  . TRANSTHORACIC ECHOCARDIOGRAM  March 2015   Upper limit size LV. Mild LVH. EF 60%. Normal wall motion. Ascending aorta measures 48 mm at root tapering to 40 mm at proximal ascending. Mild RV dilation.        Home Medications    Prior to Admission medications   Medication Sig Start Date End Date Taking? Authorizing Provider  amLODipine-benazepril (LOTREL) 5-20 MG capsule Take 1 capsule by mouth daily. 11/09/17  Yes [name], MD  Ascorbic Acid (VITAMIN C) 1000 MG tablet Take 1,000 mg by mouth daily.   Yes [provider]  aspirin EC 325 MG EC tablet Take 1 tablet (325 mg total) by mouth daily. 08/22/17  Yes Barrett, Erin R, PA-C  atorvastatin (LIPITOR) 20 MG tablet TAKE 1 TABLET (20 MG TOTAL) BY MOUTH DAILY. 12/12/17  Yes Leonie Man, MD  Cholecalciferol (VITAMIN D3) 5000 units CAPS Take 5,000 Units by mouth daily.   Yes [provider]  Coenzyme Q10 (CO Q-10) 300 MG CAPS Take 300 mg by mouth daily.   Yes [provider]  Cyanocobalamin (VITAMIN B-12) 5000 MCG TBDP Take 5,000 mcg by mouth daily.    Yes [provider]  famotidine (PEPCID) 20 MG tablet Take 20 mg by mouth daily.   Yes [provider]  fexofenadine (ALLEGRA) 180 MG tablet Take 180 mg by mouth daily.   Yes [provider]  metoprolol succinate (TOPROL-XL) 100 MG 24 hr tablet Take 100 mg by mouth daily.  03/28/18  Yes [provider]  Probiotic Product (PROBIOTIC PO) Take 1 tablet by mouth daily. PROBIOTICS 10   Yes [provider]  vitamin E 400 UNIT capsule  Take 800 Units by mouth daily.    Yes [provider]  acetaminophen (TYLENOL) 500 MG tablet Take 500 mg by mouth every 6 (six) hours as needed for moderate pain or headache.    [provider]  azithromycin (ZITHROMAX) 250 MG tablet Day 1: Take 2 daily. Days 2- 5: Take 1 daily. Patient not taking: Reported on 06/28/2018 05/03/18   Dena Billet B, PA-C  metoprolol succinate (TOPROL-XL) 100 MG 24 hr tablet Take 1 tablet (100 mg total) by mouth daily. Take with or immediately following a meal. 09/08/17 03/22/18  Leonie Man, MD    Family History Family History  Problem Relation Age of Onset  . Heart disease Father   . Diabetes Father   . Diabetes Mother     Social History Social History   Tobacco Use  . Smoking status: Never Smoker  . Smokeless tobacco: Never Used  Substance Use Topics  . Alcohol use: Yes    Comment: 10/02/2013 "hard apple cider maybe once/month"  . Drug  use: No     Allergies   Latex; Penicillins; and Sulfa antibiotics   Review of Systems Review of Systems  Constitutional: Negative for appetite change, chills and fever.  HENT: Negative for congestion.   Respiratory: Negative for shortness of breath and wheezing.   Cardiovascular: Negative for chest pain, palpitations and leg swelling.  Gastrointestinal: Positive for abdominal pain and nausea. Negative for constipation, diarrhea and vomiting.  Genitourinary: Negative for dysuria and urgency.  Musculoskeletal: Negative for back pain, gait problem, joint swelling, myalgias, neck pain and neck stiffness.  Skin: Negative for rash.  Allergic/Immunologic: Negative for immunocompromised state.  Neurological: Negative for weakness, numbness and headaches.  Psychiatric/Behavioral: Negative for confusion.     Physical Exam Updated Vital Signs BP 129/89 (BP Location: Right Arm)   Pulse 71   Temp 98.2 F (36.8 C) (Oral)   Resp 16   Ht 6\' 1"  (1.854 m)   Wt 95.1 kg   SpO2 97%   BMI 27.66 kg/m    Physical Exam  Constitutional: He appears well-developed.  Uncomfortable appearing.  HENT:  Head: Normocephalic.  Eyes: Conjunctivae are normal.  Neck: Neck supple.  Cardiovascular: Normal rate, regular rhythm and intact distal pulses. Exam reveals no gallop and no friction rub.  Murmur heard. Soft systolic murmur.   Pulmonary/Chest: Effort normal and breath sounds normal. No stridor. No respiratory distress. He has no wheezes. He has no rales. He exhibits no tenderness.  Abdominal: Soft. Bowel sounds are normal. He exhibits no distension and no mass. There is tenderness. There is guarding. There is no rebound. No hernia.  Abdomen is distended, but mostly soft.  Tender to palpation in the epigastric and maximally in the right upper quadrant.  Positive Murphy sign.  Negative over McBurney's point.  No CVA tenderness bilaterally.  No rebound.   Musculoskeletal: Normal range of motion. He exhibits no edema, tenderness or deformity.  Neurological: He is alert.  Skin: Skin is warm and dry.  Psychiatric: His behavior is normal.  Nursing note and vitals reviewed.    ED Treatments / Results  Labs (all labs ordered are listed, but only abnormal results are displayed) Labs Reviewed  COMPREHENSIVE METABOLIC PANEL - Abnormal; Notable for the following components:      Result Value   Glucose, Bld 127 (*)    AST 62 (*)    All other components within normal limits  CBC - Abnormal; Notable for the following components:   WBC 13.3 (*)    All other components within normal limits  LIPASE, BLOOD  URINALYSIS, ROUTINE W REFLEX MICROSCOPIC  HIV ANTIBODY (ROUTINE TESTING W REFLEX)  COMPREHENSIVE METABOLIC PANEL  CBC  I-STAT TROPONIN, ED    EKG EKG Interpretation  Date/Time:  Wednesday June 28 2018 21:37:54 EST Ventricular Rate:  77 PR Interval:    QRS Duration: 107 QT Interval:  384 QTC Calculation: 435 R Axis:   77 Text Interpretation:  Sinus rhythm Abnormal T, consider ischemia,  lateral leads No significant change since last tracing Confirmed by Dorie Rank [phone]) on 06/28/2018 9:49:26 PM   Radiology Dg Chest 2 View  Result Date: 06/28/2018 CLINICAL DATA:  62 y/o  M; abdominal pain after eating. EXAM: CHEST - 2 VIEW COMPARISON:  03/21/2018 chest radiograph. FINDINGS: Stable cardiac silhouette within normal limits given projection and technique. Post median sternotomy with wires in alignment. Clear lungs. No pleural effusion or pneumothorax. No acute osseous abnormality is evident. IMPRESSION: No acute pulmonary process identified. Electronically Signed   By: Kemba [place]  [place] M.D.   On: 06/28/2018 22:00   US Abdomen Limited Ruq  Result Date: 06/28/2018 CLINICAL DATA:  Right upper quadrant abdominal pain EXAM: ULTRASOUND ABDOMEN LIMITED RIGHT UPPER QUADRANT COMPARISON:  03/22/2018 right upper quadrant abdominal sonogram FINDINGS: Gallbladder: No gallstones. Mild layering sludge. Mild diffuse gallbladder wall thickening and mild gallbladder distention with sonographic Murphy sign reported. No pericholecystic fluid. Common bile duct: Diameter: 4 mm Liver: Liver parenchyma is diffusely echogenic with mild posterior acoustic attenuation. No definite liver surface irregularity. No liver mass, noting decreased sensitivity in the setting of an echogenic liver. Portal vein is patent on color Doppler imaging with normal direction of blood flow towards the liver. IMPRESSION: 1. No cholelithiasis. Mild gallbladder sludge. Mild diffuse gallbladder wall thickening. Mild gallbladder distention. Sonographic Murphy sign reported. No pericholecystic fluid. These sonographic findings are nonspecific. Acute acalculous cholecystitis is unlikely in the emergency department setting. If there is clinical concern for acute cholecystitis, hepatobiliary scintigraphy study could be obtained. 2. No biliary ductal dilatation. 3. Diffuse hepatic steatosis. Electronically Signed   By: Ilona Sorrel M.D.    On: 06/28/2018 21:43    Procedures Procedures (including critical care time)  Medications Ordered in ED Medications  heparin injection 5,000 Units (has no administration in time range)  dextrose 5 %-0.45 % sodium chloride infusion ( Intravenous New Bag/Given 06/29/18 0119)  ondansetron (ZOFRAN) tablet 4 mg (has no administration in time range)    Or  ondansetron (ZOFRAN) injection 4 mg (has no administration in time range)  ketorolac (TORADOL) 30 MG/ML injection 30 mg (has no administration in time range)  meropenem (MERREM) 1 g in sodium chloride 0.9 % 100 mL IVPB (1 g Intravenous New Bag/Given 06/29/18 0120)  labetalol (NORMODYNE,TRANDATE) injection 5 mg (has no administration in time range)  pantoprazole (PROTONIX) injection 40 mg (40 mg Intravenous Given 06/29/18 0121)  promethazine (PHENERGAN) injection 25 mg (25 mg Intravenous Given 06/28/18 2058)  morphine 4 MG/ML injection 4 mg (4 mg Intravenous Given 06/28/18 2112)     Initial Impression / Assessment and Plan / ED Course  I have reviewed the triage vital signs and the nursing notes.  Pertinent labs & imaging results that were available during my care of the patient were reviewed by me and considered in my medical decision making (see chart for details).     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  States pain began after eating a sausage biscuit earlier today was exacerbated after lifting a heavy patient while he was at work.  He is followed by Dr. Collene Mares with GI as scheduled for an outpatient nuclear med test on December 10.  On initial exam, patient is very uncomfortable.  Phenergan and morphine given and on reexamination the patient is much more comfortable.  On reexamination of the abdomen, pain is maximal in the right upper quadrant.  #4 mild  leukocytosis of 13 and mild increase in AST at 62.  Cardiac work-up is reassuring.  Right upper quadrant ultrasound with mildly distended gallbladder with sonographic Murphy sign, mild layering sludge, but no gallstones or pericholecystic fluid.  The patient was discussed with Dr. Lucia Gaskins, general surgery, who recommends medical admission with nuclear medicine test.  General surgery will follow.  Consult to the hospitalist team spoke with Dr. Jonelle Sidle who will accept the patient for admission. The patient appears reasonably stabilized  for admission considering the current resources, flow, and capabilities available in the ED at this time, and I doubt any other [facility] requiring further screening and/or treatment in the ED prior to admission.  Final Clinical Impressions(s) / ED Diagnoses   Final diagnoses:  RUQ abdominal pain    ED Discharge Orders    None       Joanne Gavel, PA-C 06/29/18 0134    Dorie Rank, MD 07/03/18 1626

## 2018-06-29 ENCOUNTER — Other Ambulatory Visit: Payer: Self-pay

## 2018-06-29 DIAGNOSIS — Z7982 Long term (current) use of aspirin: Secondary | ICD-10-CM | POA: Diagnosis not present

## 2018-06-29 DIAGNOSIS — I10 Essential (primary) hypertension: Secondary | ICD-10-CM | POA: Diagnosis not present

## 2018-06-29 DIAGNOSIS — Z91040 Latex allergy status: Secondary | ICD-10-CM | POA: Diagnosis not present

## 2018-06-29 DIAGNOSIS — K828 Other specified diseases of gallbladder: Secondary | ICD-10-CM | POA: Diagnosis present

## 2018-06-29 DIAGNOSIS — Z8249 Family history of ischemic heart disease and other diseases of the circulatory system: Secondary | ICD-10-CM | POA: Diagnosis not present

## 2018-06-29 DIAGNOSIS — Z880 Allergy status to penicillin: Secondary | ICD-10-CM | POA: Diagnosis not present

## 2018-06-29 DIAGNOSIS — R948 Abnormal results of function studies of other organs and systems: Secondary | ICD-10-CM | POA: Diagnosis not present

## 2018-06-29 DIAGNOSIS — Z86718 Personal history of other venous thrombosis and embolism: Secondary | ICD-10-CM | POA: Diagnosis not present

## 2018-06-29 DIAGNOSIS — Z882 Allergy status to sulfonamides status: Secondary | ICD-10-CM | POA: Diagnosis not present

## 2018-06-29 DIAGNOSIS — Z833 Family history of diabetes mellitus: Secondary | ICD-10-CM | POA: Diagnosis not present

## 2018-06-29 DIAGNOSIS — I2510 Atherosclerotic heart disease of native coronary artery without angina pectoris: Secondary | ICD-10-CM | POA: Diagnosis present

## 2018-06-29 DIAGNOSIS — Z79899 Other long term (current) drug therapy: Secondary | ICD-10-CM | POA: Diagnosis not present

## 2018-06-29 DIAGNOSIS — R1011 Right upper quadrant pain: Secondary | ICD-10-CM | POA: Diagnosis present

## 2018-06-29 DIAGNOSIS — K810 Acute cholecystitis: Secondary | ICD-10-CM

## 2018-06-29 DIAGNOSIS — K219 Gastro-esophageal reflux disease without esophagitis: Secondary | ICD-10-CM | POA: Diagnosis not present

## 2018-06-29 LAB — COMPREHENSIVE METABOLIC PANEL
ALBUMIN: 3.7 g/dL (ref 3.5–5.0)
ALT: 98 U/L — ABNORMAL HIGH (ref 0–44)
AST: 111 U/L — AB (ref 15–41)
Alkaline Phosphatase: 84 U/L (ref 38–126)
Anion gap: 6 (ref 5–15)
BUN: 18 mg/dL (ref 6–20)
CHLORIDE: 112 mmol/L — AB (ref 98–111)
CO2: 23 mmol/L (ref 22–32)
CREATININE: 1 mg/dL (ref 0.61–1.24)
Calcium: 8.5 mg/dL — ABNORMAL LOW (ref 8.9–10.3)
GFR calc Af Amer: >60 mL/min (ref >60)
Glucose, Bld: 116 mg/dL — ABNORMAL HIGH (ref 70–99)
POTASSIUM: 3.7 mmol/L (ref 3.5–5.1)
Sodium: 141 mmol/L (ref 135–145)
Total Bilirubin: 1.1 mg/dL (ref 0.3–1.2)
Total Protein: 6.3 g/dL — ABNORMAL LOW (ref 6.5–8.1)

## 2018-06-29 LAB — CBC
HEMATOCRIT: 41 % (ref 39.0–52.0)
HEMOGLOBIN: 12.8 g/dL — AB (ref 13.0–17.0)
MCH: 25.9 pg — AB (ref 26.0–34.0)
MCHC: 31.2 g/dL (ref 30.0–36.0)
MCV: 83 fL (ref 80.0–100.0)
Platelets: 191 10*3/uL (ref 150–400)
RBC: 4.94 MIL/uL (ref 4.22–5.81)
RDW: 13.6 % (ref 11.5–15.5)
WBC: 6.2 10*3/uL (ref 4.0–10.5)
nRBC: 0 % (ref 0.0–0.2)

## 2018-06-29 MED ORDER — SODIUM CHLORIDE 0.9 % IV SOLN
1.0000 g | Freq: Three times a day (TID) | INTRAVENOUS | Status: DC
  Administered 2018-06-29 – 2018-06-30 (×4): 1 g via INTRAVENOUS
  Filled 2018-06-29 (×5): qty 1

## 2018-06-29 MED ORDER — ONDANSETRON HCL 4 MG/2ML IJ SOLN: 4.0000 mg | Freq: Four times a day (QID) | INTRAMUSCULAR | Status: DC | PRN

## 2018-06-29 MED ORDER — KETOROLAC TROMETHAMINE 30 MG/ML IJ SOLN
30.0000 mg | Freq: Four times a day (QID) | INTRAMUSCULAR | Status: DC | PRN
  Administered 2018-06-30: 30 mg via INTRAVENOUS
  Filled 2018-06-29: qty 1

## 2018-06-29 MED ORDER — PANTOPRAZOLE SODIUM 40 MG IV SOLR
40.0000 mg | INTRAVENOUS | Status: DC
  Administered 2018-06-29 – 2018-07-01 (×3): 40 mg via INTRAVENOUS
  Filled 2018-06-29 (×3): qty 40

## 2018-06-29 MED ORDER — ONDANSETRON HCL 4 MG PO TABS: 4.0000 mg | ORAL_TABLET | Freq: Four times a day (QID) | ORAL | Status: DC | PRN

## 2018-06-29 MED ORDER — SODIUM CHLORIDE 0.9 % IV SOLN
1.0000 g | INTRAVENOUS | Status: AC
  Administered 2018-06-29: 1 g via INTRAVENOUS
  Filled 2018-06-29: qty 1

## 2018-06-29 MED ORDER — DEXTROSE-NACL 5-0.45 % IV SOLN
INTRAVENOUS | Status: DC
  Administered 2018-06-29 – 2018-07-01 (×5): via INTRAVENOUS

## 2018-06-29 MED ORDER — LABETALOL HCL 5 MG/ML IV SOLN
5.0000 mg | INTRAVENOUS | Status: DC | PRN
  Filled 2018-06-29: qty 4

## 2018-06-29 MED ORDER — HEPARIN SODIUM (PORCINE) 5000 UNIT/ML IJ SOLN
5000.0000 [IU] | Freq: Three times a day (TID) | INTRAMUSCULAR | Status: DC
  Administered 2018-06-29 – 2018-07-01 (×7): 5000 [IU] via SUBCUTANEOUS
  Filled 2018-06-29 (×5): qty 1

## 2018-06-29 NOTE — H&P (Signed)
History and Physical   BARACK NICODEMUS NWG:[id number] DOB: [birth year] DOA: 06/28/2018  Referring MD/NP/PA: Dr. Clover Mealy  PCP: [name], MD   Outpatient Specialists: None  Patient coming from: Home  Chief Complaint: Abdominal pain  HPI: Lawrence Liu is a 62 y.o. male with medical history significant of hypertension, DVT, GERD, thoracic ascending aortic aneurysm status post sternotomy and valve sparing aortic surgery, nonobstructive coronary artery disease with previous A. fib postoperatively and aortic root dilatation who presents to the ER with abdominal pain that happens suddenly starting today.  Pain was rated as 6-7 out of 10.  Mainly in the epigastric to right upper quadrant.  Associated with some nausea but no vomiting no diarrhea.  Patient has not been able to eat or drink all day.  He came to the ER where he was evaluated.  He has some right upper quadrant abdominal tenderness.  Evaluation so far suggested a calculus cholecystitis.  Surgery involved and patient is to get nuclear medicine study to determine Of action.  He is being admitted to the hospital for treatment..  ED Course: Temperature is 97.5 with blood pressure 149/90 pulse 80 respirate of 20 oxygen sat 98% on room air.  White count is 13.3 hemoglobin 13.6 platelet 225.  Chemistry appeared to be all within normal except for glucose of 127.  LFTs showed nonobstructive pattern.  Chest x-ray showed no active disease.  Abdominal ultrasound limited showed No cholelithiasis. Mild gallbladder sludge. Mild diffuse gallbladder wall thickening. Mild gallbladder distention. Sonographic Murphy sign reported. No pericholecystic fluid. These sonographic findings are nonspecific. Acute acalculous cholecystitis is unlikely in the emergency department setting. If there is clinical concern for acute cholecystitis, hepatobiliary scintigraphy tudy could be obtained  Review of Systems: As per HPI otherwise 10 point review of systems  negative.    Past Medical History:  Diagnosis Date  . Alpha galactosidase deficiency    multiple tick bites  . Deep vein thrombosis ([place]) 2004   Left deep vein thrombosis of left calf  . GERD (gastroesophageal reflux disease)   . History of blood transfusion    as a baby for jaundice  . Hypertension   . 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 No lesions greater than 30% noted. - confirmed by Cor CTA  . Pneumonia 1980's   walking pneumonia  . Status post ascending aortic aneurysm repair 08/2016   Dr. Cyndia Bent  . Thoracic ascending aortic aneurysm ([place]) 11/06/2013   CTA of chest November 2017 showed notable increase in size --> s/p Open (Valve Sparing) Repair Jan 2019)    Past Surgical History:  Procedure Laterality Date  . ASCENDING AORTIC ROOT REPLACEMENT N/A 08/15/2017   Procedure: ASCENDING AORTIC ROOT REPLACEMENT, (VALVE SPARING ROOT REPLACEMENT);  Surgeon: [name], MD;  Location: [facility];  Service: Open Heart Surgery;  Laterality: N/A;  . COLONOSCOPY WITH PROPOFOL N/A 11/23/2016   Procedure: COLONOSCOPY WITH PROPOFOL;  Surgeon: Juanita Craver, MD;  Location: [facility];  Service: Endoscopy;  Laterality: N/A;  . colonscopy  5 1/2 yrs ago   polyps removed  . EYE SURGERY Bilateral   . LEFT HEART CATHETERIZATION WITH CORONARY ANGIOGRAM N/A 10/03/2013   Procedure: LEFT HEART CATHETERIZATION WITH CORONARY ANGIOGRAM;  Surgeon: Jolaine Artist, MD;  Location: [facility] CATH LAB;  Service: Cardiovascular:  Nonobstructive disease - LAD diffuse 20-30%. Small distal vessel. Circumflex ostial 30-40%. Large OM 1 with 30% mid. Small OM 2 and several small PL branches. Large  dominant RCA with 30% mid and diffuse 20-30% distal.  . SHOULDER ARTHROSCOPY  07/15/2011   Procedure: ARTHROSCOPY SHOULDER;  Surgeon: Metta Clines Supple;  Location: [place];  Service: Orthopedics;  Laterality: Right;  RIGHT ARTHROSCOPY SUBACROMIAL DECOMPRESSION AND DISTAL CLAVICLE RESECTION    . TEE WITHOUT CARDIOVERSION N/A 08/15/2017   Procedure: TRANSESOPHAGEAL ECHOCARDIOGRAM (TEE);  Surgeon: [name], MD;  Location: [place];  Service: Open Heart Surgery;  Laterality: N/A;  . TONSILLECTOMY  ~ 1970   adenoids also  . TRANSTHORACIC ECHOCARDIOGRAM  March 2015   Upper limit size LV. Mild LVH. EF 60%. Normal wall motion. Ascending aorta measures 48 mm at root tapering to 40 mm at proximal ascending. Mild RV dilation.     reports that he has never smoked. He has never used smokeless tobacco. He reports that he drinks alcohol. He reports that he does not use drugs.  Allergies  Allergen Reactions  . Latex Anaphylaxis  . Penicillins Hives    Whencombined with sulfa, CAN take penicillin by itself Has patient had a PCN reaction causing immediate rash, facial/tongue/throat swelling, SOB or lightheadedness with hypotension: Yes Has patient had a PCN reaction causing severe rash involving mucus membranes or skin necrosis: No Has patient had a PCN reaction that required hospitalization: No Has patient had a PCN reaction occurring within the last 10 years: No If all of the above answers are "NO", then may proceed with Cephalosporin use.  . Sulfa Antibiotics Hives    Only when mixed sulfa and pencillin together    Family History  Problem Relation Age of Onset  . Heart disease Father   . Diabetes Father   . Diabetes Mother      Prior to Admission medications   Medication Sig Start Date End Date Taking? Authorizing Provider  amLODipine-benazepril (LOTREL) 5-20 MG capsule Take 1 capsule by mouth daily. 11/09/17  Yes [name], MD  Ascorbic Acid (VITAMIN C) 1000 MG tablet Take 1,000 mg by mouth daily.   Yes [provider]  aspirin EC 325 MG EC tablet Take 1 tablet (325 mg total) by mouth daily. 08/22/17  Yes Barrett, Erin R, PA-C  atorvastatin (LIPITOR) 20 MG tablet TAKE 1 TABLET (20 MG TOTAL) BY MOUTH DAILY. 12/12/17  Yes Leonie Man, MD  Cholecalciferol  (VITAMIN D3) 5000 units CAPS Take 5,000 Units by mouth daily.   Yes [provider]  Coenzyme Q10 (CO Q-10) 300 MG CAPS Take 300 mg by mouth daily.   Yes [provider]  Cyanocobalamin (VITAMIN B-12) 5000 MCG TBDP Take 5,000 mcg by mouth daily.    Yes [provider]  famotidine (PEPCID) 20 MG tablet Take 20 mg by mouth daily.   Yes [provider]  fexofenadine (ALLEGRA) 180 MG tablet Take 180 mg by mouth daily.   Yes [provider]  metoprolol succinate (TOPROL-XL) 100 MG 24 hr tablet Take 100 mg by mouth daily.  03/28/18  Yes [provider]  Probiotic Product (PROBIOTIC PO) Take 1 tablet by mouth daily. PROBIOTICS 10   Yes [provider]  vitamin E 400 UNIT capsule Take 800 Units by mouth daily.    Yes [provider]  acetaminophen (TYLENOL) 500 MG tablet Take 500 mg by mouth every 6 (six) hours as needed for moderate pain or headache.    [provider]  azithromycin (ZITHROMAX) 250 MG tablet Day 1: Take 2 daily. Days 2- 5: Take 1 daily. Patient not taking: Reported on 06/28/2018  05/03/18   Dena Billet B, PA-C  metoprolol succinate (TOPROL-XL) 100 MG 24 hr tablet Take 1 tablet (100 mg total) by mouth daily. Take with or immediately following a meal. 09/08/17 03/22/18  Leonie Man, MD    Physical Exam: Vitals:   06/28/18 1955 06/28/18 2227 06/28/18 2300  BP: (!) 149/90 109/66 118/77  Pulse: 70 80 76  Resp: 20 19 18   Temp: (!) 97.5 F (36.4 C)    TempSrc: Oral    SpO2: 98% 94% 95%      Constitutional: NAD, calm, comfortable Vitals:   06/28/18 1955 06/28/18 2227 06/28/18 2300  BP: (!) 149/90 109/66 118/77  Pulse: 70 80 76  Resp: 20 19 18   Temp: (!) 97.5 F (36.4 C)    TempSrc: Oral    SpO2: 98% 94% 95%   Eyes: PERRL, lids and conjunctivae normal ENMT: Mucous membranes are moist. Posterior pharynx clear of any exudate or lesions.Normal dentition.  Neck: normal, supple, no masses, no  thyromegaly Respiratory: clear to auscultation bilaterally, no wheezing, no crackles. Normal respiratory effort. No accessory muscle use.  Cardiovascular: Regular rate and rhythm, no murmurs / rubs / gallops. No extremity edema. 2+ pedal pulses. No carotid bruits.  Abdomen: RUQ abdominal tenderness, no masses palpated. No hepatosplenomegaly. Bowel sounds positive.  Musculoskeletal: no clubbing / cyanosis. No joint deformity upper and lower extremities. Good ROM, no contractures. Normal muscle tone.  Skin: no rashes, lesions, ulcers. No induration Neurologic: CN 2-12 grossly intact. Sensation intact, DTR normal. Strength 5/5 in all 4.  Psychiatric: Normal judgment and insight. Alert and oriented x 3. Normal mood.     Labs on Admission: I have personally reviewed following labs and imaging studies  CBC: Recent Labs  Lab 06/28/18 2113  WBC 13.3*  HGB 13.6  HCT 42.3  MCV 83.1  PLT 132   Basic Metabolic Panel: Recent Labs  Lab 06/28/18 2113  NA 139  K 3.6  CL 107  CO2 23  GLUCOSE 127*  BUN 19  CREATININE 1.06  CALCIUM 8.9   GFR: CrCl cannot be calculated (Unknown ideal weight.). Liver Function Tests: Recent Labs  Lab 06/28/18 2113  AST 62*  ALT 43  ALKPHOS 83  BILITOT 0.9  PROT 7.5  ALBUMIN 4.3   Recent Labs  Lab 06/28/18 2113  LIPASE 31   No results for input(s): AMMONIA in the last 168 hours. Coagulation Profile: No results for input(s): INR, PROTIME in the last 168 hours. Cardiac Enzymes: No results for input(s): CKTOTAL, CKMB, CKMBINDEX, TROPONINI in the last 168 hours. BNP (last 3 results) No results for input(s): PROBNP in the last 8760 hours. HbA1C: No results for input(s): HGBA1C in the last 72 hours. CBG: No results for input(s): GLUCAP in the last 168 hours. Lipid Profile: No results for input(s): CHOL, HDL, LDLCALC, TRIG, CHOLHDL, LDLDIRECT in the last 72 hours. Thyroid Function Tests: No results for input(s): TSH, T4TOTAL, FREET4, T3FREE,  THYROIDAB in the last 72 hours. Anemia Panel: No results for input(s): VITAMINB12, FOLATE, FERRITIN, TIBC, IRON, RETICCTPCT in the last 72 hours. Urine analysis:    Component Value Date/Time   COLORURINE YELLOW 06/28/2018 2252   APPEARANCEUR CLEAR 06/28/2018 2252   LABSPEC 1.020 06/28/2018 2252   PHURINE 5.0 06/28/2018 2252   GLUCOSEU NEGATIVE 06/28/2018 2252   HGBUR NEGATIVE 06/28/2018 2252   BILIRUBINUR NEGATIVE 06/28/2018 2252   KETONESUR NEGATIVE 06/28/2018 2252   PROTEINUR NEGATIVE 06/28/2018 2252   UROBILINOGEN 0.2 08/18/2010 2332   NITRITE NEGATIVE 06/28/2018  [place] 06/28/2018 2252   Sepsis Labs: @LABRCNTIP (procalcitonin:4,lacticidven:4) )No results found for this or any previous visit (from the past 240 hour(s)).   Radiological Exams on Admission: Dg Chest 2 View  Result Date: 06/28/2018 CLINICAL DATA:  62 y/o  M; abdominal pain after eating. EXAM: CHEST - 2 VIEW COMPARISON:  03/21/2018 chest radiograph. FINDINGS: Stable cardiac silhouette within normal limits given projection and technique. Post median sternotomy with wires in alignment. Clear lungs. No pleural effusion or pneumothorax. No acute osseous abnormality is evident. IMPRESSION: No acute pulmonary process identified. Electronically Signed   By: Kristine Garbe M.D.   On: 06/28/2018 22:00   US Abdomen Limited Ruq  Result Date: 06/28/2018 CLINICAL DATA:  Right upper quadrant abdominal pain EXAM: ULTRASOUND ABDOMEN LIMITED RIGHT UPPER QUADRANT COMPARISON:  03/22/2018 right upper quadrant abdominal sonogram FINDINGS: Gallbladder: No gallstones. Mild layering sludge. Mild diffuse gallbladder wall thickening and mild gallbladder distention with sonographic Murphy sign reported. No pericholecystic fluid. Common bile duct: Diameter: 4 mm Liver: Liver parenchyma is diffusely echogenic with mild posterior acoustic attenuation. No definite liver surface irregularity. No liver mass, noting  decreased sensitivity in the setting of an echogenic liver. Portal vein is patent on color Doppler imaging with normal direction of blood flow towards the liver. IMPRESSION: 1. No cholelithiasis. Mild gallbladder sludge. Mild diffuse gallbladder wall thickening. Mild gallbladder distention. Sonographic Murphy sign reported. No pericholecystic fluid. These sonographic findings are nonspecific. Acute acalculous cholecystitis is unlikely in the emergency department setting. If there is clinical concern for acute cholecystitis, hepatobiliary scintigraphy study could be obtained. 2. No biliary ductal dilatation. 3. Diffuse hepatic steatosis. Electronically Signed   By: Ilona Sorrel M.D.   On: 06/28/2018 21:43    EKG: Independently reviewed.  EKG showed normal sinus rhythm with nonspecific ST changes.  Assessment/Plan Principal Problem:   Acute acalculous cholecystitis Active Problems:   GERD (gastroesophageal reflux disease)   History of DVT (deep vein thrombosis)   Essential hypertension     #1 possible acalculous cholecystitis: Clinically suspected cholecystitis.  Patient will be admitted.  Nuclear medicine HIDA scan will be ordered for the morning.  If abnormal patient most likely will require laparoscopic cholecystectomy.  In the meantime pain management with Toradol avoiding narcotics.  Nausea management as well as empiric antibiotics with meropenem until tomorrow.  #2 GERD: IV Protonix will be ordered.  Once patient is back on oral medication will resume home therapy.  #3 hypertension: Blood pressure is normal to low at the moment.  IV labetalol as needed will be ordered.  #4 history of DVT: Off anticoagulation.  DVT prophylaxis with heparin will be ordered.  #5 leukocytosis: Most likely related to #1 above.  Monitor white count   DVT prophylaxis: Heparin Code Status: Full code Family Communication: Wife at the bedside Disposition Plan: Home Consults called: General surgery Dr.  Lucia Gaskins Admission status: Inpatient  Severity of Illness: The appropriate patient status for this patient is INPATIENT. Inpatient status is judged to be reasonable and necessary in order to provide the required intensity of service to ensure the patient's safety. The patient's presenting symptoms, physical exam findings, and initial radiographic and laboratory data in the context of their chronic comorbidities is felt to place them at high risk for further clinical deterioration. Furthermore, it is not anticipated that the patient will be medically stable for discharge from the hospital within 2 midnights of admission. The following factors support the patient status of inpatient.   " The patient's  presenting symptoms include abdominal pain with anorexia. " The worrisome physical exam findings include right upper quadrant abdominal tenderness. " The initial radiographic and laboratory data are worrisome because of ultrasound suggestive of a calculus cholecystitis. " The chronic co-morbidities include hypertension and thoracic aortic aneurysm.   * I certify that at the point of admission it is my clinical judgment that the patient will require inpatient hospital care spanning beyond 2 midnights from the point of admission due to high intensity of service, high risk for further deterioration and high frequency of surveillance required.Barbette Merino MD [facility] Pager [phone]  If 7PM-7AM, please contact night-coverage [email or web address] Password [facility]  06/29/2018, 12:27 AM

## 2018-06-29 NOTE — Progress Notes (Signed)
Pharmacy Antibiotic Note  Lawrence Liu is a 62 y.o. male admitted on 06/28/2018 with intra-abdominal infection.  Pharmacy has been consulted for meropenem dosing.  Plan: Meropenem 1 Gm IV q8h F/u scr/cultures/levels     Temp (24hrs), Avg:97.5 F (36.4 C), Min:97.5 F (36.4 C), Max:97.5 F (36.4 C)  Recent Labs  Lab 06/28/18 2113  WBC 13.3*  CREATININE 1.06    CrCl cannot be calculated (Unknown ideal weight.).    Allergies  Allergen Reactions  . Latex Anaphylaxis  . Penicillins Hives    Whencombined with sulfa, CAN take penicillin by itself Has patient had a PCN reaction causing immediate rash, facial/tongue/throat swelling, SOB or lightheadedness with hypotension: Yes Has patient had a PCN reaction causing severe rash involving mucus membranes or skin necrosis: No Has patient had a PCN reaction that required hospitalization: No Has patient had a PCN reaction occurring within the last 10 years: No If all of the above answers are "NO", then may proceed with Cephalosporin use.  . Sulfa Antibiotics Hives    Only when mixed sulfa and pencillin together    Antimicrobials this admission: 11/28 meropenem >>    >>   Dose adjustments this admission:   Microbiology results:  BCx:   UCx:    Sputum:    MRSA PCR:   Thank you for allowing pharmacy to be a part of this patient's care.  Dorrene German 06/29/2018 12:32 AM

## 2018-06-29 NOTE — Progress Notes (Signed)
Patient seen and examined at bedside, patient admitted after midnight, please see earlier detailed admission note by Elwyn Reach, MD. Briefly, patient presented abdominal pain with imaging concerning for acalculous cholecystitis. General surgery consulted. Patient's pain resolved currently. Plan for HIDA which will not be performed until tomorrow. Discussed with general surgery, [facility], who recommends HIDA prior to discharge consideration.   Cordelia Poche, MD [facility] 06/29/2018, 1:07 PM

## 2018-06-29 NOTE — ED Notes (Signed)
ED TO INPATIENT HANDOFF REPORT  Name/Age/Gender Lawrence Liu 62 y.o. male  Code Status Code Status History    Date Active Date Inactive Code Status Order ID Comments User Context   08/15/2017 1319 08/21/2017 1604 Full Code [id number]  Marcene Corning Inpatient   10/03/2013 1105 10/04/2013 2122 Full Code [id number]  Jolaine Artist, MD Inpatient   10/02/2013 1923 10/03/2013 1105 Full Code [id number]  Shanda Howells, MD ED      Home/SNF/Other Home  Chief Complaint abdominal pain   Level of Care/Admitting Diagnosis ED Disposition    ED Disposition Condition [place] [facility] Area: [facility] [[id number]]  Level of Care: Med-Surg [16]  Diagnosis: Acute acalculous cholecystitis [852778]  Admitting Physician: Elwyn Reach [[id number]]  Attending Physician: Elwyn Reach [[id number]]  Estimated length of stay: past midnight tomorrow  Certification:: I certify this patient will need inpatient services for at least 2 midnights  PT Class (Do Not Modify): Inpatient [101]  PT Acc Code (Do Not Modify): Private [1]       Medical History Past Medical History:  Diagnosis Date  . Alpha galactosidase deficiency    multiple tick bites  . Deep vein thrombosis ([place]) 2004   Left deep vein thrombosis of left calf  . GERD (gastroesophageal reflux disease)   . History of blood transfusion    as a baby for jaundice  . Hypertension   . 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 No lesions greater than 30% noted. - confirmed by Cor CTA  . Pneumonia 1980's   walking pneumonia  . Status post ascending aortic aneurysm repair 08/2016   Dr. Cyndia Bent  . Thoracic ascending aortic aneurysm ([place]) 11/06/2013   CTA of chest November 2017 showed notable increase in size --> s/p Open (Valve Sparing) Repair Jan 2019)    Allergies Allergies  Allergen Reactions  . Latex Anaphylaxis  . Penicillins Hives    Whencombined with sulfa, CAN take  penicillin by itself Has patient had a PCN reaction causing immediate rash, facial/tongue/throat swelling, SOB or lightheadedness with hypotension: Yes Has patient had a PCN reaction causing severe rash involving mucus membranes or skin necrosis: No Has patient had a PCN reaction that required hospitalization: No Has patient had a PCN reaction occurring within the last 10 years: No If all of the above answers are "NO", then may proceed with Cephalosporin use.  . Sulfa Antibiotics Hives    Only when mixed sulfa and pencillin together    IV Location/Drains/Wounds Patient Lines/Drains/Airways Status   Active Line/Drains/Airways    Name:   Placement date:   Placement time:   Site:   Days:   Peripheral IV 06/28/18 Left Antecubital   06/28/18    2112    Antecubital   1   Incision (Closed) 08/15/17 Chest Other (Comment)   08/15/17    0842     318          Labs/Imaging Results for orders placed or performed during the hospital encounter of 06/28/18 (from the past 48 hour(s))  Lipase, blood     Status: None   Collection Time: 06/28/18  9:13 PM  Result Value Ref Range   Lipase 31 11 - 51 U/L    Comment: Performed at [facility], [place] [address], [place], [place] [address]  Comprehensive metabolic panel     Status: Abnormal   Collection Time: 06/28/18  9:13 PM  Result Value Ref  Range   Sodium 139 135 - 145 mmol/L   Potassium 3.6 3.5 - 5.1 mmol/L   Chloride 107 98 - 111 mmol/L   CO2 23 22 - 32 mmol/L   Glucose, Bld 127 (H) 70 - 99 mg/dL   BUN 19 6 - 20 mg/dL   Creatinine, Ser 1.06 0.61 - 1.24 mg/dL   Calcium 8.9 8.9 - 10.3 mg/dL   Total Protein 7.5 6.5 - 8.1 g/dL   Albumin 4.3 3.5 - 5.0 g/dL   AST 62 (H) 15 - 41 U/L   ALT 43 0 - 44 U/L   Alkaline Phosphatase 83 38 - 126 U/L   Total Bilirubin 0.9 0.3 - 1.2 mg/dL   GFR calc non Af Amer >60 >60 mL/min   GFR calc Af Amer >60 >60 mL/min   Anion gap 9 5 - 15    Comment: Performed at [facility],  [place] [address], [place], [place] [address]  CBC     Status: Abnormal   Collection Time: 06/28/18  9:13 PM  Result Value Ref Range   WBC 13.3 (H) 4.0 - 10.5 K/uL   RBC 5.09 4.22 - 5.81 MIL/uL   Hemoglobin 13.6 13.0 - 17.0 g/dL   HCT 42.3 39.0 - 52.0 %   MCV 83.1 80.0 - 100.0 fL   MCH 26.7 26.0 - 34.0 pg   MCHC 32.2 30.0 - 36.0 g/dL   RDW 13.5 11.5 - 15.5 %   Platelets 225 150 - 400 K/uL   nRBC 0.0 0.0 - 0.2 %    Comment: Performed at [facility], [place] [address] [place], [place] [address]  I-Stat Troponin, ED (not at [facility])     Status: None   Collection Time: 06/28/18  9:19 PM  Result Value Ref Range   Troponin i, poc 0.00 0.00 - 0.08 ng/mL   Comment 3            Comment: Due to the release kinetics of cTnI, a negative result within the first hours of the onset of symptoms does not rule out myocardial infarction with certainty. If myocardial infarction is still suspected, repeat the test at appropriate intervals.   Urinalysis, Routine w reflex microscopic     Status: None   Collection Time: 06/28/18 10:52 PM  Result Value Ref Range   Color, Urine YELLOW YELLOW   APPearance CLEAR CLEAR   Specific Gravity, Urine 1.020 1.005 - 1.030   pH 5.0 5.0 - 8.0   Glucose, UA NEGATIVE NEGATIVE mg/dL   Hgb urine dipstick NEGATIVE NEGATIVE   Bilirubin Urine NEGATIVE NEGATIVE   Ketones, ur NEGATIVE NEGATIVE mg/dL   Protein, ur NEGATIVE NEGATIVE mg/dL   Nitrite NEGATIVE NEGATIVE   Leukocytes, UA NEGATIVE NEGATIVE   RBC / HPF 0-5 0 - 5 RBC/hpf   WBC, UA 0-5 0 - 5 WBC/hpf   Bacteria, UA NONE SEEN NONE SEEN   Mucus PRESENT     Comment: Performed at [facility], [place] [address], [place], [place] [address]   Dg Chest 2 View  Result Date: 06/28/2018 CLINICAL DATA:  62 y/o  M; abdominal pain after eating. EXAM: CHEST - 2 VIEW COMPARISON:  03/21/2018 chest radiograph. FINDINGS: Stable cardiac silhouette within normal limits given projection and technique.  Post median sternotomy with wires in alignment. Clear lungs. No pleural effusion or pneumothorax. No acute osseous abnormality is evident. IMPRESSION: No acute pulmonary process identified. Electronically Signed   By: Edgardo Roys.D.  On: 06/28/2018 22:00   US Abdomen Limited Ruq  Result Date: 06/28/2018 CLINICAL DATA:  Right upper quadrant abdominal pain EXAM: ULTRASOUND ABDOMEN LIMITED RIGHT UPPER QUADRANT COMPARISON:  03/22/2018 right upper quadrant abdominal sonogram FINDINGS: Gallbladder: No gallstones. Mild layering sludge. Mild diffuse gallbladder wall thickening and mild gallbladder distention with sonographic Murphy sign reported. No pericholecystic fluid. Common bile duct: Diameter: 4 mm Liver: Liver parenchyma is diffusely echogenic with mild posterior acoustic attenuation. No definite liver surface irregularity. No liver mass, noting decreased sensitivity in the setting of an echogenic liver. Portal vein is patent on color Doppler imaging with normal direction of blood flow towards the liver. IMPRESSION: 1. No cholelithiasis. Mild gallbladder sludge. Mild diffuse gallbladder wall thickening. Mild gallbladder distention. Sonographic Murphy sign reported. No pericholecystic fluid. These sonographic findings are nonspecific. Acute acalculous cholecystitis is unlikely in the emergency department setting. If there is clinical concern for acute cholecystitis, hepatobiliary scintigraphy study could be obtained. 2. No biliary ductal dilatation. 3. Diffuse hepatic steatosis. Electronically Signed   By: Ilona Sorrel M.D.   On: 06/28/2018 21:43    Pending Labs [employer] (From admission, onward)    Start     Ordered   Signed and Held  HIV antibody (Routine Testing)  Once,   R     Signed and Held   Signed and Held  CBC  (heparin)  Once,   R    Comments:  Baseline for heparin therapy IF NOT ALREADY DRAWN.  Notify MD if PLT < 100 K.    Signed and Held   Signed and Held  Creatinine,  serum  (heparin)  Once,   R    Comments:  Baseline for heparin therapy IF NOT ALREADY DRAWN.    Signed and Held   Signed and Held  Comprehensive metabolic panel  Tomorrow morning,   R     Signed and Held   Signed and Held  CBC  Tomorrow morning,   R     Signed and Held          Vitals/Pain Today's Vitals   06/28/18 2300 06/28/18 2330 06/29/18 0000 06/29/18 0030  BP: 118/77 130/84 123/82 118/76  Pulse: 76 74 73 72  Resp: 18 20 18 17   Temp:      TempSrc:      SpO2: 95% 96% 96% 93%  PainSc:        Isolation Precautions No active isolations  Medications Medications  meropenem (MERREM) 1 g in sodium chloride 0.9 % 100 mL IVPB (has no administration in time range)  promethazine (PHENERGAN) injection 25 mg (25 mg Intravenous Given 06/28/18 2058)  morphine 4 MG/ML injection 4 mg (4 mg Intravenous Given 06/28/18 2112)    Mobility walks

## 2018-06-30 ENCOUNTER — Inpatient Hospital Stay (HOSPITAL_COMMUNITY): Payer: 59

## 2018-06-30 LAB — COMPREHENSIVE METABOLIC PANEL
ALT: 80 U/L — ABNORMAL HIGH (ref 0–44)
AST: 46 U/L — ABNORMAL HIGH (ref 15–41)
Albumin: 4 g/dL (ref 3.5–5.0)
Alkaline Phosphatase: 83 U/L (ref 38–126)
Anion gap: 6 (ref 5–15)
BUN: 10 mg/dL (ref 6–20)
CO2: 26 mmol/L (ref 22–32)
Calcium: 8.8 mg/dL — ABNORMAL LOW (ref 8.9–10.3)
Chloride: 107 mmol/L (ref 98–111)
Creatinine, Ser: 0.96 mg/dL (ref 0.61–1.24)
GFR calc Af Amer: >60 mL/min (ref >60)
GFR calc non Af Amer: >60 mL/min (ref >60)
Glucose, Bld: 118 mg/dL — ABNORMAL HIGH (ref 70–99)
Potassium: 3.8 mmol/L (ref 3.5–5.1)
Sodium: 139 mmol/L (ref 135–145)
Total Bilirubin: 1.2 mg/dL (ref 0.3–1.2)
Total Protein: 6.9 g/dL (ref 6.5–8.1)

## 2018-06-30 LAB — HIV ANTIBODY (ROUTINE TESTING W REFLEX): HIV Screen 4th Generation wRfx: NONREACTIVE

## 2018-06-30 MED ORDER — METOPROLOL SUCCINATE ER 50 MG PO TB24
100.0000 mg | ORAL_TABLET | Freq: Every day | ORAL | Status: DC
  Administered 2018-06-30 – 2018-07-01 (×2): 100 mg via ORAL
  Filled 2018-06-30 (×2): qty 2

## 2018-06-30 MED ORDER — MORPHINE SULFATE (PF) 4 MG/ML IV SOLN
4.0000 mg | INTRAVENOUS | Status: DC | PRN
  Administered 2018-06-30: 4 mg via INTRAVENOUS
  Filled 2018-06-30: qty 1

## 2018-06-30 MED ORDER — HYDROCODONE-ACETAMINOPHEN 5-325 MG PO TABS: 1.0000 | ORAL_TABLET | Freq: Four times a day (QID) | ORAL | Status: DC | PRN

## 2018-06-30 MED ORDER — AMLODIPINE BESYLATE 5 MG PO TABS
5.0000 mg | ORAL_TABLET | Freq: Every day | ORAL | Status: DC
  Administered 2018-06-30 – 2018-07-01 (×2): 5 mg via ORAL
  Filled 2018-06-30 (×2): qty 1

## 2018-06-30 MED ORDER — AMLODIPINE BESY-BENAZEPRIL HCL 5-20 MG PO CAPS: 1.0000 | ORAL_CAPSULE | Freq: Every day | ORAL | Status: DC

## 2018-06-30 MED ORDER — TECHNETIUM TC 99M MEBROFENIN IV KIT
5.5000 | PACK | Freq: Once | INTRAVENOUS | Status: AC | PRN
  Administered 2018-06-30: 5.5 via INTRAVENOUS

## 2018-06-30 MED ORDER — BENAZEPRIL HCL 10 MG PO TABS
20.0000 mg | ORAL_TABLET | Freq: Every day | ORAL | Status: DC
  Administered 2018-06-30 – 2018-07-01 (×2): 20 mg via ORAL
  Filled 2018-06-30 (×2): qty 2

## 2018-06-30 NOTE — Progress Notes (Signed)
Pt with no signs of acute cholecystitis on initial CT scan. HIDA with no signs of acute cholecystitis, with signs of biliary dyskensia. This can be followed up as outpt.   No emergent need for surgery at this time Would rec low fat diet.

## 2018-06-30 NOTE — Progress Notes (Addendum)
PROGRESS NOTE    Lawrence Liu  ZOX:[id number] DOB: 10/21/1955 DOA: 06/28/2018 PCP: [name], MD   Brief Narrative: Lawrence Liu is a 62 y.o. male with medical history significant of hypertension, DVT, GERD, thoracic ascending aortic aneurysm status post sternotomy and valve sparing aortic surgery, nonobstructive coronary artery disease with previous A. fib postoperatively and aortic root dilatation. He presented with abdominal pain concerning for acute cholecystitis. He was started on empiric antibiotics. HIDA significant for biliary dyskinesia.   Assessment & Plan:   Principal Problem:   Acute acalculous cholecystitis Active Problems:   GERD (gastroesophageal reflux disease)   History of DVT (deep vein thrombosis)   Essential hypertension   RUQ pain Initial thought secondary to cholecystitis, but ultrasound and HIDA do not suggest this. HIDA suggest biliary dyskinesia. AST/ALT trended down, however pain worsened this afternoon. -General surgery recommendations -Discontinue meropenem  GERD -Continue Protonix  Essential hypertension -Continue Labetalol  History of DVT Not on anticoagulation  Leukocytosis Resolved.    DVT prophylaxis: Heparin subq Code Status:   Code Status: Full Code Family Communication: None at bedside Disposition Plan: Discharge pending surgery recommendations   Consultants:   General surgery  Procedures:   None  Antimicrobials:  Meropenem (11/28>>11/29)    Subjective: RUQ pain returned this afternoon after HIDA scan. Pain is sharp and non-radiating.  Objective: Vitals:   06/29/18 1320 06/29/18 2150 06/30/18 0551 06/30/18 1340  BP: 118/85 135/83 127/90 (!) 135/93  Pulse: 74 66 83 81  Resp: 14 18 18 16   Temp: 98 F (36.7 C) 98.2 F (36.8 C) 97.9 F (36.6 C) 97.7 F (36.5 C)  TempSrc: Oral Oral Oral Oral  SpO2: 97% 99% 96% 100%  Weight:      Height:        Intake/Output Summary (Last 24 hours) at  06/30/2018 1356 Last data filed at 06/30/2018 1000 Gross per 24 hour  Intake 4399.59 ml  Output 500 ml  Net 3899.59 ml   Filed Weights   06/29/18 0113  Weight: 95.1 kg    Examination:  General exam: Appears calm and comfortable Respiratory system: Respiratory effort normal. Gastrointestinal system: Abdomen is nondistended Central nervous system: Alert and oriented. No focal neurological deficits. Extremities: No swelling Skin: No cyanosis. No rashes Psychiatry: Judgement and insight appear normal. Mood & affect appropriate.     Data Reviewed: I have personally reviewed following labs and imaging studies  CBC: Recent Labs  Lab 06/28/18 2113 06/29/18 0449  WBC 13.3* 6.2  HGB 13.6 12.8*  HCT 42.3 41.0  MCV 83.1 83.0  PLT 225 811   Basic Metabolic Panel: Recent Labs  Lab 06/28/18 2113 06/29/18 0449 06/30/18 0747  NA 139 141 139  K 3.6 3.7 3.8  CL 107 112* 107  CO2 23 23 26   GLUCOSE 127* 116* 118*  BUN 19 18 10   CREATININE 1.06 1.00 0.96  CALCIUM 8.9 8.5* 8.8*   GFR: Estimated Creatinine Clearance: 92.5 mL/min (by C-G formula based on SCr of 0.96 mg/dL). Liver Function Tests: Recent Labs  Lab 06/28/18 2113 06/29/18 0449 06/30/18 0747  AST 62* 111* 46*  ALT 43 98* 80*  ALKPHOS 83 84 83  BILITOT 0.9 1.1 1.2  PROT 7.5 6.3* 6.9  ALBUMIN 4.3 3.7 4.0   Recent Labs  Lab 06/28/18 2113  LIPASE 31   No results for input(s): AMMONIA in the last 168 hours. Coagulation Profile: No results for input(s): INR, PROTIME in the last 168 hours. Cardiac Enzymes: No results  for input(s): CKTOTAL, CKMB, CKMBINDEX, TROPONINI in the last 168 hours. BNP (last 3 results) No results for input(s): PROBNP in the last 8760 hours. HbA1C: No results for input(s): HGBA1C in the last 72 hours. CBG: No results for input(s): GLUCAP in the last 168 hours. Lipid Profile: No results for input(s): CHOL, HDL, LDLCALC, TRIG, CHOLHDL, LDLDIRECT in the last 72 hours. Thyroid  Function Tests: No results for input(s): TSH, T4TOTAL, FREET4, T3FREE, THYROIDAB in the last 72 hours. Anemia Panel: No results for input(s): VITAMINB12, FOLATE, FERRITIN, TIBC, IRON, RETICCTPCT in the last 72 hours. Sepsis Labs: No results for input(s): PROCALCITON, LATICACIDVEN in the last 168 hours.  No results found for this or any previous visit (from the past 240 hour(s)).       Radiology Studies: Dg Chest 2 View  Result Date: 06/28/2018 CLINICAL DATA:  62 y/o  M; abdominal pain after eating. EXAM: CHEST - 2 VIEW COMPARISON:  03/21/2018 chest radiograph. FINDINGS: Stable cardiac silhouette within normal limits given projection and technique. Post median sternotomy with wires in alignment. Clear lungs. No pleural effusion or pneumothorax. No acute osseous abnormality is evident. IMPRESSION: No acute pulmonary process identified. Electronically Signed   By: Kristine Garbe M.D.   On: 06/28/2018 22:00   Nm Hepato W/eject Fract  Result Date: 06/30/2018 CLINICAL DATA:  Right upper quadrant abdominal pain with associated nausea. Mild gallbladder wall thickening and biliary distension by ultrasound. EXAM: NUCLEAR MEDICINE HEPATOBILIARY IMAGING WITH GALLBLADDER EF TECHNIQUE: Sequential images of the abdomen were obtained out to 60 minutes following intravenous administration of radiopharmaceutical. After oral ingestion of ensure, gallbladder ejection fraction was determined. RADIOPHARMACEUTICALS:  5.5 mCi Tc-39m Choletec IV COMPARISON:  Right upper quadrant abdominal ultrasound on 06/28/2018 FINDINGS: Prompt uptake and biliary excretion of activity by the liver is seen. Gallbladder activity is normally visualized, consistent with patency of cystic duct. Biliary activity passes into small bowel, consistent with patent common bile duct. Calculated gallbladder ejection fraction is 13%. (Normal gallbladder ejection fraction with half-and-half is greater than 33%.) IMPRESSION: 1. No evidence  of acute cholecystitis with normal visualization of the gallbladder by nuclear medicine scintigraphy. No evidence of biliary obstruction. 2. Abnormal low gallbladder ejection fraction of 13%. Findings are suggestive of underlying biliary dyskinesia. Electronically Signed   By: Aletta Edouard M.D.   On: 06/30/2018 13:29   US Abdomen Limited Ruq  Result Date: 06/28/2018 CLINICAL DATA:  Right upper quadrant abdominal pain EXAM: ULTRASOUND ABDOMEN LIMITED RIGHT UPPER QUADRANT COMPARISON:  03/22/2018 right upper quadrant abdominal sonogram FINDINGS: Gallbladder: No gallstones. Mild layering sludge. Mild diffuse gallbladder wall thickening and mild gallbladder distention with sonographic Murphy sign reported. No pericholecystic fluid. Common bile duct: Diameter: 4 mm Liver: Liver parenchyma is diffusely echogenic with mild posterior acoustic attenuation. No definite liver surface irregularity. No liver mass, noting decreased sensitivity in the setting of an echogenic liver. Portal vein is patent on color Doppler imaging with normal direction of blood flow towards the liver. IMPRESSION: 1. No cholelithiasis. Mild gallbladder sludge. Mild diffuse gallbladder wall thickening. Mild gallbladder distention. Sonographic Murphy sign reported. No pericholecystic fluid. These sonographic findings are nonspecific. Acute acalculous cholecystitis is unlikely in the emergency department setting. If there is clinical concern for acute cholecystitis, hepatobiliary scintigraphy study could be obtained. 2. No biliary ductal dilatation. 3. Diffuse hepatic steatosis. Electronically Signed   By: Ilona Sorrel M.D.   On: 06/28/2018 21:43        Scheduled Meds: . heparin  5,000 Units Subcutaneous Q8H  .  pantoprazole (PROTONIX) IV  40 mg Intravenous Q24H   Continuous Infusions: . dextrose 5 % and 0.45% NaCl 125 mL/hr at 06/30/18 1140  . meropenem (MERREM) IV 1 g (06/30/18 1145)     LOS: 1 day     Cordelia Poche, MD [facility] 06/30/2018, 1:56 PM  If 7PM-7AM, please contact night-coverage [email or web address]

## 2018-07-01 DIAGNOSIS — K828 Other specified diseases of gallbladder: Principal | ICD-10-CM

## 2018-07-01 DIAGNOSIS — Z86718 Personal history of other venous thrombosis and embolism: Secondary | ICD-10-CM

## 2018-07-01 DIAGNOSIS — K219 Gastro-esophageal reflux disease without esophagitis: Secondary | ICD-10-CM

## 2018-07-01 DIAGNOSIS — I10 Essential (primary) hypertension: Secondary | ICD-10-CM

## 2018-07-01 MED ORDER — HYDROCODONE-ACETAMINOPHEN 5-325 MG PO TABS: 1.0000 | ORAL_TABLET | Freq: Four times a day (QID) | ORAL | 0 refills | Status: DC | PRN

## 2018-07-01 MED ORDER — PANTOPRAZOLE SODIUM 40 MG PO TBEC: 40.0000 mg | DELAYED_RELEASE_TABLET | Freq: Every day | ORAL | Status: DC

## 2018-07-01 NOTE — Discharge Summary (Signed)
Physician Discharge Summary  Lawrence Liu IRS:[id number] DOB: [birth year] DOA: 06/28/2018  PCP: [name], MD  Admit date: 06/28/2018 Discharge date: 07/01/2018  Admitted From: Home Disposition: Home  Recommendations for Outpatient Follow-up:  1. Follow up with PCP in 1 week 2. Follow up with Dr. Lucia Gaskins in 1-2 weeks 3. Please obtain BMP/CBC in one week 4. Please follow up on the following pending results: None  Home Health: None Equipment/Devices: None  Discharge Condition: Stable CODE STATUS: Full code Diet recommendation: Low fat diet   Brief/Interim Summary:  Admission HPI written by Elwyn Reach, MD   Chief Complaint: Abdominal pain  HPI: Lawrence Liu is a 62 y.o. male with medical history significant of hypertension, DVT, GERD, thoracic ascending aortic aneurysm status post sternotomy and valve sparing aortic surgery, nonobstructive coronary artery disease with previous A. fib postoperatively and aortic root dilatation who presents to the ER with abdominal pain that happens suddenly starting today.  Pain was rated as 6-7 out of 10.  Mainly in the epigastric to right upper quadrant.  Associated with some nausea but no vomiting no diarrhea.  Patient has not been able to eat or drink all day.  He came to the ER where he was evaluated.  He has some right upper quadrant abdominal tenderness.  Evaluation so far suggested a calculus cholecystitis.  Surgery involved and patient is to get nuclear medicine study to determine Of action.  He is being admitted to the hospital for treatment..  ED Course: Temperature is 97.5 with blood pressure 149/90 pulse 80 respirate of 20 oxygen sat 98% on room air.  White count is 13.3 hemoglobin 13.6 platelet 225.  Chemistry appeared to be all within normal except for glucose of 127.  LFTs showed nonobstructive pattern.  Chest x-ray showed no active disease.  Abdominal ultrasound limited showed No cholelithiasis. Mild  gallbladder sludge. Mild diffuse gallbladder wall thickening. Mild gallbladder distention. Sonographic Murphy sign reported. No pericholecystic fluid. These sonographic findings are nonspecific. Acute acalculous cholecystitis is unlikely in the emergency department setting. If there is clinical concern for acute cholecystitis, hepatobiliary scintigraphy tudy could be obtained   Hospital course:  Biliary dyskinesia Initial thought secondary to cholecystitis, but ultrasound and HIDA do not suggest this. HIDA suggest biliary dyskinesia. AST/ALT trended down, however pain worsened prior to consideration for discharge. Pain later controlled with oral analgesics and patient discharged with recommendations for outpatient general surgery follow-up.  GERD Protonix  Essential hypertension Labetalol  History of DVT Not on anticoagulation currently.  Leukocytosis Resolved.  Discharge Diagnoses:  Principal Problem:   Biliary dyskinesia Active Problems:   GERD (gastroesophageal reflux disease)   History of DVT (deep vein thrombosis)   Essential hypertension    Discharge Instructions  Discharge Instructions    Call MD for:  severe uncontrolled pain   Complete by:  As directed    Call MD for:  temperature >100.4   Complete by:  As directed    Discharge instructions   Complete by:  As directed    Please eat a low fat diet. Please follow up with Dr. Lucia Gaskins for discussion of removal of your gallbladder. Take the pain medication as directed; do not mix with alcohol or other medications that can make you drowsy. Please return if having worsening, uncontrolled symptoms.   Increase activity slowly   Complete by:  As directed      Allergies as of 07/01/2018      Reactions   Latex Anaphylaxis  Penicillins Hives   Whencombined with sulfa, CAN take penicillin by itself Has patient had a PCN reaction causing immediate rash, facial/tongue/throat swelling, SOB or lightheadedness with  hypotension: Yes Has patient had a PCN reaction causing severe rash involving mucus membranes or skin necrosis: No Has patient had a PCN reaction that required hospitalization: No Has patient had a PCN reaction occurring within the last 10 years: No If all of the above answers are "NO", then may proceed with Cephalosporin use.   Sulfa Antibiotics Hives   Only when mixed sulfa and pencillin together      Medication List    STOP taking these medications   acetaminophen 500 MG tablet Commonly known as:  TYLENOL   azithromycin 250 MG tablet Commonly known as:  ZITHROMAX     TAKE these medications   amLODipine-benazepril 5-20 MG capsule Commonly known as:  LOTREL Take 1 capsule by mouth daily.   aspirin 325 MG EC tablet Take 1 tablet (325 mg total) by mouth daily.   atorvastatin 20 MG tablet Commonly known as:  LIPITOR TAKE 1 TABLET (20 MG TOTAL) BY MOUTH DAILY.   Co Q-10 300 MG Caps Take 300 mg by mouth daily.   famotidine 20 MG tablet Commonly known as:  PEPCID Take 20 mg by mouth daily.   fexofenadine 180 MG tablet Commonly known as:  ALLEGRA Take 180 mg by mouth daily.   HYDROcodone-acetaminophen 5-325 MG tablet Commonly known as:  NORCO/VICODIN Take 1-2 tablets by mouth every 6 (six) hours as needed for moderate pain.   metoprolol succinate 100 MG 24 hr tablet Commonly known as:  TOPROL-XL Take 100 mg by mouth daily. What changed:  Another medication with the same name was removed. Continue taking this medication, and follow the directions you see here.   PROBIOTIC PO Take 1 tablet by mouth daily. PROBIOTICS 10   Vitamin B-12 5000 MCG Tbdp Take 5,000 mcg by mouth daily.   vitamin C 1000 MG tablet Take 1,000 mg by mouth daily.   Vitamin D3 125 MCG (5000 UT) Caps Take 5,000 Units by mouth daily.   vitamin E 400 UNIT capsule Take 800 Units by mouth daily.      Follow-up Information    [name], MD. Schedule an appointment as soon as possible  for a visit in 1 week(s).   Specialty:  Family Medicine Contact information: [address] [place] [address] [phone]        Alphonsa Overall, MD. Schedule an appointment as soon as possible for a visit in 1 week(s).   Specialty:  General Surgery Why:  Biliary dyskinesia Contact information: [address] [place] [address] [phone]          Allergies  Allergen Reactions  . Latex Anaphylaxis  . Penicillins Hives    Whencombined with sulfa, CAN take penicillin by itself Has patient had a PCN reaction causing immediate rash, facial/tongue/throat swelling, SOB or lightheadedness with hypotension: Yes Has patient had a PCN reaction causing severe rash involving mucus membranes or skin necrosis: No Has patient had a PCN reaction that required hospitalization: No Has patient had a PCN reaction occurring within the last 10 years: No If all of the above answers are "NO", then may proceed with Cephalosporin use.  . Sulfa Antibiotics Hives    Only when mixed sulfa and pencillin together    Consultations:  General surgery   Procedures/Studies: Dg Chest 2 View  Result Date: 06/28/2018 CLINICAL  DATA:  62 y/o  M; abdominal pain after eating. EXAM: CHEST - 2 VIEW COMPARISON:  03/21/2018 chest radiograph. FINDINGS: Stable cardiac silhouette within normal limits given projection and technique. Post median sternotomy with wires in alignment. Clear lungs. No pleural effusion or pneumothorax. No acute osseous abnormality is evident. IMPRESSION: No acute pulmonary process identified. Electronically Signed   By: Kristine Garbe M.D.   On: 06/28/2018 22:00   Nm Hepato W/eject Fract  Result Date: 06/30/2018 CLINICAL DATA:  Right upper quadrant abdominal pain with associated nausea. Mild gallbladder wall thickening and biliary distension by ultrasound. EXAM: NUCLEAR MEDICINE HEPATOBILIARY IMAGING WITH GALLBLADDER EF TECHNIQUE: Sequential images of the  abdomen were obtained out to 60 minutes following intravenous administration of radiopharmaceutical. After oral ingestion of ensure, gallbladder ejection fraction was determined. RADIOPHARMACEUTICALS:  5.5 mCi Tc-24m Choletec IV COMPARISON:  Right upper quadrant abdominal ultrasound on 06/28/2018 FINDINGS: Prompt uptake and biliary excretion of activity by the liver is seen. Gallbladder activity is normally visualized, consistent with patency of cystic duct. Biliary activity passes into small bowel, consistent with patent common bile duct. Calculated gallbladder ejection fraction is 13%. (Normal gallbladder ejection fraction with half-and-half is greater than 33%.) IMPRESSION: 1. No evidence of acute cholecystitis with normal visualization of the gallbladder by nuclear medicine scintigraphy. No evidence of biliary obstruction. 2. Abnormal low gallbladder ejection fraction of 13%. Findings are suggestive of underlying biliary dyskinesia. Electronically Signed   By: Aletta Edouard M.D.   On: 06/30/2018 13:29   US Abdomen Limited Ruq  Result Date: 06/28/2018 CLINICAL DATA:  Right upper quadrant abdominal pain EXAM: ULTRASOUND ABDOMEN LIMITED RIGHT UPPER QUADRANT COMPARISON:  03/22/2018 right upper quadrant abdominal sonogram FINDINGS: Gallbladder: No gallstones. Mild layering sludge. Mild diffuse gallbladder wall thickening and mild gallbladder distention with sonographic Murphy sign reported. No pericholecystic fluid. Common bile duct: Diameter: 4 mm Liver: Liver parenchyma is diffusely echogenic with mild posterior acoustic attenuation. No definite liver surface irregularity. No liver mass, noting decreased sensitivity in the setting of an echogenic liver. Portal vein is patent on color Doppler imaging with normal direction of blood flow towards the liver. IMPRESSION: 1. No cholelithiasis. Mild gallbladder sludge. Mild diffuse gallbladder wall thickening. Mild gallbladder distention. Sonographic Murphy sign  reported. No pericholecystic fluid. These sonographic findings are nonspecific. Acute acalculous cholecystitis is unlikely in the emergency department setting. If there is clinical concern for acute cholecystitis, hepatobiliary scintigraphy study could be obtained. 2. No biliary ductal dilatation. 3. Diffuse hepatic steatosis. Electronically Signed   By: Ilona Sorrel M.D.   On: 06/28/2018 21:43      Subjective: Abdominal pain resolved.  Discharge Exam: Vitals:   06/30/18 2339 07/01/18 0636  BP: 110/77 123/82  Pulse: (!) 58 64  Resp: 18 18  Temp: 98.2 F (36.8 C) 98.3 F (36.8 C)  SpO2: 99% 98%   Vitals:   06/30/18 0551 06/30/18 1340 06/30/18 2339 07/01/18 0636  BP: 127/90 (!) 135/93 110/77 123/82  Pulse: 83 81 (!) 58 64  Resp: 18 16 18 18   Temp: 97.9 F (36.6 C) 97.7 F (36.5 C) 98.2 F (36.8 C) 98.3 F (36.8 C)  TempSrc: Oral Oral Oral Oral  SpO2: 96% 100% 99% 98%  Weight:      Height:        General: Pt is alert, awake, not in acute distress Cardiovascular: RRR, S1/S2 +, no rubs, no gallops Respiratory: CTA bilaterally, no wheezing, no rhonchi Abdominal: Soft, NT, ND, bowel sounds + Extremities: no edema, no cyanosis  The results of significant diagnostics from this hospitalization (including imaging, microbiology, ancillary and laboratory) are listed below for reference.     Microbiology: No results found for this or any previous visit (from the past 240 hour(s)).   Labs: BNP (last 3 results) No results for input(s): BNP in the last 8760 hours. Basic Metabolic Panel: Recent Labs  Lab 06/28/18 2113 06/29/18 0449 06/30/18 0747  NA 139 141 139  K 3.6 3.7 3.8  CL 107 112* 107  CO2 23 23 26   GLUCOSE 127* 116* 118*  BUN 19 18 10   CREATININE 1.06 1.00 0.96  CALCIUM 8.9 8.5* 8.8*   Liver Function Tests: Recent Labs  Lab 06/28/18 2113 06/29/18 0449 06/30/18 0747  AST 62* 111* 46*  ALT 43 98* 80*  ALKPHOS 83 84 83  BILITOT 0.9 1.1 1.2  PROT 7.5  6.3* 6.9  ALBUMIN 4.3 3.7 4.0   Recent Labs  Lab 06/28/18 2113  LIPASE 31   No results for input(s): AMMONIA in the last 168 hours. CBC: Recent Labs  Lab 06/28/18 2113 06/29/18 0449  WBC 13.3* 6.2  HGB 13.6 12.8*  HCT 42.3 41.0  MCV 83.1 83.0  PLT 225 191   Cardiac Enzymes: No results for input(s): CKTOTAL, CKMB, CKMBINDEX, TROPONINI in the last 168 hours. BNP: Invalid input(s): POCBNP CBG: No results for input(s): GLUCAP in the last 168 hours. D-Dimer No results for input(s): DDIMER in the last 72 hours. Hgb A1c No results for input(s): HGBA1C in the last 72 hours. Lipid Profile No results for input(s): CHOL, HDL, LDLCALC, TRIG, CHOLHDL, LDLDIRECT in the last 72 hours. Thyroid function studies No results for input(s): TSH, T4TOTAL, T3FREE, THYROIDAB in the last 72 hours.  Invalid input(s): FREET3 Anemia work up No results for input(s): VITAMINB12, FOLATE, FERRITIN, TIBC, IRON, RETICCTPCT in the last 72 hours. Urinalysis    Component Value Date/Time   COLORURINE YELLOW 06/28/2018 2252   APPEARANCEUR CLEAR 06/28/2018 2252   LABSPEC 1.020 06/28/2018 2252   PHURINE 5.0 06/28/2018 2252   GLUCOSEU NEGATIVE 06/28/2018 2252   HGBUR NEGATIVE 06/28/2018 2252   BILIRUBINUR NEGATIVE 06/28/2018 2252   KETONESUR NEGATIVE 06/28/2018 2252   PROTEINUR NEGATIVE 06/28/2018 2252   UROBILINOGEN 0.2 08/18/2010 2332   NITRITE NEGATIVE 06/28/2018 2252   LEUKOCYTESUR NEGATIVE 06/28/2018 2252    SIGNED:   Cordelia Poche, MD [facility] 07/01/2018, 9:34 AM

## 2018-07-01 NOTE — Progress Notes (Signed)
PIV was removed from [place], slight edema noted. Pt denies any pain at site. Discharge information given to pt and allowed time for questions. Pt denies questions or concerns at this time. Pt discharged to home accompanied by his wife

## 2018-07-01 NOTE — Discharge Instructions (Signed)
Lawrence Liu were admitted with abdominal pain and found to have a poorly functioning gallbladder. Please eat a low fat diet. Please follow up with Dr. Lucia Gaskins for discussion of removal of your gallbladder. Take the pain medication as directed; do not mix with alcohol or other medications that can make you drowsy. Please return if having worsening, uncontrolled symptoms.  It was a pleasure taking care of you.  Cordelia Poche, MD [facility]

## 2018-07-01 NOTE — Progress Notes (Signed)
The patient is receiving Protonix by the intravenous route.  Based on criteria approved by the Pharmacy and [facility], the medication is being converted to the equivalent oral dose form.  These criteria include: -No active GI bleeding -Able to tolerate diet of full liquids (or better) or tube feeding -Able to tolerate other medications by the oral or enteral route  If you have any questions about this conversion, please contact the Pharmacy Department (phone [phone]).  Thank you.  Sherlon Handing, PharmD, BCPS Clinical pharmacist 07/01/2018 9:39 AM

## 2018-07-04 DIAGNOSIS — K828 Other specified diseases of gallbladder: Secondary | ICD-10-CM | POA: Diagnosis not present

## 2018-07-04 DIAGNOSIS — K219 Gastro-esophageal reflux disease without esophagitis: Secondary | ICD-10-CM | POA: Diagnosis not present

## 2018-07-04 DIAGNOSIS — R933 Abnormal findings on diagnostic imaging of other parts of digestive tract: Secondary | ICD-10-CM | POA: Diagnosis not present

## 2018-07-04 DIAGNOSIS — K760 Fatty (change of) liver, not elsewhere classified: Secondary | ICD-10-CM | POA: Diagnosis not present

## 2018-07-04 DIAGNOSIS — R740 Nonspecific elevation of levels of transaminase and lactic acid dehydrogenase [LDH]: Secondary | ICD-10-CM | POA: Diagnosis not present

## 2018-07-05 ENCOUNTER — Telehealth: Payer: Self-pay | Admitting: Cardiology

## 2018-07-05 NOTE — Telephone Encounter (Signed)
   [facility] Pre-operative Risk Assessment    Request for surgical clearance:  1. What type of surgery is being performed? Laparoscopic cholecystectomy    2. When is this surgery scheduled? TBD   3. What type of clearance is required (medical clearance vs. Pharmacy clearance to hold med vs. Both)? Medical   4. Are there any medications that need to be held prior to surgery and how long? None specified    5. Practice name and name of physician performing surgery? Dr. Alphonsa Overall @ [facility]    6. What is your office phone number [phone]     7.   What is your office fax number [phone] Attn: April Staton CMA  8.   Anesthesia type (None, local, MAC, general) ? General    Sheral Apley M 07/05/2018, 8:01 AM  _________________________________________________________________   (provider comments below)

## 2018-07-06 NOTE — Telephone Encounter (Signed)
   Primary Cardiologist: Glenetta Hew, MD  Chart reviewed as part of pre-operative protocol coverage. Patient has appointment on 12/9 for pre-op clearance already. Per office protocol, the provider should assess clearance at time of office visit and should forward their finalized clearance decision to requesting party below. I will remove this message from the pre-op box.   Charlie Pitter, PA-C 07/06/2018, 5:16 PM

## 2018-07-09 NOTE — Progress Notes (Signed)
Cardiology Office Note   Date:  07/10/2018   ID:  Lawrence Liu, DOB 02-Jan-1956, MRN [id number]  PCP:  [name], MD  Cardiologist: Dr. Ellyn Hack  Chief Complaint  Patient presents with  . Pre-op Exam    Colestectomy  . Atrial Fibrillation  . AAA    Repaired in 08/2017      History of Present Illness: Lawrence Liu is a 62 y.o. male who presents for preoperative cardiac evaluation.  He has a history of AAA status post repair in January 2019.  Postoperatively the patient had a run of atrial fibrillation and was converted on amiodarone.    He was last seen by Dr. Ellyn Hack in February 2019 and was asymptomatic and remained in normal sinus rhythm.  He had no complaints of irregular heart rhythm.  His only complaint was shortness of breath when he had to pull a full garbage can up a hill to the side of the road.  He had amlodipine changed to 2.5 mg due to dizziness and hypotension on 10/13/2017.  The patient was admitted in November 2019 with abdominal pain, and was diagnosed with calculus cholecystitis.  He was discharged on 07/01/2018 after final diagnosis of biliary dyskinesia.  He is planned for a cholecystectomy with Dr Alphonsa Overall.   He denies chest pain, DOE, dizziness. He is anxious to have surgery.   Past Medical History:  Diagnosis Date  . Alpha galactosidase deficiency    multiple tick bites  . Deep vein thrombosis ([place]) 2004   Left deep vein thrombosis of left calf  . GERD (gastroesophageal reflux disease)   . History of blood transfusion    as a baby for jaundice  . Hypertension   . 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 No lesions greater than 30% noted. - confirmed by Cor CTA  . Pneumonia 1980's   walking pneumonia  . Status post ascending aortic aneurysm repair 08/2016   Dr. Cyndia Bent  . Thoracic ascending aortic aneurysm ([place]) 11/06/2013   CTA of chest November 2017 showed notable increase in size --> s/p Open  (Valve Sparing) Repair Jan 2019)    Past Surgical History:  Procedure Laterality Date  . ASCENDING AORTIC ROOT REPLACEMENT N/A 08/15/2017   Procedure: ASCENDING AORTIC ROOT REPLACEMENT, (VALVE SPARING ROOT REPLACEMENT);  Surgeon: [name], MD;  Location: [facility];  Service: Open Heart Surgery;  Laterality: N/A;  . COLONOSCOPY WITH PROPOFOL N/A 11/23/2016   Procedure: COLONOSCOPY WITH PROPOFOL;  Surgeon: Juanita Craver, MD;  Location: [facility];  Service: Endoscopy;  Laterality: N/A;  . colonscopy  5 1/2 yrs ago   polyps removed  . EYE SURGERY Bilateral   . LEFT HEART CATHETERIZATION WITH CORONARY ANGIOGRAM N/A 10/03/2013   Procedure: LEFT HEART CATHETERIZATION WITH CORONARY ANGIOGRAM;  Surgeon: Jolaine Artist, MD;  Location[facility];  Service: Cardiovascular:  Nonobstructive disease - LAD diffuse 20-30%. Small distal vessel. Circumflex ostial 30-40%. Large OM 1 with 30% mid. Small OM 2 and several small PL branches. Large dominant RCA with 30% mid and diffuse 20-30% distal.  . SHOULDER ARTHROSCOPY  07/15/2011   Procedure: ARTHROSCOPY SHOULDER;  Surgeon: Metta Clines Supple;  Location: [facility];  Service: Orthopedics;  Laterality: Right;  RIGHT ARTHROSCOPY SUBACROMIAL DECOMPRESSION AND DISTAL CLAVICLE RESECTION  . TEE WITHOUT CARDIOVERSION N/A 08/15/2017   Procedure: TRANSESOPHAGEAL ECHOCARDIOGRAM (TEE);  Surgeon: [name], MD;  Location: [facility];  Service: Open Heart Surgery;  Laterality: N/A;  .  TONSILLECTOMY  ~ 1970   adenoids also  . TRANSTHORACIC ECHOCARDIOGRAM  March 2015   Upper limit size LV. Mild LVH. EF 60%. Normal wall motion. Ascending aorta measures 48 mm at root tapering to 40 mm at proximal ascending. Mild RV dilation.     Current Outpatient Medications  Medication Sig Dispense Refill  . amLODipine-benazepril (LOTREL) 5-20 MG capsule Take 1 capsule by mouth daily. 30 capsule 12  . Ascorbic Acid (VITAMIN C) 1000 MG tablet Take 1,000 mg by mouth daily.    Marland Kitchen aspirin EC 325  MG EC tablet Take 1 tablet (325 mg total) by mouth daily. 30 tablet 0  . atorvastatin (LIPITOR) 20 MG tablet TAKE 1 TABLET (20 MG TOTAL) BY MOUTH DAILY. 90 tablet 3  . Cholecalciferol (VITAMIN D3) 5000 units CAPS Take 5,000 Units by mouth daily.    . Coenzyme Q10 (CO Q-10) 300 MG CAPS Take 300 mg by mouth daily.    . Cyanocobalamin (VITAMIN B-12) 5000 MCG TBDP Take 5,000 mcg by mouth daily.     . famotidine (PEPCID) 20 MG tablet Take 20 mg by mouth daily.    . fexofenadine (ALLEGRA) 180 MG tablet Take 180 mg by mouth daily.    Marland Kitchen HYDROcodone-acetaminophen (NORCO/VICODIN) 5-325 MG tablet Take 1-2 tablets by mouth every 6 (six) hours as needed for moderate pain. 20 tablet 0  . metoprolol succinate (TOPROL-XL) 100 MG 24 hr tablet Take 100 mg by mouth daily.   3  . Probiotic Product (PROBIOTIC PO) Take 1 tablet by mouth daily. PROBIOTICS 10    . vitamin E 400 UNIT capsule Take 800 Units by mouth daily.      No current facility-administered medications for this visit.     Allergies:   Latex; Penicillins; and Sulfa antibiotics    Social History:  The patient  reports that he has never smoked. He has never used smokeless tobacco. He reports that he drinks alcohol. He reports that he does not use drugs.   Family History:  The patient's family history includes Diabetes in his father and mother; Heart disease in his father.    ROS: All other systems are reviewed and negative. Unless otherwise mentioned in H&P    PHYSICAL EXAM: VS:  There were no vitals taken for this visit. , BMI There is no height or weight on file to calculate BMI. GEN: Well nourished, well developed, in no acute distress HEENT: normal Neck: no JVD, carotid bruits, or masses Cardiac: RRR; no murmurs, rubs, or gallops,no edema  Respiratory:  Clear to auscultation bilaterally, normal work of breathing GI: soft, nontender, nondistended, + BS MS: no deformity or atrophy Skin: warm and dry, no rash Neuro:  Strength and  sensation are intact Psych: euthymic mood, full affect   EKG: SR with occasional PVC's. Non-specific T wave abnormalities. Rate of 70.   Recent Labs: 08/16/2017: Magnesium 2.2 06/29/2018: Hemoglobin 12.8; Platelets 191 06/30/2018: ALT 80; BUN 10; Creatinine, Ser 0.96; Potassium 3.8; Sodium 139    Lipid Panel    Component Value Date/Time   CHOL 105 10/18/2016 0956   TRIG 122 10/18/2016 0956   HDL 34 (L) 10/18/2016 0956   CHOLHDL 3.1 10/18/2016 0956   CHOLHDL 2.3 07/11/2015 1227   VLDL 16 07/11/2015 1227   LDLCALC 47 10/18/2016 0956      Wt Readings from Last 3 Encounters:  06/29/18 209 lb 10.5 oz (95.1 kg)  05/03/18 215 lb 9.6 oz (97.8 kg)  11/09/17 209 lb (94.8 kg)  Other studies Reviewed: Echocardiogram Nov 19, 2017 Left ventricle: The cavity size was normal. Wall thickness was   normal. Systolic function was normal. The estimated ejection   fraction was in the range of 50% to 55%. Wall motion was normal;   there were no regional wall motion abnormalities. Left   ventricular diastolic function parameters were normal. - Ventricular septum: Septal motion showed paradox. These changes   are consistent with a post-thoracotomy state. - Mitral valve: There was mild regurgitation. - Left atrium: The atrium was moderately dilated.    ASSESSMENT AND PLAN:  1. Pre-Operative Evaluation:    Chart reviewed as part of pre-operative protocol coverage. Given past medical history and time since last visit, based on ACC/AHA guidelines, Shigeru A Manard would be at acceptable risk for the planned colecystectomy without further cardiovascular testing.  will route this recommendation to the requesting party via [place] fax function  2. Paroxysmal Atrial fibrillation: He is in NSR, not on anticoagulation.  On metoprolol.   3. AAA: S/P repair in 2019. He will be reduced on EASA to 81 mg daily.   Current medicines are reviewed at length with the patient today.    Labs/ tests ordered  today include: None   Phill Myron. West Pugh, ANP, AACC   07/10/2018 8:17 AM    [facility] [place] [address] Office [phone] Fax [phone]

## 2018-07-10 ENCOUNTER — Ambulatory Visit (INDEPENDENT_AMBULATORY_CARE_PROVIDER_SITE_OTHER): Payer: 59 | Admitting: Adult Health

## 2018-07-10 ENCOUNTER — Encounter: Payer: Self-pay | Admitting: Adult Health

## 2018-07-10 VITALS — BP 124/83 | HR 78 | Ht 73.0 in | Wt 217.8 lb

## 2018-07-10 DIAGNOSIS — Z01810 Encounter for preprocedural cardiovascular examination: Secondary | ICD-10-CM

## 2018-07-10 DIAGNOSIS — I493 Ventricular premature depolarization: Secondary | ICD-10-CM | POA: Diagnosis not present

## 2018-07-10 DIAGNOSIS — I480 Paroxysmal atrial fibrillation: Secondary | ICD-10-CM

## 2018-07-10 DIAGNOSIS — I714 Abdominal aortic aneurysm, without rupture, unspecified: Secondary | ICD-10-CM

## 2018-07-10 MED ORDER — ASPIRIN EC 81 MG PO TBEC: 325.0000 mg | DELAYED_RELEASE_TABLET | Freq: Every day | ORAL | Status: DC

## 2018-07-10 MED ORDER — METOPROLOL SUCCINATE ER 100 MG PO TB24: 100.0000 mg | ORAL_TABLET | Freq: Every day | ORAL | 3 refills | Status: DC

## 2018-07-10 MED ORDER — ATORVASTATIN CALCIUM 20 MG PO TABS: 20.0000 mg | ORAL_TABLET | Freq: Every day | ORAL | 3 refills | Status: DC

## 2018-07-10 MED FILL — METOPROLOL SUCCINATE ER 100: 100 | 90 days supply | Qty: 90 | Fill #0

## 2018-07-10 MED FILL — ATORVASTATIN CALCIUM 20 MG: 20 | 90 days supply | Qty: 90 | Fill #0

## 2018-07-10 NOTE — Patient Instructions (Signed)
Medication Instructions:  NO CHANGES- Your physician recommends that you continue on your current medications as directed. Please refer to the Current Medication list given to you today. If you need a refill on your cardiac medications before your next appointment, please call your pharmacy. Labwork: If you have labs (blood work) drawn today and your tests are completely normal, you will receive your results only by: Marland Kitchen MyChart Message (if you have MyChart) OR . A paper copy in the mail If you have any lab test that is abnormal or we need to change your treatment, we will call you to review the results. Special Instructions: CLEARED FOR GALLBLADDER REMOVAL SURGERY   Follow-Up: You will need a follow up appointment in 6MONTHS.  Please call our office 2 months in advance(APRIL 2020) to schedule the (June 2020) appointment.  You may see  DR Cecil Cranker, DNP, AACC or one of the following Advanced Practice Providers on your designated Care Team:  Rosaria Ferries, [place], DNP, ANP  At [facility], you and your health needs are our priority.  As part of our continuing mission to provide you with exceptional heart care, we have created designated Provider Care Teams.  These Care Teams include your primary Cardiologist (physician) and Advanced Practice Providers (APPs -  Physician Assistants and Nurse Practitioners) who all work together to provide you with the care you need, when you need it.

## 2018-07-11 ENCOUNTER — Encounter (HOSPITAL_COMMUNITY): Admission: RE | Admit: 2018-07-11 | Payer: 59 | Source: Ambulatory Visit

## 2018-07-14 ENCOUNTER — Other Ambulatory Visit: Payer: Self-pay | Admitting: *Deleted

## 2018-07-14 NOTE — Patient Outreach (Signed)
[facility]) Care Management  07/14/2018  TRYSTON GILLIAM [birth year] [id number]      Case transitioned to Kelli Churn @ [place] Management for Transition of care follow up.    Hassell Patras H. Annia Friendly, BSN, [place] Management [facility] Telephonic CM Phone: [phone] Fax: [phone]

## 2018-07-17 ENCOUNTER — Other Ambulatory Visit: Payer: Self-pay | Admitting: *Deleted

## 2018-07-17 NOTE — Patient Outreach (Addendum)
[facility]) Care Management  07/17/2018  DUNG SALINGER 11-27-1955 [id number]   Transition of care telephone call:  Initial unsuccessful telephone call to patient's preferred number in order to complete transition of care assessment; no answer, left HIPAA compliant voicemail message requesting return call.  Mr Sooy was hospitalized at [facility] from 11/27- 07/01/18 with acute acalculous cholecystitis. He was discharged to home on 11/30.  He is scheduled for laparoscopic cholecystectomy on 07/24/18. This RNCM will attempt another outreach within 4 business days and will route unsuccessful outreach letter to [place] Management clinical pool to be mailed to patient's home address.  Barrington Ellison RN,CCM,CDE [place] Management Coordinator Office Phone [phone] Office Fax [phone]

## 2018-07-17 NOTE — Progress Notes (Signed)
07-10-18 ( Epic) Cardiac Clearance from Marvis Moeller, NP   07-10-18 (Epic) EKG  06-28-18 (Epic) CXR  10-21-17 (Epic) ECHO  08-12-17 ( Epic) Doppler

## 2018-07-17 NOTE — Patient Instructions (Addendum)
Lawrence Liu  07/17/2018   Your procedure is scheduled on: 07-24-18    Report to [facility] Main  Entrance    Report to Admitting at 5:30 AM    Call this number if you have problems the morning of surgery [phone]    Remember: NO SOLID FOOD AFTER MIDNIGHT THE NIGHT PRIOR TO SURGERY. NOTHING BY MOUTH EXCEPT CLEAR LIQUIDS UNTIL 3 HOURS PRIOR TO [place] SURGERY. PLEASE FINISH ENSURE DRINK PER SURGEON ORDER 3 HOURS PRIOR TO SCHEDULED SURGERY TIME WHICH NEEDS TO BE COMPLETED AT 4:30 AM_.    CLEAR LIQUID DIET   Foods Allowed                                                                     Foods Excluded  Coffee and tea, regular and decaf                             liquids that you cannot  Plain Jell-O in any flavor                                             see through such as: Fruit ices (not with fruit pulp)                                     milk, soups, orange juice  Iced Popsicles                                    All solid food Carbonated beverages, regular and diet                                    Cranberry, grape and apple juices Sports drinks like Gatorade Lightly seasoned clear broth or consume(fat free) Sugar, honey syrup  Sample Menu Breakfast                                Lunch                                     Supper Cranberry juice                    Beef broth                            Chicken broth Jell-O                                     Grape juice  Apple juice Coffee or tea                        Jell-O                                      Popsicle                                                Coffee or tea                        Coffee or tea  _____________________________________________________________________    BRUSH YOUR TEETH MORNING OF SURGERY AND RINSE YOUR MOUTH OUT, NO CHEWING GUM CANDY OR MINTS.     Take these medicines the morning of surgery with A SIP OF WATER:  Atorvastatin (Lipitor), Fexofenadine (Allegra) and Metoprolol Succinate (Toprol), and Famotidine (Pepcid)                                You may not have any metal on your body including hair pins and              piercings  Do not wear jewelry, lotions, cologne,  powders or deodorant             Men may shave face and neck.   Do not bring valuables to the hospital. [place].  Contacts, dentures or bridgework may not be worn into surgery.  Leave suitcase in the car. After surgery it may be brought to your room.   Special Instructions: N/A              Please read over the following fact sheets you were given: _____________________________________________________________________             [facility] - Preparing for Surgery Before surgery, you can play an important role.  Because skin is not sterile, your skin needs to be as free of germs as possible.  You can reduce the number of germs on your skin by washing with CHG (chlorahexidine gluconate) soap before surgery.  CHG is an antiseptic cleaner which kills germs and bonds with the skin to continue killing germs even after washing. Please DO NOT use if you have an allergy to CHG or antibacterial soaps.  If your skin becomes reddened/irritated stop using the CHG and inform your nurse when you arrive at Short Stay. Do not shave (including legs and underarms) for at least 48 hours prior to the first CHG shower.  You may shave your face/neck. Please follow these instructions carefully:  1.  Shower with CHG Soap the night before surgery and the  morning of Surgery.  2.  If you choose to wash your hair, wash your hair first as usual with your  normal  shampoo.  3.  After you shampoo, rinse your hair and body thoroughly to remove the  shampoo.                           4.  Use CHG as you  would any other liquid soap.  You can apply chg directly  to the skin and wash                       Gently  with a scrungie or clean washcloth.  5.  Apply the CHG Soap to your body ONLY FROM THE NECK DOWN.   Do not use on face/ open                           Wound or open sores. Avoid contact with eyes, ears mouth and genitals (private parts).                       Wash face,  Genitals (private parts) with your normal soap.             6.  Wash thoroughly, paying special attention to the area where your surgery  will be performed.  7.  Thoroughly rinse your body with warm water from the neck down.  8.  DO NOT shower/wash with your normal soap after using and rinsing off  the CHG Soap.                9.  Pat yourself dry with a clean towel.            10.  Wear clean pajamas.            11.  Place clean sheets on your bed the night of your first shower and do not  sleep with pets. Day of Surgery : Do not apply any lotions/deodorants the morning of surgery.  Please wear clean clothes to the hospital/surgery center.  FAILURE TO FOLLOW THESE INSTRUCTIONS MAY RESULT IN THE CANCELLATION OF YOUR SURGERY PATIENT SIGNATURE_________________________________  NURSE SIGNATURE__________________________________  ________________________________________________________________________

## 2018-07-19 ENCOUNTER — Other Ambulatory Visit: Payer: Self-pay | Admitting: Surgery

## 2018-07-19 DIAGNOSIS — K8050 Calculus of bile duct without cholangitis or cholecystitis without obstruction: Secondary | ICD-10-CM | POA: Diagnosis not present

## 2018-07-20 ENCOUNTER — Ambulatory Visit: Payer: 59 | Admitting: *Deleted

## 2018-07-20 ENCOUNTER — Encounter (HOSPITAL_COMMUNITY)
Admission: RE | Admit: 2018-07-20 | Discharge: 2018-07-20 | Disposition: A | Discharge status: Home / Self Care | Payer: 59 | Source: Ambulatory Visit | Attending: Surgery | Admitting: Surgery

## 2018-07-20 ENCOUNTER — Other Ambulatory Visit: Payer: Self-pay

## 2018-07-20 ENCOUNTER — Encounter (HOSPITAL_COMMUNITY): Payer: Self-pay

## 2018-07-20 ENCOUNTER — Other Ambulatory Visit: Payer: Self-pay | Admitting: *Deleted

## 2018-07-20 DIAGNOSIS — Z01812 Encounter for preprocedural laboratory examination: Secondary | ICD-10-CM | POA: Insufficient documentation

## 2018-07-20 DIAGNOSIS — K8050 Calculus of bile duct without cholangitis or cholecystitis without obstruction: Secondary | ICD-10-CM | POA: Insufficient documentation

## 2018-07-20 LAB — COMPREHENSIVE METABOLIC PANEL
ALT: 24 U/L (ref 0–44)
AST: 20 U/L (ref 15–41)
Albumin: 4.1 g/dL (ref 3.5–5.0)
Alkaline Phosphatase: 67 U/L (ref 38–126)
Anion gap: 9 (ref 5–15)
BUN: 23 mg/dL — ABNORMAL HIGH (ref 6–20)
CO2: 23 mmol/L (ref 22–32)
Calcium: 8.9 mg/dL (ref 8.9–10.3)
Chloride: 109 mmol/L (ref 98–111)
Creatinine, Ser: 0.97 mg/dL (ref 0.61–1.24)
GFR calc Af Amer: >60 mL/min (ref >60)
Glucose, Bld: 114 mg/dL — ABNORMAL HIGH (ref 70–99)
Potassium: 3.8 mmol/L (ref 3.5–5.1)
Sodium: 141 mmol/L (ref 135–145)
Total Bilirubin: 0.6 mg/dL (ref 0.3–1.2)
Total Protein: 6.9 g/dL (ref 6.5–8.1)

## 2018-07-20 LAB — CBC
HCT: 43.6 % (ref 39.0–52.0)
Hemoglobin: 13.8 g/dL (ref 13.0–17.0)
MCH: 26.1 pg (ref 26.0–34.0)
MCHC: 31.7 g/dL (ref 30.0–36.0)
MCV: 82.4 fL (ref 80.0–100.0)
Platelets: 212 10*3/uL (ref 150–400)
RBC: 5.29 MIL/uL (ref 4.22–5.81)
RDW: 13.5 % (ref 11.5–15.5)
WBC: 5.4 10*3/uL (ref 4.0–10.5)
nRBC: 0 % (ref 0.0–0.2)

## 2018-07-20 NOTE — Patient Outreach (Signed)
[place] [facility]) Care Management  07/20/2018  [name] 1955/10/21 [id number]   Transition of care telephone call:  Second unsuccessful telephone call to patient's preferred number (home) with purpose of completing transition of care assessment; no answer, left HIPAA compliant voicemail message requesting return call.  Lawrence Liu was hospitalized at [facility] from 11/27- 07/01/18 with acute acalculous cholecystitis. He was discharged to home on 11/30.  He is scheduled for laparoscopic cholecystectomy on 07/24/18. If no return call from Lawrence. Genis, will outreach after his discharge from surgery on 07/24/18.  Barrington Ellison RN,CCM,CDE [place] Management Coordinator Office Phone [phone] Office Fax [phone]

## 2018-07-23 MED ORDER — BUPIVACAINE LIPOSOME 1.3 % IJ SUSP
20.0000 mL | INTRAMUSCULAR | Status: DC
  Filled 2018-07-23: qty 20

## 2018-07-23 NOTE — Anesthesia Preprocedure Evaluation (Addendum)
Anesthesia Evaluation  Patient identified by MRN, date of birth, ID band Patient awake    Reviewed: Allergy & Precautions, NPO status , Patient's Chart, lab work & pertinent test results  Airway Mallampati: III  TM Distance: >3 FB Neck ROM: Full  Mouth opening: Limited Mouth Opening  Dental no notable dental hx. (+) Teeth Intact, Dental Advisory Given   Pulmonary neg pulmonary ROS,    Pulmonary exam normal breath sounds clear to auscultation       Cardiovascular hypertension, Pt. on home beta blockers and Pt. on medications + CAD and + DVT  Normal cardiovascular exam Rhythm:Regular Rate:Normal  Thoracic ascending aortic aneurysm s/p valve sparing repair 08/2017  TTE 09/2017 - Left ventricle: The cavity size was normal. Wall thickness was normal. Systolic function was normal. The estimated ejection fraction was in the range of 50% to 55%. Wall motion was normal; there were no regional wall motion abnormalities. Left ventricular diastolic function parameters were normal. - Ventricular septum: Septal motion showed paradox. These changes are consistent with a post-thoracotomy state. - Mitral valve: There was mild regurgitation. - Left atrium: The atrium was moderately dilated.  CT coronary 2018 RCA is a large dominant artery that gives rise to PDA and PLVB. There is minimal diffuse calcified plaque with associated stenosis 25-50%.  LAD is a large vessel that has gives rise to one small diagonal artery. There is mild diffuse calcified plaque in the proximal to mid LAD with maximum stenosis 25-50%.  LCX is a non-dominant artery that gives rise to one large OM1 branch. There is mild diffuse calcified plaque in the OM branch with maximum stenosis 25-50%.   Neuro/Psych negative neurological ROS  negative psych ROS   GI/Hepatic Neg liver ROS, GERD  Medicated,  Endo/Other  negative endocrine ROS  Renal/GU negative Renal ROS  negative  genitourinary   Musculoskeletal negative musculoskeletal ROS (+)   Abdominal   Peds  Hematology negative hematology ROS (+)   Anesthesia Other Findings Biliary dyskinesia  Reproductive/Obstetrics                            Anesthesia Physical Anesthesia Plan  ASA: III  Anesthesia Plan: General   Post-op Pain Management:    Induction: Intravenous  PONV Risk Score and Plan: 2 and Ondansetron, Dexamethasone and Midazolam  Airway Management Planned: Oral ETT  Additional Equipment:   Intra-op Plan:   Post-operative Plan: Extubation in OR  Informed Consent: I have reviewed the patients History and Physical, chart, labs and discussed the procedure including the risks, benefits and alternatives for the proposed anesthesia with the patient or authorized representative who has indicated his/her understanding and acceptance.   Dental advisory given  Plan Discussed with: CRNA  Anesthesia Plan Comments:         Anesthesia Quick Evaluation

## 2018-07-23 NOTE — H&P (Signed)
Lawrence Liu  Location: [facility] Patient #: [id number] DOB: [birth year] Married / Language: English / Race: White Male  History of Present Illness   The patient is a 62 year old male who presents with a complaint of gall bladder disease.  The PCP is Dr. Raford Pitcher  The patient was referred by Dr. Verdia Kuba  The patient has had some GI symptoms on and off for one year. He had some vague abdominal pain after his aneurysm repair that he described as a "belly bug". Then in August he was admitted [facility] with epigastric pain to rule out cardiac issues. His ultrasound at that time was negative. He then had his worst attack at the end of November, at which she was again admitted to [facility].  Most recetnly, he was admitted 06/29/2018 - 07/01/2018 for biliary dyskinesia. He had a HIDA scan on 06/30/2018 which showed a 13% EF. [place] on 03/22/2018 and 06/28/2018 showed no gallstones.  He has had an upper endoscopy by Dr. Collene Mares about 5 years ago. He is treated his GERD with Pepcid, which has done well. He had a colonoscopy about a year and a half ago by Dr. Collene Mares.  He comes with biliary dyskinesia.  He was seen by Jory Sims, NP, for cardiology clearance.  I discussed with the patient the indications and risks of gall bladder surgery. The primary risks of gall bladder surgery include, but are not limited to, bleeding, infection, common bile duct injury, and open surgery. There is also the risk that the patient may have continued symptoms after surgery. We discussed the typical post-operative recovery course. I tried to answer the patient's questions. We discussed that cholecystectomy for biliary dyskinesia does not guarentee resolution of symptoms. About 50% of people continue have symptoms. I gave the patient literature about gall bladder surgery.  Plan: 1) Lap chole on 12/23  Review of Systems as stated in this history (HPI) or in the  review of systems. Otherwise all other 12 point ROS are negative  Past Medical History: 1. HTN 2. DVT 2004 3. GERD 4. Thoracic ascending aortic aneurysm status post sternotomy and valve sparing aortic surgery, ascending aortic aneurysm repair - Jan 2019 - B. Bartle He has done well from this 5. Bilateral hearing aids. 6. He takes a baby aspirin a day  Social History: Married. Wife, Joycelyn Schmid, is an [place] tech in [place] Has 3 children between them. All grown.  He works with the EMS.  Past Surgical History Nance Pew, CMA; 07/19/2018 9:58 AM) Aneurysm Repair  Cataract Surgery  Bilateral. Colon Polyp Removal - Colonoscopy  Foot Surgery  Left. Shoulder Surgery  Right. Tonsillectomy   Diagnostic Studies History Nance Pew, CMA; 07/19/2018 9:58 AM) Colonoscopy  1-5 years ago  Allergies Nance Pew, CMA; 07/19/2018 9:59 AM) Sulfa Drugs  Difficulty breathing. Penicillins  Difficulty swallowing, Difficulty breathing. Allergies Reconciled   Medication History (Sabrina Canty, CMA; 07/19/2018 10:02 AM) amLODIPine Besy-Benazepril HCl (5-20MG  Capsule, Oral) Active. Atorvastatin Calcium (20MG  Tablet, Oral) Active. Azithromycin (250MG  Tablet, Oral) Active. Metoprolol Succinate ER (100MG  Tablet ER 24HR, Oral) Active. Famotidine (10MG  Tablet, Oral) Active. Coenzyme Q-10 (60MG  Capsule, Oral) Active. Probiotic (Oral) Active. Vitamin E (200UNIT Capsule, 1 (one) Oral) Active. Medications Reconciled  Social History Nance Pew, CMA; 07/19/2018 9:58 AM) Alcohol use  Occasional alcohol use. Caffeine use  Carbonated beverages, Tea. No drug use  Tobacco use  Never smoker.  Family History Nance Pew, [place]; 07/19/2018 9:58 AM) Cerebrovascular Accident  Father, Mother. Colon  Polyps  Father. Diabetes Mellitus  Mother. Hypertension  Brother, Father, Mother, Sister. Migraine Headache  Sister. Respiratory Condition   Father.  Other Problems Nance Pew, CMA; 07/19/2018 9:58 AM) Gastroesophageal Reflux Disease  Hemorrhoids  High blood pressure  Transfusion history   Review of Systems ([place]; 07/19/2018 9:58 AM) General Not Present- Appetite Loss, Chills, Fatigue, Fever, Night Sweats, Weight Gain and Weight Loss. Skin Not Present- Change in Wart/Mole, Dryness, Hives, Jaundice, New Lesions, Non-Healing Wounds, Rash and Ulcer. HEENT Present- Hearing Loss. Not Present- Earache, Hoarseness, Nose Bleed, Oral Ulcers, Ringing in the Ears, Seasonal Allergies, Sinus Pain, Sore Throat, Visual Disturbances, Wears glasses/contact lenses and Yellow Eyes. Respiratory Not Present- Bloody sputum, Chronic Cough, Difficulty Breathing, Snoring and Wheezing. Cardiovascular Not Present- Chest Pain, Difficulty Breathing Lying Down, Leg Cramps, Palpitations, Rapid Heart Rate, Shortness of Breath and Swelling of Extremities. Gastrointestinal Present- Abdominal Pain, Bloating and Indigestion. Not Present- Bloody Stool, Change in Bowel Habits, Chronic diarrhea, Constipation, Difficulty Swallowing, Excessive gas, Gets full quickly at meals, Hemorrhoids, Nausea, Rectal Pain and Vomiting. Male Genitourinary Not Present- Blood in Urine, Change in Urinary Stream, Frequency, Impotence, Nocturia, Painful Urination, Urgency and Urine Leakage. Musculoskeletal Not Present- Back Pain, Joint Pain, Joint Stiffness, Muscle Pain, Muscle Weakness and Swelling of Extremities. Neurological Not Present- Decreased Memory, Fainting, Headaches, Numbness, Seizures, Tingling, Tremor, Trouble walking and Weakness. Psychiatric Not Present- Anxiety, Bipolar, Change in Sleep Pattern, Depression, Fearful and Frequent crying. Endocrine Not Present- Cold Intolerance, Excessive Hunger, Hair Changes, Heat Intolerance, Hot flashes and New Diabetes. Hematology Not Present- Blood Thinners, Easy Bruising, Excessive bleeding, Gland problems, HIV and  Persistent Infections.  Vitals (Sabrina Canty CMA; 07/19/2018 10:02 AM) 07/19/2018 10:02 AM Weight: 219.5 lb Height: 75in Body Surface Area: 2.28 m Body Mass Index: 27.44 kg/m  Temp.: 97.3F  Pulse: 89 (Regular)  P.OX: 98% (Room air) BP: 116/66 (Sitting, Left Arm, Standard)  Physical Exam  General: WN WM who is alert and generally healthy appearing. HEENT: Normal. Pupils equal.  Neck: Supple. No mass. No thyroid mass. Lymph Nodes: No supraclavicular or cervical nodes.  Lungs: Clear to auscultation and symmetric breath sounds. He may have a supernumery nipple on the right, just below his nipple. Median sternotomy scar. Heart: RRR. No murmur or rub.  Abdomen: Soft. No mass. No tenderness. No hernia. Normal bowel sounds. Upper abdominal scars from the chest tubes. Rectal: Not done.  Extremities: Good strength and ROM in upper and lower extremities.  Neurologic: Grossly intact to motor and sensory function. Psychiatric: Has normal mood and affect. Behavior is normal.  Assessment & Plan  1.  BILIARY COLIC (S50.53) - positive HIDA scan  Plan:  1) Lap chole on Monday, 07/24/2018  2. HTN 3. History of DVT 2004 4. GERD 5. Thoracic ascending aortic aneurysm  status post sternotomy and valve sparing aortic surgery, ascending aortic aneurysm repair - Jan 2019 - B. Bartle 6. Bilateral hearing aids. 7. He takes a baby aspirin a day   Alphonsa Overall, MD, [facility] Surgery Pager: [phone] Office phone:  [phone]

## 2018-07-24 ENCOUNTER — Ambulatory Visit (HOSPITAL_COMMUNITY): Payer: 59 | Admitting: Anesthesiology

## 2018-07-24 ENCOUNTER — Ambulatory Visit (HOSPITAL_COMMUNITY)
Admission: RE | Admit: 2018-07-24 | Discharge: 2018-07-24 | Disposition: A | Discharge status: Home / Self Care | Payer: 59 | Attending: Surgery | Admitting: Surgery

## 2018-07-24 ENCOUNTER — Encounter (HOSPITAL_COMMUNITY): Payer: Self-pay

## 2018-07-24 ENCOUNTER — Ambulatory Visit (HOSPITAL_COMMUNITY): Payer: 59

## 2018-07-24 ENCOUNTER — Encounter (HOSPITAL_COMMUNITY)
Admission: RE | Disposition: A | Discharge status: Home / Self Care | Payer: Self-pay | Source: Home / Self Care | Attending: Surgery

## 2018-07-24 DIAGNOSIS — Z86718 Personal history of other venous thrombosis and embolism: Secondary | ICD-10-CM | POA: Diagnosis not present

## 2018-07-24 DIAGNOSIS — I10 Essential (primary) hypertension: Secondary | ICD-10-CM | POA: Insufficient documentation

## 2018-07-24 DIAGNOSIS — K219 Gastro-esophageal reflux disease without esophagitis: Secondary | ICD-10-CM | POA: Diagnosis not present

## 2018-07-24 DIAGNOSIS — Z7982 Long term (current) use of aspirin: Secondary | ICD-10-CM | POA: Diagnosis not present

## 2018-07-24 DIAGNOSIS — Z79899 Other long term (current) drug therapy: Secondary | ICD-10-CM | POA: Diagnosis not present

## 2018-07-24 DIAGNOSIS — K811 Chronic cholecystitis: Secondary | ICD-10-CM | POA: Diagnosis not present

## 2018-07-24 DIAGNOSIS — E119 Type 2 diabetes mellitus without complications: Secondary | ICD-10-CM | POA: Diagnosis not present

## 2018-07-24 DIAGNOSIS — Z419 Encounter for procedure for purposes other than remedying health state, unspecified: Secondary | ICD-10-CM

## 2018-07-24 DIAGNOSIS — I712 Thoracic aortic aneurysm, without rupture: Secondary | ICD-10-CM | POA: Diagnosis not present

## 2018-07-24 DIAGNOSIS — K8010 Calculus of gallbladder with chronic cholecystitis without obstruction: Secondary | ICD-10-CM | POA: Insufficient documentation

## 2018-07-24 HISTORY — PX: CHOLECYSTECTOMY: SHX55

## 2018-07-24 SURGERY — LAPAROSCOPIC CHOLECYSTECTOMY WITH INTRAOPERATIVE CHOLANGIOGRAM
Anesthesia: General

## 2018-07-24 MED ORDER — HYDROMORPHONE HCL 1 MG/ML IJ SOLN
INTRAMUSCULAR | Status: AC
  Filled 2018-07-24: qty 1

## 2018-07-24 MED ORDER — CHLORHEXIDINE GLUCONATE CLOTH 2 % EX PADS: 6.0000 | MEDICATED_PAD | Freq: Once | CUTANEOUS | Status: DC

## 2018-07-24 MED ORDER — LIDOCAINE 2% (20 MG/ML) 5 ML SYRINGE
INTRAMUSCULAR | Status: DC | PRN
  Administered 2018-07-24: 100 mg via INTRAVENOUS

## 2018-07-24 MED ORDER — PROPOFOL 10 MG/ML IV BOLUS
INTRAVENOUS | Status: DC | PRN
  Administered 2018-07-24: 170 mg via INTRAVENOUS

## 2018-07-24 MED ORDER — ONDANSETRON HCL 4 MG/2ML IJ SOLN
INTRAMUSCULAR | Status: AC
  Filled 2018-07-24: qty 2

## 2018-07-24 MED ORDER — LACTATED RINGERS IV SOLN
INTRAVENOUS | Status: DC
  Administered 2018-07-24 (×2): via INTRAVENOUS

## 2018-07-24 MED ORDER — FENTANYL CITRATE (PF) 100 MCG/2ML IJ SOLN
INTRAMUSCULAR | Status: DC | PRN
  Administered 2018-07-24 (×2): 50 ug via INTRAVENOUS

## 2018-07-24 MED ORDER — LIDOCAINE HCL 2 % IJ SOLN
INTRAMUSCULAR | Status: AC
  Filled 2018-07-24: qty 20

## 2018-07-24 MED ORDER — BUPIVACAINE-EPINEPHRINE (PF) 0.25% -1:200000 IJ SOLN
INTRAMUSCULAR | Status: AC
  Filled 2018-07-24: qty 30

## 2018-07-24 MED ORDER — LIDOCAINE 2% (20 MG/ML) 5 ML SYRINGE
INTRAMUSCULAR | Status: AC
  Filled 2018-07-24: qty 5

## 2018-07-24 MED ORDER — FENTANYL CITRATE (PF) 100 MCG/2ML IJ SOLN
INTRAMUSCULAR | Status: AC
  Filled 2018-07-24: qty 4

## 2018-07-24 MED ORDER — SODIUM CHLORIDE 0.9 % IV SOLN
INTRAVENOUS | Status: DC | PRN
  Administered 2018-07-24: 13 mL

## 2018-07-24 MED ORDER — ACETAMINOPHEN 500 MG PO TABS
ORAL_TABLET | ORAL | Status: AC
  Filled 2018-07-24: qty 2

## 2018-07-24 MED ORDER — MIDAZOLAM HCL 5 MG/5ML IJ SOLN
INTRAMUSCULAR | Status: DC | PRN
  Administered 2018-07-24: 2 mg via INTRAVENOUS

## 2018-07-24 MED ORDER — LIDOCAINE 20MG/ML (2%) 15 ML SYRINGE OPTIME
INTRAMUSCULAR | Status: DC | PRN
  Administered 2018-07-24: 1.5 mg/kg/h via INTRAVENOUS

## 2018-07-24 MED ORDER — CEFAZOLIN SODIUM-DEXTROSE 2-4 GM/100ML-% IV SOLN
2.0000 g | INTRAVENOUS | Status: AC
  Administered 2018-07-24: 2 g via INTRAVENOUS

## 2018-07-24 MED ORDER — HYDROMORPHONE HCL 1 MG/ML IJ SOLN
0.2500 mg | INTRAMUSCULAR | Status: DC | PRN
  Administered 2018-07-24 (×2): 0.5 mg via INTRAVENOUS

## 2018-07-24 MED ORDER — ONDANSETRON HCL 4 MG/2ML IJ SOLN
INTRAMUSCULAR | Status: DC | PRN
  Administered 2018-07-24: 4 mg via INTRAVENOUS

## 2018-07-24 MED ORDER — IOPAMIDOL (ISOVUE-300) INJECTION 61%
INTRAVENOUS | Status: AC
  Filled 2018-07-24: qty 50

## 2018-07-24 MED ORDER — KETAMINE HCL 10 MG/ML IJ SOLN
INTRAMUSCULAR | Status: DC | PRN
  Administered 2018-07-24: 20 mg via INTRAVENOUS

## 2018-07-24 MED ORDER — KETAMINE HCL 10 MG/ML IJ SOLN
INTRAMUSCULAR | Status: AC
  Filled 2018-07-24: qty 1

## 2018-07-24 MED ORDER — DEXAMETHASONE SODIUM PHOSPHATE 10 MG/ML IJ SOLN
INTRAMUSCULAR | Status: DC | PRN
  Administered 2018-07-24: 10 mg via INTRAVENOUS

## 2018-07-24 MED ORDER — DEXAMETHASONE SODIUM PHOSPHATE 10 MG/ML IJ SOLN
INTRAMUSCULAR | Status: AC
  Filled 2018-07-24: qty 1

## 2018-07-24 MED ORDER — SUGAMMADEX SODIUM 200 MG/2ML IV SOLN
INTRAVENOUS | Status: DC | PRN
  Administered 2018-07-24: 200 mg via INTRAVENOUS

## 2018-07-24 MED ORDER — BUPIVACAINE-EPINEPHRINE 0.25% -1:200000 IJ SOLN
INTRAMUSCULAR | Status: DC | PRN
  Administered 2018-07-24: 30 mL

## 2018-07-24 MED ORDER — PROPOFOL 10 MG/ML IV BOLUS
INTRAVENOUS | Status: AC
  Filled 2018-07-24: qty 20

## 2018-07-24 MED ORDER — ACETAMINOPHEN 500 MG PO TABS
1000.0000 mg | ORAL_TABLET | ORAL | Status: AC
  Administered 2018-07-24: 1000 mg via ORAL

## 2018-07-24 MED ORDER — GABAPENTIN 300 MG PO CAPS
300.0000 mg | ORAL_CAPSULE | ORAL | Status: AC
  Administered 2018-07-24: 300 mg via ORAL

## 2018-07-24 MED ORDER — FENTANYL CITRATE (PF) 100 MCG/2ML IJ SOLN
25.0000 ug | INTRAMUSCULAR | Status: DC | PRN
  Administered 2018-07-24 (×3): 50 ug via INTRAVENOUS

## 2018-07-24 MED ORDER — LACTATED RINGERS IR SOLN
Status: DC | PRN
  Administered 2018-07-24: 1000 mL

## 2018-07-24 MED ORDER — MIDAZOLAM HCL 2 MG/2ML IJ SOLN
INTRAMUSCULAR | Status: AC
  Filled 2018-07-24: qty 2

## 2018-07-24 MED ORDER — FENTANYL CITRATE (PF) 100 MCG/2ML IJ SOLN
INTRAMUSCULAR | Status: AC
  Filled 2018-07-24: qty 2

## 2018-07-24 MED ORDER — CEFAZOLIN SODIUM-DEXTROSE 2-4 GM/100ML-% IV SOLN
INTRAVENOUS | Status: AC
  Filled 2018-07-24: qty 100

## 2018-07-24 MED ORDER — ROCURONIUM BROMIDE 10 MG/ML (PF) SYRINGE
PREFILLED_SYRINGE | INTRAVENOUS | Status: DC | PRN
  Administered 2018-07-24: 50 mg via INTRAVENOUS
  Administered 2018-07-24: 10 mg via INTRAVENOUS

## 2018-07-24 MED ORDER — 0.9 % SODIUM CHLORIDE (POUR BTL) OPTIME
TOPICAL | Status: DC | PRN
  Administered 2018-07-24: 1000 mL

## 2018-07-24 MED ORDER — GABAPENTIN 300 MG PO CAPS
ORAL_CAPSULE | ORAL | Status: AC
  Filled 2018-07-24: qty 1

## 2018-07-24 SURGICAL SUPPLY — 43 items
APPLIER CLIP 5 13 M/L LIGAMAX5 (MISCELLANEOUS) ×2
APPLIER CLIP ROT 10 11.4 M/L (STAPLE)
CABLE HIGH FREQUENCY MONO STRZ (ELECTRODE) ×2 IMPLANT
CHLORAPREP W/TINT 26ML (MISCELLANEOUS) ×2 IMPLANT
CHOLANGIOGRAM CATH TAUT (CATHETERS) ×2 IMPLANT
CLIP APPLIE 5 13 M/L LIGAMAX5 (MISCELLANEOUS) ×1 IMPLANT
CLIP APPLIE ROT 10 11.4 M/L (STAPLE) IMPLANT
COVER MAYO STAND STRL (DRAPES) ×2 IMPLANT
COVER SURGICAL LIGHT HANDLE (MISCELLANEOUS) ×2 IMPLANT
COVER WAND RF STERILE (DRAPES) ×2 IMPLANT
DECANTER SPIKE VIAL GLASS SM (MISCELLANEOUS) ×2 IMPLANT
DERMABOND ADVANCED (GAUZE/BANDAGES/DRESSINGS) ×1
DERMABOND ADVANCED .7 DNX12 (GAUZE/BANDAGES/DRESSINGS) ×1 IMPLANT
DRAPE C-ARM 42X120 X-RAY (DRAPES) ×2 IMPLANT
ELECT REM PT RETURN 15FT ADLT (MISCELLANEOUS) ×2 IMPLANT
GLOVE BIOGEL PI IND STRL 6.5 (GLOVE) ×1 IMPLANT
GLOVE BIOGEL PI IND STRL 7.0 (GLOVE) ×2 IMPLANT
GLOVE BIOGEL PI IND STRL 7.5 (GLOVE) ×1 IMPLANT
GLOVE BIOGEL PI INDICATOR 6.5 (GLOVE) ×1
GLOVE BIOGEL PI INDICATOR 7.0 (GLOVE) ×2
GLOVE BIOGEL PI INDICATOR 7.5 (GLOVE) ×1
GOWN STRL REUS W/TWL XL LVL3 (GOWN DISPOSABLE) ×6 IMPLANT
HEMOSTAT SURGICEL 4X8 (HEMOSTASIS) IMPLANT
IV CATH 14GX2 1/4 (CATHETERS) ×2 IMPLANT
IV SET EXTENSION CATH 6 NF (IV SETS) ×2 IMPLANT
KIT BASIN OR (CUSTOM PROCEDURE TRAY) ×2 IMPLANT
POUCH RETRIEVAL ECOSAC 10 (ENDOMECHANICALS) ×1 IMPLANT
POUCH RETRIEVAL ECOSAC 10MM (ENDOMECHANICALS) ×1
SCISSORS LAP 5X35 DISP (ENDOMECHANICALS) ×2 IMPLANT
SET IRRIG TUBING LAPAROSCOPIC (IRRIGATION / IRRIGATOR) ×2 IMPLANT
SLEEVE ADV FIXATION 5X100MM (TROCAR) ×2 IMPLANT
STOPCOCK 4 WAY LG BORE MALE ST (IV SETS) ×2 IMPLANT
STRIP CLOSURE SKIN 1/4X4 (GAUZE/BANDAGES/DRESSINGS) IMPLANT
SUT MNCRL AB 4-0 PS2 18 (SUTURE) ×2 IMPLANT
SUT VICRYL 0 UR6 27IN ABS (SUTURE) ×4 IMPLANT
SYR 10ML ECCENTRIC (SYRINGE) ×2 IMPLANT
TOWEL OR 17X26 10 PK STRL BLUE (TOWEL DISPOSABLE) ×2 IMPLANT
TOWEL OR NON WOVEN STRL DISP B (DISPOSABLE) ×2 IMPLANT
TRAY LAPAROSCOPIC (CUSTOM PROCEDURE TRAY) ×2 IMPLANT
TROCAR ADV FIXATION 11X100MM (TROCAR) IMPLANT
TROCAR ADV FIXATION 5X100MM (TROCAR) ×2 IMPLANT
TROCAR XCEL BLUNT TIP 100MML (ENDOMECHANICALS) ×2 IMPLANT
TUBING INSUF HEATED (TUBING) ×2 IMPLANT

## 2018-07-24 NOTE — Op Note (Signed)
07/24/2018  8:50 AM  PATIENT:  Lawrence Liu, 62 y.o., male, MRN: [id number]  PREOP DIAGNOSIS:  BILIARY DYSKINESIA  POSTOP DIAGNOSIS:   Chronic cholecystitis, cholelithiasis  PROCEDURE:   Procedure(s):  LAPAROSCOPIC CHOLECYSTECTOMY WITH INTRAOPERATIVE CHOLANGIOGRAM  SURGEON:   Alphonsa Overall, M.D.  ASSISTANT:   NOne  ANESTHESIA:   general  Anesthesiologist: Freddrick March, MD CRNA: Lind Covert, CRNA; Glory Buff, CRNA  General  ASA: 3  EBL:  Minimal  ml  BLOOD ADMINISTERED: none  DRAINS: none   LOCAL MEDICATIONS USED:   30 cc of 1/4% marcaine  SPECIMEN:   Gall bladder  COUNTS CORRECT:  YES  INDICATIONS FOR PROCEDURE:  Lawrence Liu is a 62 y.o. (DOB: [birth year]) white male whose primary care physician is Pickard, Cammie Mcgee, MD and comes for cholecystectomy.   The indications and risks of the gall bladder surgery were explained to the patient.  The risks include, but are not limited to, infection, bleeding, common bile duct injury and open surgery.  SURGERY:  The patient was taken to OR room #1 at [facility].  The abdomen was prepped with chloroprep.  The patient was given 2 gm Ancef at the beginning of the operation.   A time out was held and the surgical checklist run.   An infraumbilical incision was made into the abdominal cavity.  A 12 mm Hasson trocar was inserted into the abdominal cavity through the infraumbilical incision and secured with a 0 Vicryl suture.  Three additional trocars were inserted: a 5 mm trocar in the sub-xiphoid location, a 5 mm trocar in the right mid subcostal area, and a 5 mm trocar in the right lateral subcostal area.   The abdomen was explored and the liver, stomach, and bowel that could be seen were unremarkable.   The gall bladder was enveloped in fat that covered 70% of the gall bladder.  I had to dissect off the fat to get to the gall bladder.   I grasped the gall bladder and rotated it cephalad.   Disssection was carried down to the gall bladder/cystic duct junction and the cystic duct isolated.  A clip was placed on the gall bladder side of the cystic duct.   An intra-operative cholangiogram was shot.  In cutting the cystic duct, there were some soft black stones impacted in the neck of the gall bladder.   The intra-operative cholangiogram was shot using a cut off Taut catheter placed through a 14 gauge angiocath in the RUQ.  The Taut catheter was inserted in the cut cystic duct and secured with an endoclip.  A cholangiogram was shot with 8 cc of 1/2 strength Isoview.  Using fluoroscopy, the cholangiogram showed the flow of contrast into the common bile duct, up the hepatic radicals, and into the duodenum.  There was no mass or obstruction.  This was a normal intra-operative cholangiogram.   The Taut catheter was removed.  The cystic duct was tripley endoclipped and the cystic artery was identified and clipped.  The gall bladder was bluntly and sharpley dissected from the gall bladder bed.   After the gall bladder was removed from the liver, the gall bladder bed and Tri[place] of [place] were inspected.  There was no bleeding or bile leak.  The gall bladder was placed in a Ecco Sac bag and delivered through the umbilicus.  The abdomen was irrigated with 1,000 cc saline.   The trocars were then removed.  I infiltrated 30 cc of  1/4% Marcaine into the incisions.  The umbilical port closed with a 0 Vicryl suture and the skin closed with 4-0 Monocryl.  The skin was painted with DermaBond.  The patient's sponge and needle count were correct.  The patient was transported to the RR in good condition.  Alphonsa Overall, MD, [facility] Surgery Pager: [phone] Office phone:  [phone]

## 2018-07-24 NOTE — Discharge Instructions (Signed)
[facility] - DISCHARGE INSTRUCTIONS TO PATIENT  Activity:  Driving - May drive in 2 to 4 days, if doing well and off pain meds   Lifting - No lifting more than 15 pounds for 2 weeks, then no limit  Wound Care:   Leave incision dry for 2 days, then you may shower  Diet:  As tolerated.  Follow up appointment:  Call Dr. Pollie Friar office [facility]) at [phone] for an appointment in 2 to 3 weeks.  Medications and dosages:  Resume your home medications.             You have some Ultram at home.  Call Dr. Lucia Gaskins or his office  [phone]) if you have:  Temperature greater than 100.4,  Persistent nausea and vomiting,  Severe uncontrolled pain,  Redness, tenderness, or signs of infection (pain, swelling, redness, odor or green/yellow discharge around the site),  Difficulty breathing, headache or visual disturbances,  Any other questions or concerns you may have after discharge.  In an emergency, call 911 or go to an Emergency Department at a nearby hospital.

## 2018-07-24 NOTE — Transfer of Care (Signed)
Immediate Anesthesia Transfer of Care Note  Patient: Lawrence Liu  Procedure(s) Performed: LAPAROSCOPIC CHOLECYSTECTOMY WITH INTRAOPERATIVE CHOLANGIOGRAM (N/A )  Patient Location: PACU  Anesthesia Type:General  Level of Consciousness: sedated  Airway & Oxygen Therapy: Patient Spontanous Breathing and Patient connected to face mask oxygen  Post-op Assessment: Report given to RN and Post -op Vital signs reviewed and stable  Post vital signs: Reviewed and stable  Last Vitals:  Vitals Value Taken Time  BP 125/79 07/24/2018  8:55 AM  Temp    Pulse 64 07/24/2018  8:56 AM  Resp 16 07/24/2018  8:56 AM  SpO2 99 % 07/24/2018  8:56 AM  Vitals shown include unvalidated device data.  Last Pain:  Vitals:   07/24/18 0556  TempSrc:   PainSc: 0-No pain         Complications: Patient stable

## 2018-07-24 NOTE — Interval H&P Note (Signed)
History and Physical Interval Note:  07/24/2018 7:14 AM  Lawrence Liu  has presented today for surgery, with the diagnosis of BILIARY DYSKINESIA  The various methods of treatment have been discussed with the patient and family.   Wife at the bedside.  After consideration of risks, benefits and other options for treatment, the patient has consented to  Procedure(s): LAPAROSCOPIC CHOLECYSTECTOMY WITH INTRAOPERATIVE CHOLANGIOGRAM (N/A) as a surgical intervention .  The patient's history has been reviewed, patient examined, no change in status, stable for surgery.  I have reviewed the patient's chart and labs.  Questions were answered to the patient's satisfaction.     Shann Medal

## 2018-07-24 NOTE — Anesthesia Postprocedure Evaluation (Signed)
Anesthesia Post Note  Patient: Lawrence Liu  Procedure(s) Performed: LAPAROSCOPIC CHOLECYSTECTOMY WITH INTRAOPERATIVE CHOLANGIOGRAM (N/A )     Patient location during evaluation: PACU Anesthesia Type: General Level of consciousness: awake and alert Pain management: pain level controlled Vital Signs Assessment: post-procedure vital signs reviewed and stable Respiratory status: spontaneous breathing, nonlabored ventilation, respiratory function stable and patient connected to nasal cannula oxygen Cardiovascular status: blood pressure returned to baseline and stable Postop Assessment: no apparent nausea or vomiting Anesthetic complications: no    Last Vitals:  Vitals:   07/24/18 1051 07/24/18 1135  BP: 120/78 122/77  Pulse: 68 69  Resp: 16   Temp: 36.6 C 36.4 C  SpO2: 99% 97%    Last Pain:  Vitals:   07/24/18 1135  TempSrc: Oral  PainSc: 2                  Sheriden Archibeque L Celise Bazar

## 2018-07-24 NOTE — Anesthesia Procedure Notes (Signed)
Procedure Name: Intubation Date/Time: 07/24/2018 7:24 AM Performed by: Lind Covert, CRNA Pre-anesthesia Checklist: Patient identified, Emergency Drugs available, Suction available, Patient being monitored and Timeout performed Patient Re-evaluated:Patient Re-evaluated prior to induction Oxygen Delivery Method: Circle system utilized Preoxygenation: Pre-oxygenation with 100% oxygen Induction Type: IV induction Ventilation: Mask ventilation without difficulty and Oral airway inserted - appropriate to patient size Laryngoscope Size: Mac, 4 and Glidescope (Attempted with MAC 4 blade grade 3 view , switched to glidescope) Grade View: Grade I Tube type: Oral Tube size: 7.0 mm Number of attempts: 1 Airway Equipment and Method: Stylet and Video-laryngoscopy Placement Confirmation: ETT inserted through vocal cords under direct vision,  positive ETCO2 and breath sounds checked- equal and bilateral Secured at: 22 cm Tube secured with: Tape Dental Injury: Teeth and Oropharynx as per pre-operative assessment  Difficulty Due To: Difficult Airway- due to dentition, Difficult Airway- due to anterior larynx and Difficulty was anticipated

## 2018-07-25 ENCOUNTER — Other Ambulatory Visit: Payer: Self-pay

## 2018-07-25 NOTE — Patient Outreach (Signed)
[facility]) Care Management  07/25/2018  Lawrence Liu 1956-05-23 [id number]  Telephone call for transition of care call.  Member was hospitalized 07/24/18 for a Lararoscopic cholecystectomy with intraoperative cholangiogram  Subjective: States he is doing good since having his surgery yesterday.  States he is a little sore but not bad.  States his areas on his stomach look good.  States his appetite is good and has no nausea.  States his wife has not called to make his follow up appt yet but she will.    Objective: S/P Lararoscopic cholecystectomy with intraoperative cholangiogram  Assessment: Transition of care call completed Reviewed discharge instructions and s/s  to call MD Reinforced to call to schedule follow up appt with provider Reviewed [facility] benefits and how to contact the benefits line if needed   Plan: Plan to close case.  Member assessed with no further interventions needed Plan to send successful outreach letter.  Peter Garter RN, Jackquline Denmark, CDE Care Management Coordinator [facility] Care Management [phone]

## 2018-07-27 ENCOUNTER — Encounter (HOSPITAL_COMMUNITY): Payer: Self-pay | Admitting: Surgery

## 2018-07-31 ENCOUNTER — Inpatient Hospital Stay (HOSPITAL_COMMUNITY): Admission: RE | Admit: 2018-07-31 | Payer: 59 | Source: Ambulatory Visit

## 2018-09-21 IMAGING — CT CT ANGIO CHEST
2 of 4 series · 18 of 42 positions shown · IV contrast (isovue)
Comparison: 06/17/2015 and gated cardiac CTA on 05/10/2014

CLINICAL DATA: Followup of aneurysmal dilatation of the aortic root
at the level of the sinuses of Valsalva.

EXAM:
CT ANGIOGRAPHY CHEST WITH CONTRAST
TECHNIQUE: Multidetector CT imaging of the chest was performed using the
standard protocol during bolus administration of intravenous
contrast. Multiplanar CT image reconstructions and MIPs were
obtained to evaluate the vascular anatomy.
CONTRAST:  100 mL Isovue 370 IV

[Series 4: aorta 3.0 i31f 2 · axial · 0.75mm/px · z∈[-242,+49]mm · 15 of 105 slices shown]
[im 4/105  lung]
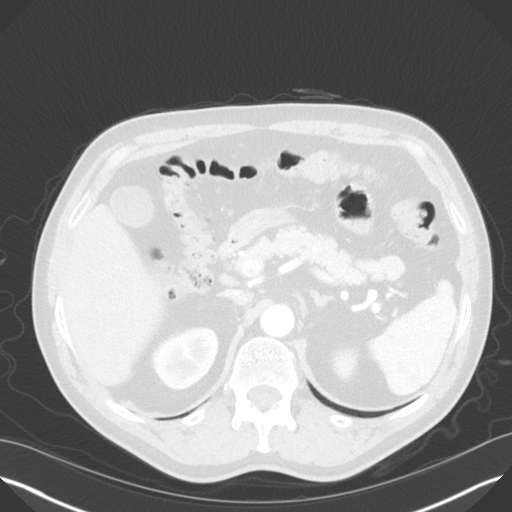
[im 12/105  soft-tissue]
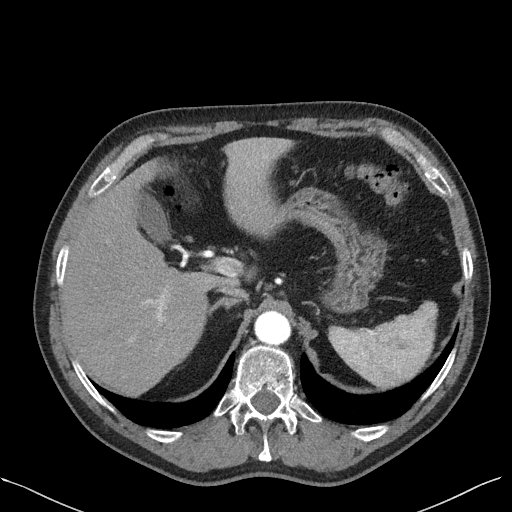
[im 20/105  lung]
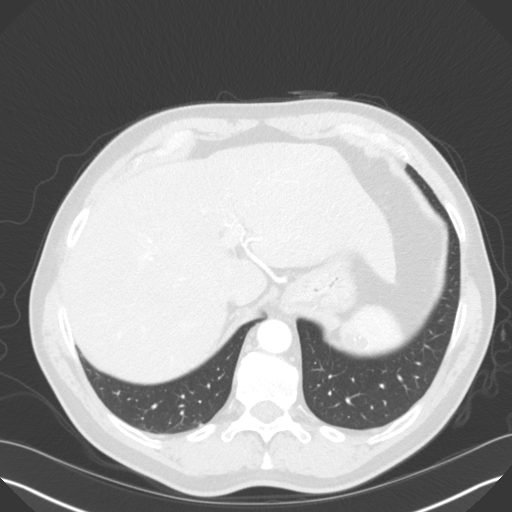
[im 27/105  soft-tissue]
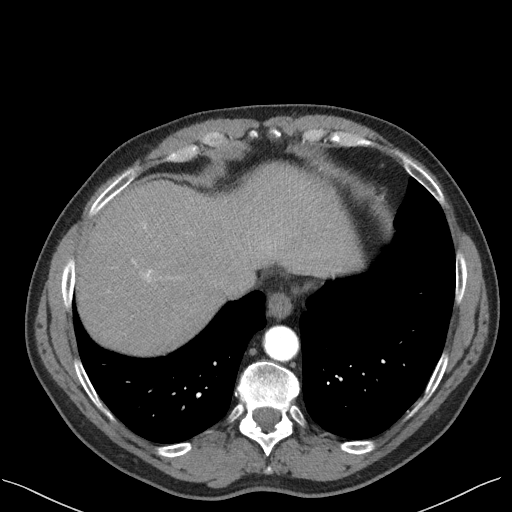
[im 31/105  lung]
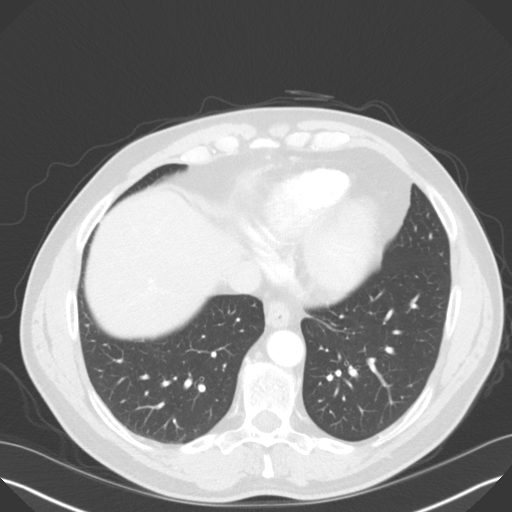
[im 39/105  soft-tissue]
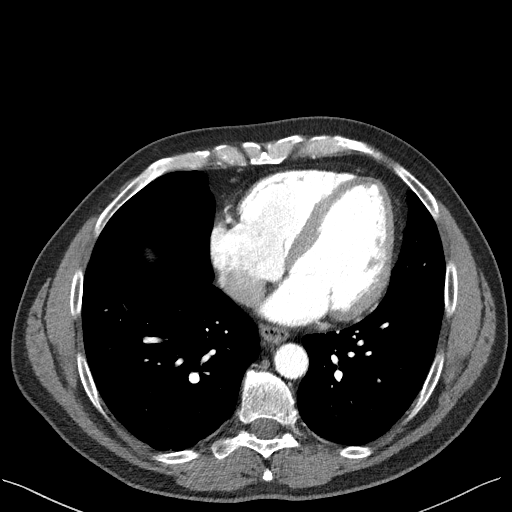
[im 47/105  lung]
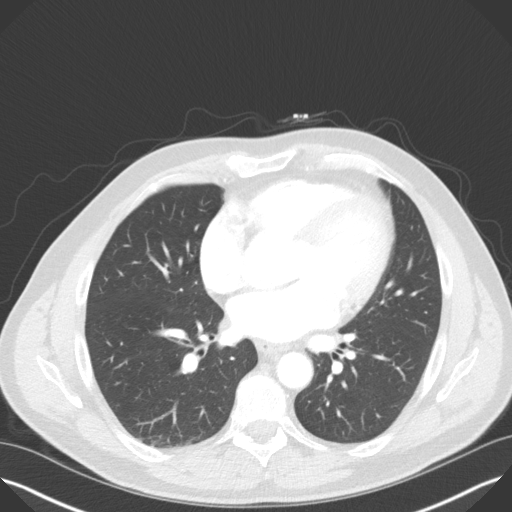
[im 54/105  soft-tissue]
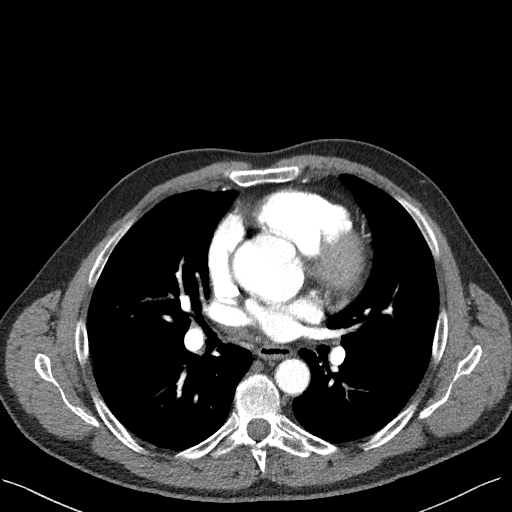
[im 58/105  lung]
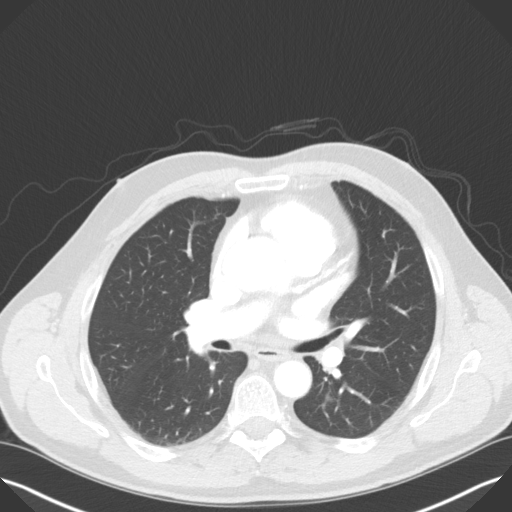
[im 66/105  soft-tissue]
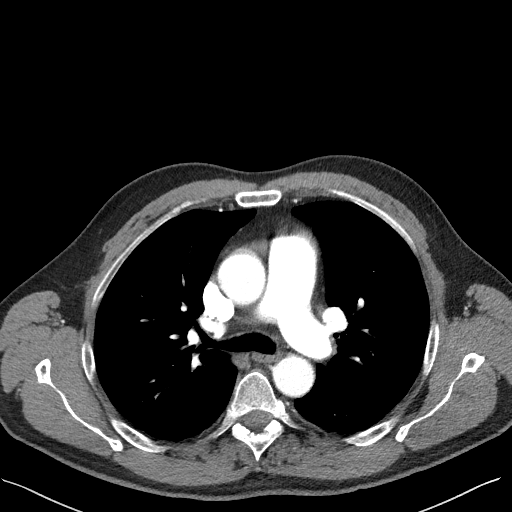
[im 74/105  lung]
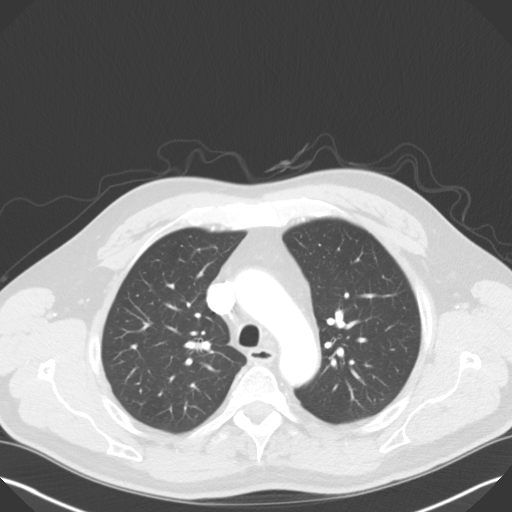
[im 78/105  soft-tissue]
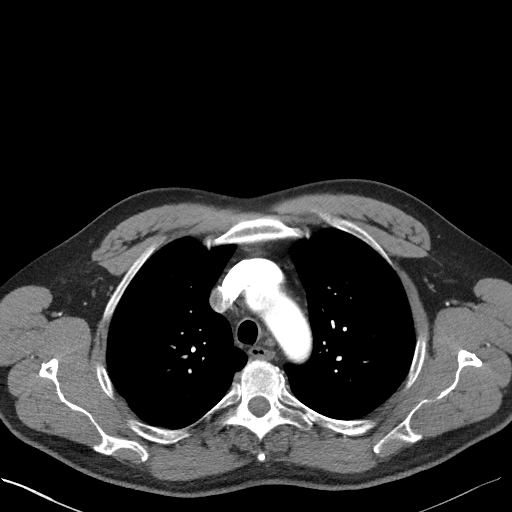
[im 85/105  lung]
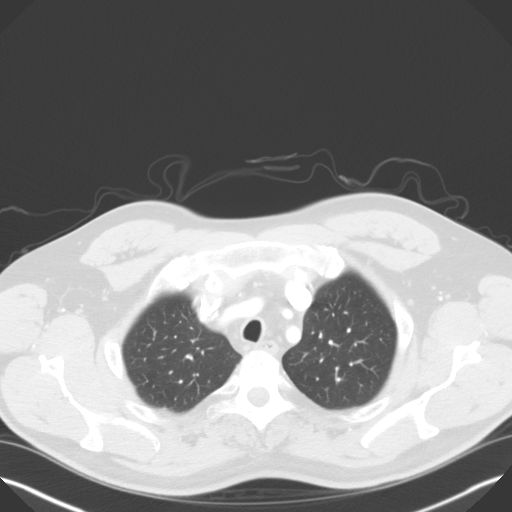
[im 93/105  soft-tissue]
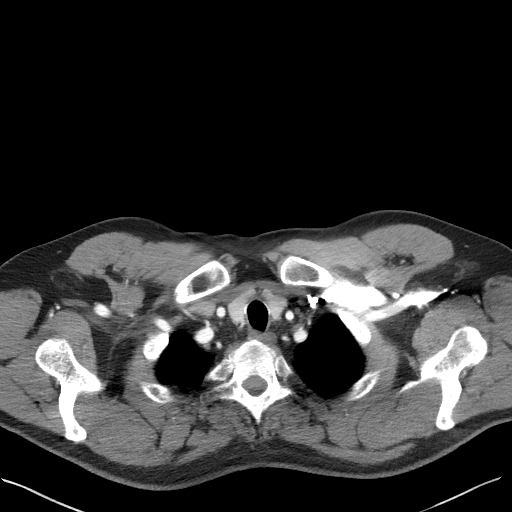
[im 101/105  lung]
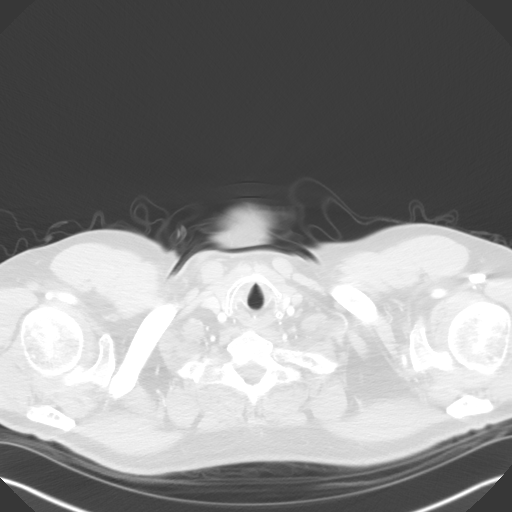

[Series 7: coronals · coronal · 0.60mm/px · 3 of 136 slices shown]
[im 46/136  soft-tissue]
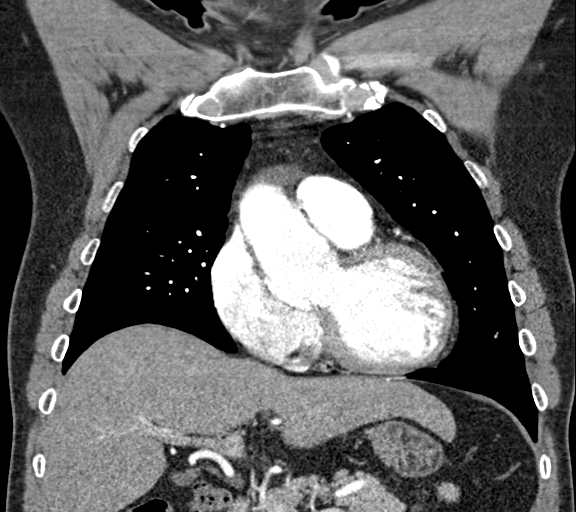
[im 61/136  soft-tissue]
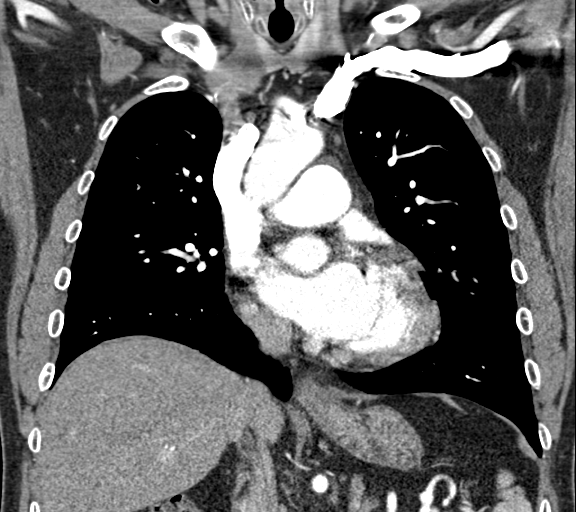
[im 76/136  soft-tissue]
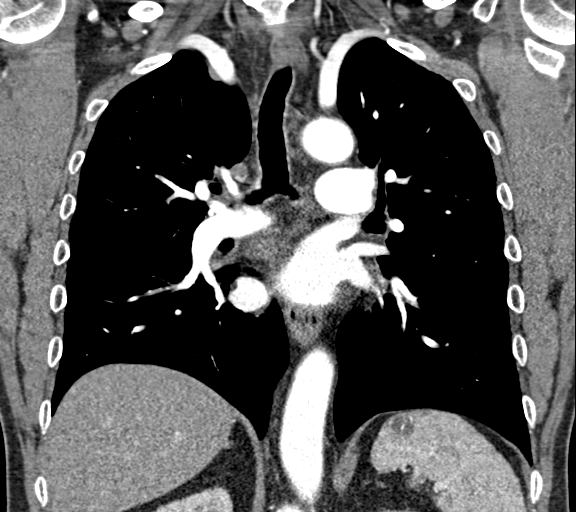

[18 of 42 positions shown; findings below may reference images not displayed]

FINDINGS: Cardiovascular: Stable aneurysmal dilatation of the aortic root at
the level of the sinuses of Valsalva. Depending on axis of
measurement, the maximal diameter on axial imaging may be as large
as 5.5 cm at the level of the origin of the right coronary artery.
This is comparable to measurement obtained on axial images from the
prior gated coronary CTA study in 5017. Just above this level,
comparable measurements are made compared to the previous studies of
5.0-5.2 cm. The rest of the thoracic aorta shows normal caliber and
no evidence of dissection. Proximal great vessels show normal
patency and bovine branching anatomy.

Stable and normal heart size. Stable mild calcified plaque in the
distribution of the LAD. No pericardial fluid.

Mediastinum/Nodes: No evidence of mediastinal, hilar or axillary
lymphadenopathy.

Lungs/Pleura: There is no evidence of pulmonary edema,
consolidation, pneumothorax, nodule or pleural fluid.

Upper Abdomen: Stable hepatic steatosis.

Musculoskeletal: Bony structures are unremarkable.

Review of the MIP images confirms the above findings.
IMPRESSION: Stable aneurysmal dilatation of the proximal thoracic aorta at the
level of the sinuses of Valsalva. Maximal diameter may be as large
as 5.5 cm at the level of the origin of the right coronary artery.
When making comparable measurements on the gated coronary CTA in
5017, identical diameter is obtained.

## 2018-10-03 ENCOUNTER — Encounter: Payer: Self-pay | Admitting: Family Medicine

## 2018-10-03 ENCOUNTER — Ambulatory Visit (INDEPENDENT_AMBULATORY_CARE_PROVIDER_SITE_OTHER): Payer: 59 | Admitting: Family Medicine

## 2018-10-03 VITALS — BP 110/72 | HR 102 | Temp 98.3°F | Resp 15 | Ht 73.0 in | Wt 219.0 lb

## 2018-10-03 DIAGNOSIS — J9801 Acute bronchospasm: Secondary | ICD-10-CM | POA: Diagnosis not present

## 2018-10-03 DIAGNOSIS — J0140 Acute pansinusitis, unspecified: Secondary | ICD-10-CM

## 2018-10-03 MED ORDER — PREDNISONE 20 MG PO TABS: 40.0000 mg | ORAL_TABLET | Freq: Every day | ORAL | 0 refills | Status: AC

## 2018-10-03 MED ORDER — AMOXICILLIN-POT CLAVULANATE 875-125 MG PO TABS: 1.0000 | ORAL_TABLET | Freq: Two times a day (BID) | ORAL | 0 refills | Status: AC

## 2018-10-03 MED ORDER — ALBUTEROL SULFATE 108 (90 BASE) MCG/ACT IN AEPB: 2.0000 | INHALATION_SPRAY | RESPIRATORY_TRACT | 0 refills | Status: DC | PRN

## 2018-10-03 MED ORDER — GUAIFENESIN ER 600 MG PO TB12: 600.0000 mg | ORAL_TABLET | Freq: Two times a day (BID) | ORAL | 0 refills | Status: AC

## 2018-10-03 MED FILL — PROAIR RESPICLICK INHAL PWD: 108 (90 BAS | 17 days supply | Qty: 1 | Fill #0

## 2018-10-03 MED FILL — predniSONE 20 MG TABS: 20 | 5 days supply | Qty: 10 | Fill #0

## 2018-10-03 MED FILL — AMOX-CLAV 875-125 MG TABLET: 875-125 | 7 days supply | Qty: 14 | Fill #0

## 2018-10-03 NOTE — Progress Notes (Signed)
Patient ID: Lawrence Liu, male    DOB: 1956-01-22, 63 y.o.   MRN: [id number]  PCP: [name], MD  Chief Complaint  Patient presents with  . Cough    Patient in with c/o productive cough, chest and nasal congestion. Onset 4 weeks ago.    Subjective:   Lawrence Liu is a 63 y.o. male, presents to clinic with CC of sinus illness for a month and recent coughing/wheeze/bronchospasms with SOB for the past week. Cough  This is a new problem. The current episode started in the past 7 days. The problem has been gradually worsening. The problem occurs hourly. The cough is productive of purulent sputum (white to yellow, thick, scant). Associated symptoms include headaches, nasal congestion, postnasal drip, rhinorrhea, a sore throat and shortness of breath. Pertinent negatives include no chest pain, chills, fever, heartburn, hemoptysis, myalgias, rash, sweats or weight loss. The symptoms are aggravated by exercise and lying down. He has tried OTC cough suppressant and body position changes (robitussin, sudafed for nasal congestion) for the symptoms. The treatment provided mild relief. His past medical history is significant for bronchitis and environmental allergies. There is no history of asthma, bronchiectasis, COPD, emphysema or pneumonia.  Sinusitis  This is a new problem. The current episode started 1 to 4 weeks ago (4 weeks). The problem has been gradually improving since onset. There has been no fever. The pain is moderate (was moderate, improving, currently 0/10). Associated symptoms include congestion, coughing, headaches, shortness of breath, sinus pressure, sneezing and a sore throat. Pertinent negatives include no chills, diaphoresis, neck pain or swollen glands. Past treatments include oral decongestants, sitting up and saline sprays. The treatment provided mild relief.      Patient Active Problem List   Diagnosis Date Noted  . Biliary dyskinesia 06/29/2018  . Postoperative  anemia 09/10/2017  . Postoperative atrial fibrillation ([place]) 09/08/2017  . S/P aortic aneurysm repair 08/15/2017  . Essential hypertension 10/01/2016  . Aortic root dilatation ([place]) 11/06/2013  . 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 PVC's (premature ventricular contractions) 10/02/2013  . History of DVT (deep vein thrombosis) 10/02/2013  . GERD (gastroesophageal reflux disease)   . Arthritis   . Deep vein thrombosis ([place])      Prior to Admission medications   Medication Sig Start Date End Date Taking? Authorizing Provider  amLODipine-benazepril (LOTREL) 5-20 MG capsule Take 1 capsule by mouth daily. 11/09/17  Yes [name], MD  Ascorbic Acid (VITAMIN C) 1000 MG tablet Take 1,000 mg by mouth daily.   Yes [provider]  aspirin 81 MG tablet Take 4 tablets (325 mg total) by mouth daily. 07/10/18  Yes Lendon Colonel, NP  atorvastatin (LIPITOR) 20 MG tablet Take 1 tablet (20 mg total) by mouth daily. 07/10/18  Yes Lendon Colonel, NP  Cholecalciferol (VITAMIN D3) 5000 units CAPS Take 5,000 Units by mouth daily.   Yes [provider]  Coenzyme Q10 (CO Q-10) 300 MG CAPS Take 300 mg by mouth daily.   Yes [provider]  Cyanocobalamin (VITAMIN B-12) 5000 MCG TBDP Take 5,000 mcg by mouth daily.    Yes [provider]  famotidine (PEPCID) 20 MG tablet Take 20 mg by mouth daily.   Yes [provider]  fexofenadine (ALLEGRA) 180 MG tablet Take 180 mg by mouth daily.   Yes [provider]  metoprolol succinate (TOPROL-XL) 100 MG 24 hr tablet Take 1 tablet (100 mg total) by mouth daily. 07/10/18  Yes  Lendon Colonel, NP  Probiotic Product (PROBIOTIC PO) Take 1 tablet by mouth daily. PROBIOTICS 10   Yes [provider]  vitamin E 400 UNIT capsule Take 800 Units by mouth daily.    Yes [provider]     Allergies  Allergen Reactions  . Latex Anaphylaxis  . Penicillins Hives    Whencombined with sulfa, CAN  take penicillin by itself Has patient had a PCN reaction causing immediate rash, facial/tongue/throat swelling, SOB or lightheadedness with hypotension: Yes Has patient had a PCN reaction causing severe rash involving mucus membranes or skin necrosis: No Has patient had a PCN reaction that required hospitalization: No Has patient had a PCN reaction occurring within the last 10 years: No If all of the above answers are "NO", then may proceed with Cephalosporin use.  . Sulfa Antibiotics Hives    Only when mixed sulfa and pencillin together     Family History  Problem Relation Age of Onset  . Heart disease Father   . Diabetes Father   . Diabetes Mother      Social History   Socioeconomic History  . Marital status: Married    Spouse name: Not on file  . Number of children: Not on file  . Years of education: Not on file  . Highest education level: Not on file  Occupational History  . Not on file  Social Needs  . Financial resource strain: Not on file  . Food insecurity:    Worry: Not on file    Inability: Not on file  . Transportation needs:    Medical: Not on file    Non-medical: Not on file  Tobacco Use  . Smoking status: Never Smoker  . Smokeless tobacco: Never Used  Substance and Sexual Activity  . Alcohol use: Yes    Comment: 10/02/2013 "hard apple cider maybe once/month"  . Drug use: No  . Sexual activity: Yes  Lifestyle  . Physical activity:    Days per week: Not on file    Minutes per session: Not on file  . Stress: Not on file  Relationships  . Social connections:    Talks on phone: Not on file    Gets together: Not on file    Attends religious service: Not on file    Active member of club or organization: Not on file    Attends meetings of clubs or organizations: Not on file    Relationship status: Not on file  . Intimate partner violence:    Fear of current or ex partner: Not on file    Emotionally abused: Not on file    Physically abused: Not on file      Forced sexual activity: Not on file  Other Topics Concern  . Not on file  Social History Narrative  . Not on file     Review of Systems  Constitutional: Negative.  Negative for chills, diaphoresis, fever and weight loss.  HENT: Positive for congestion, postnasal drip, rhinorrhea, sinus pressure, sneezing and sore throat.   Eyes: Negative.   Respiratory: Positive for cough and shortness of breath. Negative for hemoptysis.   Cardiovascular: Negative.  Negative for chest pain.  Gastrointestinal: Negative.  Negative for heartburn.  Endocrine: Negative.   Genitourinary: Negative.   Musculoskeletal: Negative.  Negative for myalgias and neck pain.  Skin: Negative.  Negative for rash.  Allergic/Immunologic: Positive for environmental allergies.  Neurological: Positive for headaches.  Hematological: Negative.   Psychiatric/Behavioral: Negative.   All other systems  reviewed and are negative.      Objective:    Vitals:   10/03/18 1411  BP: 110/72  Pulse: (!) 102  Resp: 15  Temp: 98.3 F (36.8 C)  TempSrc: Oral  SpO2: 95%  Weight: 219 lb (99.3 kg)  Height: 6\' 1"  (1.854 m)      Physical Exam Vitals signs and nursing note reviewed.  Constitutional:      General: He is not in acute distress.    Appearance: Normal appearance. He is well-developed. He is not ill-appearing, toxic-appearing or diaphoretic.  HENT:     Head: Normocephalic and atraumatic.     Jaw: No trismus.     Right Ear: Tympanic membrane, ear canal and external ear normal.     Left Ear: Tympanic membrane, ear canal and external ear normal.     Nose: Mucosal edema (erythematous), congestion and rhinorrhea (moderate) present. Rhinorrhea is clear.     Right Sinus: No maxillary sinus tenderness or frontal sinus tenderness.     Left Sinus: No maxillary sinus tenderness or frontal sinus tenderness.     Mouth/Throat:     Mouth: Mucous membranes are moist. Mucous membranes are not pale, not dry and not cyanotic.      Pharynx: Uvula midline. Posterior oropharyngeal erythema present. No oropharyngeal exudate or uvula swelling.     Tonsils: No tonsillar exudate or tonsillar abscesses.  Eyes:     General: Lids are normal. No scleral icterus.       Right eye: No discharge.        Left eye: No discharge.     Conjunctiva/sclera: Conjunctivae normal.     Pupils: Pupils are equal, round, and reactive to light.  Neck:     Musculoskeletal: Normal range of motion and neck supple.     Trachea: Trachea and phonation normal. No tracheal deviation.  Cardiovascular:     Rate and Rhythm: Regular rhythm. Tachycardia present.     Pulses: Normal pulses.          Radial pulses are 2+ on the right side and 2+ on the left side.     Heart sounds: Normal heart sounds. No murmur. No friction rub. No gallop.   Pulmonary:     Effort: Pulmonary effort is normal. No tachypnea, accessory muscle usage or respiratory distress.     Breath sounds: Normal breath sounds. No stridor. No decreased breath sounds, wheezing, rhonchi or rales.  Abdominal:     General: Bowel sounds are normal. There is no distension.     Palpations: Abdomen is soft.     Tenderness: There is no abdominal tenderness.  Musculoskeletal: Normal range of motion.     Right lower leg: No edema.     Left lower leg: No edema.  Lymphadenopathy:     Cervical: No cervical adenopathy.  Skin:    General: Skin is warm and dry.     Capillary Refill: Capillary refill takes less than 2 seconds.     Coloration: Skin is not pale.     Findings: No rash.     Nails: There is no clubbing.   Neurological:     Mental Status: He is alert and oriented to person, place, and time.     Motor: No abnormal muscle tone.     Coordination: Coordination normal.     Gait: Gait normal.  Psychiatric:        Speech: Speech normal.        Behavior: Behavior normal. Behavior is cooperative.  Assessment & Plan:      ICD-10-CM   1. Acute bronchospasm J98.01     coughing fits with SOB with exertion and laying down, tx with bronchodilater, steroid burst, mucinex  2. Acute non-recurrent pansinusitis J01.40    >4 weeks sx, exam + for nasal mucosal edema, erythema moderate discharge, + indication for abx tx of ABS    F/up if not improving in 3-7 days, follow up sooner with any worsening. Continue other supportive tx, mucinex, OTC meds - robitussin  Delsa Grana, PA-C 10/03/18 2:24 PM

## 2018-10-06 ENCOUNTER — Encounter: Payer: Self-pay | Admitting: Family Medicine

## 2018-10-06 MED FILL — AMLODIPINE-BENAZEPRIL 5-20: 5-20 | 90 days supply | Qty: 90 | Fill #3

## 2018-10-31 MED FILL — ATORVASTATIN 20 MG TABLET: 20 | 90 days supply | Qty: 90 | Fill #0

## 2018-10-31 MED FILL — METOPROLOL SUCCINATE ER 100: 100 | 90 days supply | Qty: 90 | Fill #0

## 2019-01-16 ENCOUNTER — Other Ambulatory Visit: Payer: Self-pay | Admitting: Surgery

## 2019-01-16 MED FILL — ATORVASTATIN 20 MG TABLET: 20 | 90 days supply | Qty: 90 | Fill #0

## 2019-01-16 MED FILL — METOPROLOL SUCCINATE ER 100: 100 | 90 days supply | Qty: 90 | Fill #0

## 2019-01-18 ENCOUNTER — Other Ambulatory Visit: Payer: Self-pay

## 2019-01-18 ENCOUNTER — Other Ambulatory Visit: Payer: Self-pay | Admitting: Surgery

## 2019-01-18 MED ORDER — AMLODIPINE BESY-BENAZEPRIL HCL 5-20 MG PO CAPS: 1.0000 | ORAL_CAPSULE | Freq: Every day | ORAL | 6 refills | Status: DC

## 2019-01-18 MED FILL — AMLODIPINE-BENAZEPRIL 5-20: 5-20 | 30 days supply | Qty: 30 | Fill #0

## 2019-02-28 ENCOUNTER — Other Ambulatory Visit: Payer: Self-pay

## 2019-03-01 ENCOUNTER — Encounter: Payer: Self-pay | Admitting: Family Medicine

## 2019-03-01 ENCOUNTER — Ambulatory Visit (INDEPENDENT_AMBULATORY_CARE_PROVIDER_SITE_OTHER): Payer: 59 | Admitting: Family Medicine

## 2019-03-01 VITALS — BP 130/78 | HR 72 | Temp 98.0°F | Resp 14 | Ht 72.0 in | Wt 220.0 lb

## 2019-03-01 DIAGNOSIS — I10 Essential (primary) hypertension: Secondary | ICD-10-CM | POA: Diagnosis not present

## 2019-03-01 DIAGNOSIS — E785 Hyperlipidemia, unspecified: Secondary | ICD-10-CM

## 2019-03-01 DIAGNOSIS — Z0000 Encounter for general adult medical examination without abnormal findings: Secondary | ICD-10-CM | POA: Diagnosis not present

## 2019-03-01 DIAGNOSIS — Z9889 Other specified postprocedural states: Secondary | ICD-10-CM | POA: Diagnosis not present

## 2019-03-01 DIAGNOSIS — Z8679 Personal history of other diseases of the circulatory system: Secondary | ICD-10-CM | POA: Diagnosis not present

## 2019-03-01 DIAGNOSIS — Z23 Encounter for immunization: Secondary | ICD-10-CM | POA: Diagnosis not present

## 2019-03-01 DIAGNOSIS — I77810 Thoracic aortic ectasia: Secondary | ICD-10-CM

## 2019-03-01 DIAGNOSIS — Z0001 Encounter for general adult medical examination with abnormal findings: Secondary | ICD-10-CM

## 2019-03-01 DIAGNOSIS — Z125 Encounter for screening for malignant neoplasm of prostate: Secondary | ICD-10-CM | POA: Diagnosis not present

## 2019-03-01 MED ORDER — METOPROLOL SUCCINATE ER 100 MG PO TB24: 100.0000 mg | ORAL_TABLET | Freq: Every day | ORAL | 3 refills | Status: DC

## 2019-03-01 MED ORDER — AMLODIPINE BESY-BENAZEPRIL HCL 5-20 MG PO CAPS: 1.0000 | ORAL_CAPSULE | Freq: Every day | ORAL | 3 refills | Status: DC

## 2019-03-01 MED FILL — AMLODIPINE-BENAZEPRIL 5-20: 5-20 | 90 days supply | Qty: 90 | Fill #0

## 2019-03-01 NOTE — Progress Notes (Signed)
Subjective:    Patient ID: Lawrence Liu, male    DOB: [birth year], 63 y.o.   MRN: [id number]  HPI Patient is a very pleasant 63 year old Caucasian male here today for complete physical exam.  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  He denies any symptoms of infection.  Past medical history is significant for aortic aneurysm repair in 2019.  He also has a history of nonobstructive coronary artery disease as listed below.  He denies any angina or chest pain or shortness of breath or dyspnea on exertion.  However he is on a low intensity statin.  We discussed today and I would like to try to switch the patient to a high intensity statin particular if his LDL cholesterol is greater than 70.  Otherwise his blood pressure is extremely well controlled.  His colonoscopy was performed in 2018 and was completely normal.  He is not due for repeat colonoscopy until 2028.  He has had Pneumovax 23.  He is due for a booster on his tetanus shot.  He is also due for the shingles vaccine and also for prostate cancer screening. Past Medical History:  Diagnosis Date  . Alpha galactosidase deficiency    multiple tick bites  . Deep vein thrombosis ([place]) 2004   Left deep vein thrombosis of left calf  . GERD (gastroesophageal reflux disease)   . History of blood transfusion    as a baby for jaundice  . Hypertension   . 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 No lesions greater than 30% noted. - confirmed by Cor CTA  . Pneumonia 1980's   walking pneumonia  . Status post ascending aortic aneurysm repair 08/2016   Dr. Cyndia Bent  . Thoracic ascending aortic aneurysm ([place]) 11/06/2013   CTA of chest November 2017 showed notable increase in size --> s/p Open (Valve Sparing) Repair Jan 2019)   Past Surgical History:  Procedure Laterality Date  . ASCENDING AORTIC ROOT REPLACEMENT N/A 08/15/2017   Procedure: ASCENDING AORTIC ROOT REPLACEMENT, (VALVE  SPARING ROOT REPLACEMENT);  Surgeon: [name], MD;  Location: [place];  Service: Open Heart Surgery;  Laterality: N/A;  . CHOLECYSTECTOMY N/A 07/24/2018   Procedure: LAPAROSCOPIC CHOLECYSTECTOMY WITH INTRAOPERATIVE CHOLANGIOGRAM;  Surgeon: Alphonsa Overall, MD;  Location[facility];  Service: General;  Laterality: N/A;  . COLONOSCOPY WITH PROPOFOL N/A 11/23/2016   Procedure: COLONOSCOPY WITH PROPOFOL;  Surgeon: Juanita Craver, MD;  [facility];  Service: Endoscopy;  Laterality: N/A;  . colonscopy  5 1/2 yrs ago   polyps removed  . EYE SURGERY Bilateral   . LEFT HEART CATHETERIZATION WITH CORONARY ANGIOGRAM N/A 10/03/2013   Procedure: LEFT HEART CATHETERIZATION WITH CORONARY ANGIOGRAM;  Surgeon: Jolaine Artist, MD;  Location: [facility] CATH LAB;  Service: Cardiovascular:  Nonobstructive disease - LAD diffuse 20-30%. Small distal vessel. Circumflex ostial 30-40%. Large OM 1 with 30% mid. Small OM 2 and several small PL branches. Large dominant RCA with 30% mid and diffuse 20-30% distal.  . SHOULDER ARTHROSCOPY  07/15/2011   Procedure: ARTHROSCOPY SHOULDER;  Surgeon: Metta Clines Supple;  Location: [place];  Service: Orthopedics;  Laterality: Right;  RIGHT ARTHROSCOPY SUBACROMIAL DECOMPRESSION AND DISTAL CLAVICLE RESECTION  . TEE WITHOUT CARDIOVERSION N/A 08/15/2017   Procedure: TRANSESOPHAGEAL ECHOCARDIOGRAM (TEE);  Surgeon: [name], MD;  Location: [place];  Service: Open Heart Surgery;  Laterality: N/A;  . TONSILLECTOMY  ~ 1970   adenoids also  . TRANSTHORACIC ECHOCARDIOGRAM  March 2015   Upper limit size LV. Mild LVH. EF 60%. Normal wall motion. Ascending aorta measures 48 mm at root tapering to 40 mm at proximal ascending. Mild RV dilation.   Current Outpatient Medications on File Prior to Visit  Medication Sig Dispense Refill  . Albuterol Sulfate (PROAIR RESPICLICK) 951 (90 Base) MCG/ACT AEPB Inhale 2 puffs into the lungs every 4 (four) hours as needed. 1 each 0  . amLODipine-benazepril  (LOTREL) 5-20 MG capsule Take 1 capsule by mouth daily. 30 capsule 6  . Ascorbic Acid (VITAMIN C) 1000 MG tablet Take 1,000 mg by mouth daily.    Marland Kitchen aspirin 81 MG tablet Take 4 tablets (325 mg total) by mouth daily.    Marland Kitchen atorvastatin (LIPITOR) 20 MG tablet Take 1 tablet (20 mg total) by mouth daily. 90 tablet 3  . Cholecalciferol (VITAMIN D3) 5000 units CAPS Take 5,000 Units by mouth daily.    . Coenzyme Q10 (CO Q-10) 300 MG CAPS Take 300 mg by mouth daily.    . Cyanocobalamin (VITAMIN B-12) 5000 MCG TBDP Take 5,000 mcg by mouth daily.     . famotidine (PEPCID) 20 MG tablet Take 20 mg by mouth daily.    . fexofenadine (ALLEGRA) 180 MG tablet Take 180 mg by mouth daily.    . metoprolol succinate (TOPROL-XL) 100 MG 24 hr tablet Take 1 tablet (100 mg total) by mouth daily. 90 tablet 3  . Probiotic Product (PROBIOTIC PO) Take 1 tablet by mouth daily. PROBIOTICS 10    . vitamin E 400 UNIT capsule Take 800 Units by mouth daily.      No current facility-administered medications on file prior to visit.    Allergies  Allergen Reactions  . Latex Anaphylaxis  . Penicillins Hives    Whencombined with sulfa, CAN take penicillin by itself Has patient had a PCN reaction causing immediate rash, facial/tongue/throat swelling, SOB or lightheadedness with hypotension: Yes Has patient had a PCN reaction causing severe rash involving mucus membranes or skin necrosis: No Has patient had a PCN reaction that required hospitalization: No Has patient had a PCN reaction occurring within the last 10 years: No If all of the above answers are "NO", then may proceed with Cephalosporin use.  . Sulfa Antibiotics Hives    Only when mixed sulfa and pencillin together   Social History   Socioeconomic History  . Marital status: Married    Spouse name: Not on file  . Number of children: Not on file  . Years of education: Not on file  . Highest education level: Not on file  Occupational History  . Not on file  Social  Needs  . Financial resource strain: Not on file  . Food insecurity    Worry: Not on file    Inability: Not on file  . Transportation needs    Medical: Not on file    Non-medical: Not on file  Tobacco Use  . Smoking status: Never Smoker  . Smokeless tobacco: Never Used  Substance and Sexual Activity  . Alcohol use: Yes    Comment: 10/02/2013 "hard apple cider maybe once/month"  . Drug use: No  . Sexual activity: Yes  Lifestyle  . Physical activity    Days per week: Not on file    Minutes per session: Not on file  . Stress: Not on file  Relationships  . Social Herbalist on phone: Not on file    Gets together: Not on file  Attends religious service: Not on file    Active member of club or organization: Not on file    Attends meetings of clubs or organizations: Not on file    Relationship status: Not on file  . Intimate partner violence    Fear of current or ex partner: Not on file    Emotionally abused: Not on file    Physically abused: Not on file    Forced sexual activity: Not on file  Other Topics Concern  . Not on file  Social History Narrative  . Not on file   Family History  Problem Relation Age of Onset  . Heart disease Father   . Diabetes Father   . Diabetes Mother       Review of Systems  All other systems reviewed and are negative.      Objective:   Physical Exam Vitals signs reviewed.  Constitutional:      General: He is not in acute distress.    Appearance: Normal appearance. He is normal weight. He is not ill-appearing, toxic-appearing or diaphoretic.  HENT:     Head: Normocephalic and atraumatic.     Right Ear: Tympanic membrane, ear canal and external ear normal. There is no impacted cerumen.     Left Ear: Tympanic membrane, ear canal and external ear normal. There is no impacted cerumen.     Nose: Nose normal. No congestion or rhinorrhea.     Mouth/Throat:     Mouth: Mucous membranes are moist.     Pharynx: Oropharynx is clear.  No oropharyngeal exudate or posterior oropharyngeal erythema.  Eyes:     General: No scleral icterus.       Right eye: No discharge.        Left eye: No discharge.     Extraocular Movements: Extraocular movements intact.     Conjunctiva/sclera: Conjunctivae normal.     Pupils: Pupils are equal, round, and reactive to light.  Neck:     Musculoskeletal: Normal range of motion and neck supple. No neck rigidity or muscular tenderness.     Vascular: No carotid bruit.  Cardiovascular:     Rate and Rhythm: Normal rate and regular rhythm.     Pulses: Normal pulses.     Heart sounds: Normal heart sounds. No murmur. No friction rub. No gallop.   Pulmonary:     Effort: Pulmonary effort is normal. No respiratory distress.     Breath sounds: Normal breath sounds. No stridor. No wheezing, rhonchi or rales.  Chest:     Chest wall: No tenderness.  Abdominal:     General: Bowel sounds are normal. There is no distension.     Palpations: Abdomen is soft. There is no mass.     Tenderness: There is no abdominal tenderness. There is no right CVA tenderness, left CVA tenderness, guarding or rebound.     Hernia: No hernia is present.  Genitourinary:    Prostate: Normal.     Rectum: Normal.  Musculoskeletal:     Right lower leg: No edema.     Left lower leg: No edema.  Lymphadenopathy:     Cervical: No cervical adenopathy.  Skin:    General: Skin is warm.     Coloration: Skin is not jaundiced or pale.     Findings: No bruising, erythema, lesion or rash.  Neurological:     General: No focal deficit present.     Mental Status: He is alert and oriented to person, place, and time. Mental status  is at baseline.     Cranial Nerves: No cranial nerve deficit.     Sensory: No sensory deficit.     Motor: No weakness.     Coordination: Coordination normal.     Gait: Gait normal.     Deep Tendon Reflexes: Reflexes normal.  Psychiatric:        Mood and Affect: Mood normal.        Behavior: Behavior normal.         Thought Content: Thought content normal.        Judgment: Judgment normal.           Assessment & Plan:  The primary encounter diagnosis was General medical exam. Diagnoses of Atherogenic dyslipidemia, Essential hypertension, Aortic root dilatation (HCC), S/P aortic aneurysm repair, and Prostate cancer screening were also pertinent to this visit. Physical exam today is normal.  Prostate exam is normal.  I will check a PSA to screen for prostate cancer.  Colonoscopy is up-to-date.  Patient received a tetanus booster today.  Pneumovax 23 is up-to-date.  I recommended the shingles vaccine once the current coronavirus pandemic has calmed down and the pharmacy starts to readminister the vaccine.  I will check a CBC, CMP, fasting lipid panel.  Ideally I like his LDL cholesterol to be well below 70.  If not, I would increase the intensity of his statin to Lipitor 40 mg a day.  Otherwise he is doing extremely well with no other medical concerns.

## 2019-03-01 NOTE — Addendum Note (Signed)
Addended by: Shary Decamp B on: 03/01/2019 10:56 AM   Modules accepted: Orders

## 2019-03-01 NOTE — Addendum Note (Signed)
Addended by: Shary Decamp B on: 03/01/2019 11:54 AM   Modules accepted: Orders

## 2019-03-02 LAB — COMPLETE METABOLIC PANEL WITH GFR
AG Ratio: 1.8 (calc) (ref 1.0–2.5)
ALT: 17 U/L (ref 9–46)
AST: 16 U/L (ref 10–35)
Albumin: 4.2 g/dL (ref 3.6–5.1)
Alkaline phosphatase (APISO): 61 U/L (ref 35–144)
BUN: 18 mg/dL (ref 7–25)
CO2: 25 mmol/L (ref 20–32)
Calcium: 9.1 mg/dL (ref 8.6–10.3)
Chloride: 109 mmol/L (ref 98–110)
Creat: 0.93 mg/dL (ref 0.70–1.25)
GFR, Est African American: 103 mL/min/{1.73_m2} (ref >=60)
GFR, Est Non African American: 89 mL/min/{1.73_m2} (ref >=60)
Globulin: 2.4 g/dL (calc) (ref 1.9–3.7)
Glucose, Bld: 98 mg/dL (ref 65–99)
Potassium: 4.3 mmol/L (ref 3.5–5.3)
Sodium: 141 mmol/L (ref 135–146)
Total Bilirubin: 0.6 mg/dL (ref 0.2–1.2)
Total Protein: 6.6 g/dL (ref 6.1–8.1)

## 2019-03-02 LAB — CBC WITH DIFFERENTIAL/PLATELET
Absolute Monocytes: 435 cells/uL (ref 200–950)
Basophils Absolute: 32 cells/uL (ref 0–200)
Basophils Relative: 0.5 %
Eosinophils Absolute: 170 cells/uL (ref 15–500)
Eosinophils Relative: 2.7 %
HCT: 42.7 % (ref 38.5–50.0)
Hemoglobin: 14 g/dL (ref 13.2–17.1)
Lymphs Abs: 2136 cells/uL (ref 850–3900)
MCH: 26.5 pg — ABNORMAL LOW (ref 27.0–33.0)
MCHC: 32.8 g/dL (ref 32.0–36.0)
MCV: 80.7 fL (ref 80.0–100.0)
MPV: 9.6 fL (ref 7.5–12.5)
Monocytes Relative: 6.9 %
Neutro Abs: 3528 cells/uL (ref 1500–7800)
Neutrophils Relative %: 56 %
Platelets: 207 10*3/uL (ref 140–400)
RBC: 5.29 10*6/uL (ref 4.20–5.80)
RDW: 14.2 % (ref 11.0–15.0)
Total Lymphocyte: 33.9 %
WBC: 6.3 10*3/uL (ref 3.8–10.8)

## 2019-03-02 LAB — LIPID PANEL
Cholesterol: 111 mg/dL (ref <200)
HDL: 43 mg/dL (ref >=40)
LDL Cholesterol (Calc): 50 mg/dL (calc)
Non-HDL Cholesterol (Calc): 68 mg/dL (calc) (ref <130)
Total CHOL/HDL Ratio: 2.6 (calc) (ref <5.0)
Triglycerides: 94 mg/dL (ref <150)

## 2019-03-02 LAB — PSA: PSA: 1.6 ng/mL (ref <=4.0)

## 2019-05-08 ENCOUNTER — Telehealth (INDEPENDENT_AMBULATORY_CARE_PROVIDER_SITE_OTHER): Payer: 59 | Admitting: Cardiology

## 2019-05-08 ENCOUNTER — Encounter: Payer: Self-pay | Admitting: Cardiology

## 2019-05-08 VITALS — BP 136/70

## 2019-05-08 DIAGNOSIS — Z8679 Personal history of other diseases of the circulatory system: Secondary | ICD-10-CM

## 2019-05-08 DIAGNOSIS — I493 Ventricular premature depolarization: Secondary | ICD-10-CM

## 2019-05-08 DIAGNOSIS — E785 Hyperlipidemia, unspecified: Secondary | ICD-10-CM

## 2019-05-08 DIAGNOSIS — I77810 Thoracic aortic ectasia: Secondary | ICD-10-CM

## 2019-05-08 DIAGNOSIS — I4891 Unspecified atrial fibrillation: Secondary | ICD-10-CM

## 2019-05-08 DIAGNOSIS — I10 Essential (primary) hypertension: Secondary | ICD-10-CM | POA: Diagnosis not present

## 2019-05-08 DIAGNOSIS — Z9889 Other specified postprocedural states: Secondary | ICD-10-CM | POA: Diagnosis not present

## 2019-05-08 DIAGNOSIS — I9789 Other postprocedural complications and disorders of the circulatory system, not elsewhere classified: Secondary | ICD-10-CM

## 2019-05-08 NOTE — Assessment & Plan Note (Signed)
He is to have some symptomatic PVCs in the past, but it seems to be really well controlled on current dose of beta-blocker.

## 2019-05-08 NOTE — Patient Instructions (Signed)
Medication Instructions: No changes  Tests Ordered: No orders of the defined types were placed in this encounter. --> You should be due to have your CT scan done by Dr. Vivi Martens office.  If that has not been ordered by the time I see you back in the spring, we can check it then.   Medication Changes: No orders of the defined types were placed in this encounter.   Disposition:  Follow up In March-April 2021

## 2019-05-08 NOTE — Assessment & Plan Note (Signed)
Labs were just checked in July.  Cholesterol looks great.  At bull's-eye target on current dose of statin.  No change

## 2019-05-08 NOTE — Progress Notes (Signed)
Virtual Visit via Telephone Note   This visit type was conducted due to national recommendations for restrictions regarding the COVID-19 Pandemic (e.g. social distancing) in an effort to limit this patient's exposure and mitigate transmission in our community.  Due to his co-morbid illnesses, this patient is at least at moderate risk for complications without adequate follow up.  This format is felt to be most appropriate for this patient at this time.  The patient did not have access to video technology/had technical difficulties with video requiring transitioning to audio format only (telephone).  All issues noted in this document were discussed and addressed.  No physical exam could be performed with this format.  Please refer to the patient's chart for his  consent to telehealth for [facility]   Patient has given verbal permission to conduct this visit via virtual appointment and to bill insurance 05/08/2019 11:16 PM     Evaluation Performed:  Follow-up visit  Date:  05/08/2019   ID:  Lawrence Liu, DOB 1955/11/27, MRN [id number]  Patient Location: Home Provider Location: Other:  Hospital Office  PCP:  [name], MD  Cardiologist:  Glenetta Hew, MD  Electrophysiologist:  None   Chief Complaint: 12-month follow-up  History of Present Illness:    Lawrence Liu is a 63 y.o. male with PMH notable for history of a sending aortic aneurysm repair-valve sparing (Dr. Cyndia Bent) with-(postop A. fib) who presents via audio/video conferencing for a telehealth visit today.  Lawrence Liu was last seen by me in February 2019 --> weaned off amiodarone, and increased Toprol to 100 mg daily  Admitted for laparoscopic cholecystectomy on July 24, 2018, he was seen by Jory Sims, NP for preop evaluation on December 9.  Interval History:  I am visiting with him today for what really is delayed annual follow-up.  The visit back in December 2019 was really preop for  cholecystectomy.  He says he is pretty much back into his general routine.  Obviously much like everyone else, he is gotten himself a little bit deconditioned with the COVID-19 restrictions, but is still pretty active with work - (with [employer]).  As such, he may have a little bit of exertional dyspnea, but no chest pain or pressure. The only thing he notes is that he may have some rare palpitations if he has been a little tired or anxious. ->  This will also occur when he may be a little dehydrated.  Cardiovascular review of symptoms: no chest pain or dyspnea on exertion positive for - palpitations and As noted negative for - edema, irregular heartbeat, orthopnea, paroxysmal nocturnal dyspnea, rapid heart rate, shortness of breath or Syncope/near syncope, TIA shows amaurosis fugax, claudication  The patient does not have symptoms concerning for COVID-19 infection (fever, chills, cough, or new shortness of breath).  The patient is practicing social distancing.  ROS:  Please see the history of present illness.    Review of Systems  Constitutional: Negative for malaise/fatigue and weight loss.  HENT: Negative for nosebleeds.   Respiratory: Negative for cough, shortness of breath and wheezing.   Gastrointestinal: Negative for blood in stool, constipation, melena, nausea and vomiting.  Genitourinary: Negative for hematuria.  Musculoskeletal: Positive for joint pain. Negative for falls.  Neurological: Positive for dizziness (Very rare) and headaches (Also pretty rare). Negative for tingling.  Psychiatric/Behavioral: Negative.     Past Medical History:  Diagnosis Date  . Alpha galactosidase deficiency    multiple tick bites  . Deep  vein thrombosis ([place]) 2004   Left deep vein thrombosis of left calf  . GERD (gastroesophageal reflux disease)   . History of blood transfusion    as a baby for jaundice  . Hypertension   . Nonobstructive atherosclerosis of coronary artery 09/2013   Mild  diffuse disease. No lesions greater than 30% noted. - confirmed by Cor CTA  . Pneumonia 1980's   walking pneumonia  . Status post ascending aortic aneurysm repair 08/2016   Dr. Cyndia Bent  . Thoracic ascending aortic aneurysm ([place]) 11/06/2013   CTA of chest November 2017 showed notable increase in size --> s/p Open (Valve Sparing) Repair Jan 2019)    Past Surgical History:  Procedure Laterality Date  . ASCENDING AORTIC ROOT REPLACEMENT N/A 08/15/2017   Procedure: ASCENDING AORTIC ROOT REPLACEMENT, (VALVE SPARING ROOT REPLACEMENT);  Surgeon: [name], MD;  Location: [place];  Service: Open Heart Surgery;  Laterality: N/A;  . CHOLECYSTECTOMY N/A 07/24/2018   Procedure: LAPAROSCOPIC CHOLECYSTECTOMY WITH INTRAOPERATIVE CHOLANGIOGRAM;  Surgeon: Alphonsa Overall, MD;  [facility];  Service: General;  Laterality: N/A;  . COLONOSCOPY WITH PROPOFOL N/A 11/23/2016   Procedure: COLONOSCOPY WITH PROPOFOL;  Surgeon: Juanita Craver, MD;  [facility];  Service: Endoscopy;  Laterality: N/A;  . colonscopy  5 1/2 yrs ago   polyps removed  . EYE SURGERY Bilateral   . LEFT HEART CATHETERIZATION WITH CORONARY ANGIOGRAM N/A 10/03/2013   Procedure: LEFT HEART CATHETERIZATION WITH CORONARY ANGIOGRAM;  Surgeon: Jolaine Artist, MD;  Location: [facility] CATH LAB;  Service: Cardiovascular:  Nonobstructive disease - LAD diffuse 20-30%. Small distal vessel. Circumflex ostial 30-40%. Large OM 1 with 30% mid. Small OM 2 and several small PL branches. Large dominant RCA with 30% mid and diffuse 20-30% distal.  . SHOULDER ARTHROSCOPY  07/15/2011   Procedure: ARTHROSCOPY SHOULDER;  Surgeon: Metta Clines Supple;  Location: [place];  Service: Orthopedics;  Laterality: Right;  RIGHT ARTHROSCOPY SUBACROMIAL DECOMPRESSION AND DISTAL CLAVICLE RESECTION  . TEE WITHOUT CARDIOVERSION N/A 08/15/2017   Procedure: TRANSESOPHAGEAL ECHOCARDIOGRAM (TEE);  Surgeon: [name], MD;  Location: [place];  Service: Open Heart Surgery;  Laterality:  N/A;  . TONSILLECTOMY  ~ 1970   adenoids also  . TRANSTHORACIC ECHOCARDIOGRAM  March 2015   Upper limit size LV. Mild LVH. EF 60%. Normal wall motion. Ascending aorta measures 48 mm at root tapering to 40 mm at proximal ascending. Mild RV dilation.     Current Meds  Medication Sig  . amLODipine-benazepril (LOTREL) 5-20 MG capsule Take 1 capsule by mouth daily.  . Ascorbic Acid (VITAMIN C) 1000 MG tablet Take 1,000 mg by mouth daily.  Marland Kitchen atorvastatin (LIPITOR) 20 MG tablet Take 1 tablet (20 mg total) by mouth daily.  . Cholecalciferol (VITAMIN D3) 5000 units CAPS Take 5,000 Units by mouth daily.  . Coenzyme Q10 (CO Q-10) 300 MG CAPS Take 300 mg by mouth daily.  . Cyanocobalamin (VITAMIN B-12) 5000 MCG TBDP Take 5,000 mcg by mouth daily.   . famotidine (PEPCID) 20 MG tablet Take 20 mg by mouth daily.  . fexofenadine (ALLEGRA) 180 MG tablet Take 180 mg by mouth daily.  . metoprolol succinate (TOPROL-XL) 100 MG 24 hr tablet Take 1 tablet (100 mg total) by mouth daily.  . Probiotic Product (PROBIOTIC PO) Take 1 tablet by mouth daily. PROBIOTICS 10  . vitamin E 400 UNIT capsule Take 800 Units by mouth daily.      Allergies:   Latex, Penicillins, and Sulfa antibiotics  Social History   Tobacco Use  . Smoking status: Never Smoker  . Smokeless tobacco: Never Used  Substance Use Topics  . Alcohol use: Yes    Comment: 10/02/2013 "hard apple cider maybe once/month"  . Drug use: No     Family Hx: The patient's family history includes Diabetes in his father and mother; Heart disease in his father.   Prior CV studies:   The following studies were reviewed today:  Echo - 09/2017: Left ventricle: The cavity size & thickness was normal. Systolic function was normal. EF 50% to 55%. No RWMA. Normal Diastolic Fxn.  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 Mild MR. Mod LA dilation   Labs/Other Tests and Data Reviewed:    EKG:  No ECG reviewed.  Recent Labs: 03/01/2019: ALT 17;  BUN 18; Creat 0.93; Hemoglobin 14.0; Platelets 207; Potassium 4.3; Sodium 141   Recent Lipid Panel Lab Results  Component Value Date/Time   CHOL 111 03/01/2019 10:44 AM   CHOL 105 10/18/2016 09:56 AM   TRIG 94 03/01/2019 10:44 AM   HDL 43 03/01/2019 10:44 AM   HDL 34 (L) 10/18/2016 09:56 AM   CHOLHDL 2.6 03/01/2019 10:44 AM   LDLCALC 50 03/01/2019 10:44 AM    Wt Readings from Last 3 Encounters:  03/01/19 220 lb (99.8 kg)  10/03/18 219 lb (99.3 kg)  07/24/18 216 lb 6 oz (98.1 kg)     Objective:    Vital Signs:  BP 136/70  average BP 130s/70s NAD A&O x 3. - normal Mood & Affect Non-labored respirations  ASSESSMENT & PLAN:    Lawrence Liu is doing well.  Stable.  Blood pressure, heart rate, palpitations all normal.  Cholesterol well controlled.  Simply need to reestablish imaging screening for his aortic root graft monitoring.  No medication changes  Problem List Items Addressed This Visit    Essential hypertension (Chronic)    Blood pressure seems to be pretty well controlled on Lotrel and Toprol.  Minimal palpitations and minimal dizziness.      PVC's (premature ventricular contractions) (Chronic)    He is to have some symptomatic PVCs in the past, but it seems to be really well controlled on current dose of beta-blocker.      Aortic root dilatation (HCC) (Chronic)    Status post valve sparing aortic root replacement in January 2019.  Continue to monitor blood pressure.  Imaging schedule for follow-up will be set by Dr. Cyndia Bent      Atherogenic dyslipidemia (Chronic)    Labs were just checked in July.  Cholesterol looks great.  At bull's-eye target on current dose of statin.  No change      S/P aortic aneurysm repair - Primary (Chronic)    He is due for follow-up with Dr. Arvid Right.  He was supposed to have chest CT angiogram done earlier this year but it got delayed due to COVID-19 restrictions.  If not done when I see him in follow-up, we can check a CTA of the  chest/aorta.      Postoperative atrial fibrillation (HCC) (Chronic)    No further breakthrough episodes since his surgery.  For now we would not anticoagulate.         COVID-19 Education: The signs and symptoms of COVID-19 were discussed with the patient and how to seek care for testing (follow up with PCP or arrange E-visit).   The importance of social distancing was discussed today.  Time:   Today, I have spent 13 minutes with  the patient with telehealth technology discussing the above problems.     Medication Adjustments/Labs and Tests Ordered: Current medicines are reviewed at length with the patient today.  Concerns regarding medicines are outlined above.  Medication Instructions: No changes  Tests Ordered: No orders of the defined types were placed in this encounter. --> You should be due to have your CT scan done by Dr. Vivi Martens office.  If that has not been ordered by the time I see you back in the spring, we can check it then.   Medication Changes: No orders of the defined types were placed in this encounter.   Disposition:  Follow up In March-April 2021    Signed, Glenetta Hew, MD  05/08/2019 11:16 PM    [place]

## 2019-05-08 NOTE — Assessment & Plan Note (Addendum)
Blood pressure seems to be pretty well controlled on Lotrel and Toprol.  Minimal palpitations and minimal dizziness.

## 2019-05-08 NOTE — Assessment & Plan Note (Signed)
No further breakthrough episodes since his surgery.  For now we would not anticoagulate.

## 2019-05-08 NOTE — Assessment & Plan Note (Signed)
Status post valve sparing aortic root replacement in January 2019.  Continue to monitor blood pressure.  Imaging schedule for follow-up will be set by Dr. Cyndia Bent

## 2019-05-08 NOTE — Assessment & Plan Note (Signed)
He is due for follow-up with Dr. Arvid Right.  He was supposed to have chest CT angiogram done earlier this year but it got delayed due to COVID-19 restrictions.  If not done when I see him in follow-up, we can check a CTA of the chest/aorta.

## 2019-05-22 MED FILL — ATORVASTATIN 20 MG TABLET: 20 | 90 days supply | Qty: 90 | Fill #1

## 2019-05-22 MED FILL — METOPROLOL SUCCINATE ER 100: 100 | 90 days supply | Qty: 90 | Fill #1

## 2019-06-04 MED FILL — AMLODIPINE-BENAZEPRIL 5-20: 5-20 | 90 days supply | Qty: 90 | Fill #1

## 2019-09-06 ENCOUNTER — Other Ambulatory Visit: Payer: Self-pay | Admitting: Adult Health

## 2019-09-06 MED FILL — METOPROLOL SUCCINATE ER 100: 100 | 90 days supply | Qty: 90 | Fill #0

## 2019-09-06 MED FILL — AMLODIPINE-BENAZEPRIL 5-20: 5-20 | 90 days supply | Qty: 90 | Fill #2

## 2019-09-07 MED FILL — ATORVASTATIN 20 MG TABLET: 20 | 90 days supply | Qty: 90 | Fill #0

## 2019-10-31 ENCOUNTER — Encounter (HOSPITAL_COMMUNITY): Payer: Self-pay | Admitting: Emergency Medicine

## 2019-10-31 ENCOUNTER — Other Ambulatory Visit: Payer: Self-pay

## 2019-10-31 ENCOUNTER — Emergency Department (HOSPITAL_COMMUNITY)
Admission: EM | Admit: 2019-10-31 | Discharge: 2019-10-31 | Disposition: A | Discharge status: Home / Self Care | Payer: 59 | Attending: Emergency Medicine | Admitting: Emergency Medicine

## 2019-10-31 DIAGNOSIS — Z91040 Latex allergy status: Secondary | ICD-10-CM | POA: Diagnosis not present

## 2019-10-31 DIAGNOSIS — Z79899 Other long term (current) drug therapy: Secondary | ICD-10-CM | POA: Insufficient documentation

## 2019-10-31 DIAGNOSIS — I10 Essential (primary) hypertension: Secondary | ICD-10-CM | POA: Insufficient documentation

## 2019-10-31 DIAGNOSIS — Z7982 Long term (current) use of aspirin: Secondary | ICD-10-CM | POA: Diagnosis not present

## 2019-10-31 DIAGNOSIS — I2510 Atherosclerotic heart disease of native coronary artery without angina pectoris: Secondary | ICD-10-CM | POA: Insufficient documentation

## 2019-10-31 DIAGNOSIS — R0602 Shortness of breath: Secondary | ICD-10-CM | POA: Diagnosis not present

## 2019-10-31 DIAGNOSIS — T782XXA Anaphylactic shock, unspecified, initial encounter: Secondary | ICD-10-CM | POA: Insufficient documentation

## 2019-10-31 LAB — BASIC METABOLIC PANEL
Anion gap: 11 (ref 5–15)
BUN: 22 mg/dL (ref 8–23)
CO2: 21 mmol/L — ABNORMAL LOW (ref 22–32)
Calcium: 8.8 mg/dL — ABNORMAL LOW (ref 8.9–10.3)
Chloride: 106 mmol/L (ref 98–111)
Creatinine, Ser: 0.98 mg/dL (ref 0.61–1.24)
GFR calc Af Amer: >60 mL/min (ref >60)
GFR calc non Af Amer: >60 mL/min (ref >60)
Glucose, Bld: 126 mg/dL — ABNORMAL HIGH (ref 70–99)
Potassium: 3.4 mmol/L — ABNORMAL LOW (ref 3.5–5.1)
Sodium: 138 mmol/L (ref 135–145)

## 2019-10-31 LAB — CBC WITH DIFFERENTIAL/PLATELET
Abs Immature Granulocytes: 0.02 10*3/uL (ref 0.00–0.07)
Basophils Absolute: 0 10*3/uL (ref 0.0–0.1)
Basophils Relative: 0 %
Eosinophils Absolute: 0.5 10*3/uL (ref 0.0–0.5)
Eosinophils Relative: 5 %
HCT: 43.1 % (ref 39.0–52.0)
Hemoglobin: 14 g/dL (ref 13.0–17.0)
Immature Granulocytes: 0 %
Lymphocytes Relative: 50 %
Lymphs Abs: 5 10*3/uL — ABNORMAL HIGH (ref 0.7–4.0)
MCH: 26.6 pg (ref 26.0–34.0)
MCHC: 32.5 g/dL (ref 30.0–36.0)
MCV: 81.8 fL (ref 80.0–100.0)
Monocytes Absolute: 0.7 10*3/uL (ref 0.1–1.0)
Monocytes Relative: 7 %
Neutro Abs: 3.9 10*3/uL (ref 1.7–7.7)
Neutrophils Relative %: 38 %
Platelets: 260 10*3/uL (ref 150–400)
RBC: 5.27 MIL/uL (ref 4.22–5.81)
RDW: 14 % (ref 11.5–15.5)
WBC: 10.1 10*3/uL (ref 4.0–10.5)
nRBC: 0 % (ref 0.0–0.2)

## 2019-10-31 LAB — TROPONIN I (HIGH SENSITIVITY)
Troponin I (High Sensitivity): 9 ng/L (ref <18)
Troponin I (High Sensitivity): 8 ng/L (ref <18)

## 2019-10-31 MED ORDER — PREDNISONE 50 MG PO TABS: ORAL_TABLET | ORAL | 0 refills | Status: DC

## 2019-10-31 MED ORDER — METHYLPREDNISOLONE SODIUM SUCC 125 MG IJ SOLR
125.0000 mg | Freq: Once | INTRAMUSCULAR | Status: AC
  Administered 2019-10-31: 125 mg via INTRAVENOUS
  Filled 2019-10-31: qty 2

## 2019-10-31 MED ORDER — FAMOTIDINE IN NACL 20-0.9 MG/50ML-% IV SOLN
20.0000 mg | Freq: Once | INTRAVENOUS | Status: AC
  Administered 2019-10-31: 20 mg via INTRAVENOUS
  Filled 2019-10-31: qty 50

## 2019-10-31 MED ORDER — EPINEPHRINE 0.3 MG/0.3ML IJ SOAJ: 0.3000 mg | INTRAMUSCULAR | 0 refills | Status: AC | PRN

## 2019-10-31 MED ORDER — DIPHENHYDRAMINE HCL 50 MG/ML IJ SOLN
25.0000 mg | Freq: Once | INTRAMUSCULAR | Status: AC
  Administered 2019-10-31: 25 mg via INTRAVENOUS
  Filled 2019-10-31: qty 1

## 2019-10-31 MED ORDER — EPINEPHRINE 0.3 MG/0.3ML IJ SOAJ
0.3000 mg | Freq: Once | INTRAMUSCULAR | Status: AC
  Administered 2019-10-31: 0.3 mg via INTRAMUSCULAR
  Filled 2019-10-31: qty 0.3

## 2019-10-31 MED FILL — EPINEPHRINE 0.3 MG AUTO-INJ: 0.3 | 2 days supply | Qty: 2 | Fill #0

## 2019-10-31 MED FILL — predniSONE 50 MG TABS: 50 | 5 days supply | Qty: 5 | Fill #0

## 2019-10-31 NOTE — Discharge Instructions (Signed)
Take the steroids as prescribed.  You may use Benadryl as needed for itching.  Use the EpiPen if you experience another severe allergic reaction with difficulty breathing, tongue or lip swelling, throat tightness.  If you use the epinephrine pen, you must come to the emergency department.  Return to the ED with chest pain, shortness of breath, any other concerns.

## 2019-10-31 NOTE — ED Provider Notes (Signed)
64 year old male here with hives and scratchy throat.  Treated for presumptive allergic reaction.  Has received subcu epi along with steroids Benadryl Pepcid.  Plan is to reassess and if symptoms controlled or improved can be discharged. Physical Exam  BP 139/82   Pulse 83   Temp 97.6 F (36.4 C)   Resp 16   Ht 6\' 1"  (1.854 m)   Wt 99.8 kg   SpO2 96%   BMI 29.03 kg/m   Physical Exam  ED Course/Procedures     Procedures  MDM  Patient not seen by me. Dr Melanee Left re-evaluated patient and discharged.        Hayden Rasmussen, MD 10/31/19 1745

## 2019-10-31 NOTE — ED Provider Notes (Signed)
[facility] EMERGENCY DEPARTMENT Provider Note   CSN: [id number] Arrival date & time: 10/31/19  0409     History Chief Complaint  Patient presents with  . Allergic Reaction    Lawrence Liu is a 64 y.o. male.  Level 5 caveat for acuity of condition.  Patient arrives with difficulty breathing, hives, itching, scratchy throat.  States this started about 45 minutes prior to arrival.  He went to bed at midnight feeling well.  He woke up around 3:30 AM with rash and difficulty breathing and throat tightness.  He took 75 mg of Benadryl without relief.  Feels like his throat is getting tight.  He denies any tongue or lip swelling.  Denies any chest pain.  Does feel short of breath.  No coughing or wheezing.  Feels itchy all over with diffuse urticarial rash. No previous allergic reaction like this.  Known allergy to latex and penicillin and sulfa. Did mowed the grass yesterday but denies having a reaction like this in the past.  No new exposures to medications, clothing, detergents or cleaning products.  The history is provided by the patient.  Allergic Reaction Presenting symptoms: rash        Past Medical History:  Diagnosis Date  . Alpha galactosidase deficiency    multiple tick bites  . Deep vein thrombosis ([place]) 2004   Left deep vein thrombosis of left calf  . GERD (gastroesophageal reflux disease)   . History of blood transfusion    as a baby for jaundice  . Hypertension   . 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 No lesions greater than 30% noted. - confirmed by Cor CTA  . Pneumonia 1980's   walking pneumonia  . Status post ascending aortic aneurysm repair 08/2016   Dr. Cyndia Bent  . Thoracic ascending aortic aneurysm ([place]) 11/06/2013   CTA of chest November 2017 showed notable increase in size --> s/p Open (Valve Sparing) Repair Jan 2019)    Patient Active Problem List   Diagnosis Date Noted  . Biliary dyskinesia 06/29/2018    . Postoperative anemia 09/10/2017  . Postoperative atrial fibrillation ([place]) 09/08/2017  . S/P aortic aneurysm repair 08/15/2017  . Essential hypertension 10/01/2016  . Aortic root dilatation ([place]) 11/06/2013  . 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 PVC's (premature ventricular contractions) 10/02/2013  . History of DVT (deep vein thrombosis) 10/02/2013  . GERD (gastroesophageal reflux disease)   . Arthritis   . Deep vein thrombosis [facility])     Past Surgical History:  Procedure Laterality Date  . ASCENDING AORTIC ROOT REPLACEMENT N/A 08/15/2017   Procedure: ASCENDING AORTIC ROOT REPLACEMENT, (VALVE SPARING ROOT REPLACEMENT);  Surgeon: [name], MD;  Location: [place];  Service: Open Heart Surgery;  Laterality: N/A;  . CHOLECYSTECTOMY N/A 07/24/2018   Procedure: LAPAROSCOPIC CHOLECYSTECTOMY WITH INTRAOPERATIVE CHOLANGIOGRAM;  Surgeon: Alphonsa Overall, MD;  Location: [facility];  Service: General;  Laterality: N/A;  . COLONOSCOPY WITH PROPOFOL N/A 11/23/2016   Procedure: COLONOSCOPY WITH PROPOFOL;  Surgeon: Juanita Craver, MD;  Location: [facility];  Service: Endoscopy;  Laterality: N/A;  . colonscopy  5 1/2 yrs ago   polyps removed  . EYE SURGERY Bilateral   . LEFT HEART CATHETERIZATION WITH CORONARY ANGIOGRAM N/A 10/03/2013   Procedure: LEFT HEART CATHETERIZATION WITH CORONARY ANGIOGRAM;  Surgeon: Jolaine Artist, MD;  Location: [facility] CATH LAB;  Service: Cardiovascular:  Nonobstructive disease - LAD diffuse 20-30%. Small distal vessel. Circumflex ostial 30-40%. Large OM 1 with 30% mid.  Small OM 2 and several small PL branches. Large dominant RCA with 30% mid and diffuse 20-30% distal.  . SHOULDER ARTHROSCOPY  07/15/2011   Procedure: ARTHROSCOPY SHOULDER;  Surgeon: Metta Clines Supple;  Location: [place];  Service: Orthopedics;  Laterality: Right;  RIGHT ARTHROSCOPY SUBACROMIAL DECOMPRESSION AND DISTAL CLAVICLE RESECTION  . TEE WITHOUT CARDIOVERSION N/A 08/15/2017   Procedure: TRANSESOPHAGEAL  ECHOCARDIOGRAM (TEE);  Surgeon: [name], MD;  Location: [place];  Service: Open Heart Surgery;  Laterality: N/A;  . TONSILLECTOMY  ~ 1970   adenoids also  . TRANSTHORACIC ECHOCARDIOGRAM  March 2015   Upper limit size LV. Mild LVH. EF 60%. Normal wall motion. Ascending aorta measures 48 mm at root tapering to 40 mm at proximal ascending. Mild RV dilation.       Family History  Problem Relation Age of Onset  . Heart disease Father   . Diabetes Father   . Diabetes Mother     Social History   Tobacco Use  . Smoking status: Never Smoker  . Smokeless tobacco: Never Used  Substance Use Topics  . Alcohol use: Yes    Comment: 10/02/2013 "hard apple cider maybe once/month"  . Drug use: No    Home Medications Prior to Admission medications   Medication Sig Start Date End Date Taking? Authorizing Provider  amLODipine-benazepril (LOTREL) 5-20 MG capsule Take 1 capsule by mouth daily. 03/01/19   [name], MD  Ascorbic Acid (VITAMIN C) 1000 MG tablet Take 1,000 mg by mouth daily.    [provider]  aspirin 81 MG tablet Take 4 tablets (325 mg total) by mouth daily. Patient taking differently: Take 81 mg by mouth daily.  07/10/18   Lendon Colonel, NP  atorvastatin (LIPITOR) 20 MG tablet TAKE 1 TABLET (20 MG TOTAL) BY MOUTH DAILY. 09/06/19   Leonie Man, MD  Cholecalciferol (VITAMIN D3) 5000 units CAPS Take 5,000 Units by mouth daily.    [provider]  Coenzyme Q10 (CO Q-10) 300 MG CAPS Take 300 mg by mouth daily.    [provider]  Cyanocobalamin (VITAMIN B-12) 5000 MCG TBDP Take 5,000 mcg by mouth daily.     [provider]  famotidine (PEPCID) 20 MG tablet Take 20 mg by mouth daily.    [provider]  fexofenadine (ALLEGRA) 180 MG tablet Take 180 mg by mouth daily.    [provider]  metoprolol succinate (TOPROL-XL) 100 MG 24 hr tablet Take 1 tablet (100 mg total) by mouth daily. 03/01/19   [name], MD    Probiotic Product (PROBIOTIC PO) Take 1 tablet by mouth daily. PROBIOTICS 10    [provider]  vitamin E 400 UNIT capsule Take 800 Units by mouth daily.     [provider]    Allergies    Latex, Penicillins, and Sulfa antibiotics  Review of Systems   Review of Systems  Constitutional: Negative for activity change, appetite change and fever.  HENT: Negative for congestion and rhinorrhea.   Eyes: Negative for visual disturbance.  Respiratory: Positive for shortness of breath. Negative for cough and chest tightness.   Cardiovascular: Negative for chest pain.  Gastrointestinal: Negative for abdominal pain, nausea and vomiting.  Genitourinary: Negative for dysuria and hematuria.  Musculoskeletal: Negative for arthralgias and myalgias.  Skin: Positive for rash.  Neurological: Negative for dizziness, weakness and headaches.   all other systems are negative except as noted in the HPI and PMH.    Physical Exam  Updated Vital Signs BP (!) 156/96   Pulse (!) 122   Temp 97.6 F (36.4 C)   Resp 20   Ht 6\' 1"  (1.854 m)   Wt 99.8 kg   SpO2 99%   BMI 29.03 kg/m   Physical Exam Vitals and nursing note reviewed.  Constitutional:      General: He is not in acute distress.    Appearance: Normal appearance. He is well-developed and normal weight.  HENT:     Head: Normocephalic and atraumatic.     Mouth/Throat:     Pharynx: No oropharyngeal exudate.     Comments: No tongue or lip swelling Eyes:     Conjunctiva/sclera: Conjunctivae normal.     Pupils: Pupils are equal, round, and reactive to light.  Neck:     Comments: No meningismus. Cardiovascular:     Rate and Rhythm: Regular rhythm. Tachycardia present.     Heart sounds: Normal heart sounds. No murmur.  Pulmonary:     Effort: Pulmonary effort is normal. No respiratory distress.     Breath sounds: Normal breath sounds. No wheezing.  Abdominal:     Palpations: Abdomen is soft.     Tenderness: There is no  abdominal tenderness. There is no guarding or rebound.  Musculoskeletal:        General: No tenderness. Normal range of motion.     Cervical back: Normal range of motion and neck supple.  Skin:    General: Skin is warm.     Findings: Rash present.     Comments: Diffuse urticarial rash  Neurological:     General: No focal deficit present.     Mental Status: He is alert and oriented to person, place, and time. Mental status is at baseline.     Cranial Nerves: No cranial nerve deficit.     Motor: No abnormal muscle tone.     Coordination: Coordination normal.     Comments: No ataxia on finger to nose bilaterally. No pronator drift. 5/5 strength throughout. CN 2-12 intact.Equal grip strength. Sensation intact.   Psychiatric:        Behavior: Behavior normal.     ED Results / Procedures / Treatments   Labs (all labs ordered are listed, but only abnormal results are displayed) Labs Reviewed  CBC WITH DIFFERENTIAL/PLATELET - Abnormal; Notable for the following components:      Result Value   Lymphs Abs 5.0 (*)    All other components within normal limits  BASIC METABOLIC PANEL - Abnormal; Notable for the following components:   Potassium 3.4 (*)    CO2 21 (*)    Glucose, Bld 126 (*)    Calcium 8.8 (*)    All other components within normal limits  TROPONIN I (HIGH SENSITIVITY)  TROPONIN I (HIGH SENSITIVITY)    EKG EKG Interpretation  Date/Time:  Wednesday October 31 2019 04:16:10 EDT Ventricular Rate:  126 PR Interval:    QRS Duration: 105 QT Interval:  313 QTC Calculation: 454 R Axis:   74 Text Interpretation: Sinus tachycardia Borderline ST depression, diffuse leads after epinephrine Rate faster minimal diffuse ST depressions ST elevation avR Confirmed by Ezequiel Essex [phone]) on 10/31/2019 4:34:45 AM   Radiology No results found.  Procedures .Critical Care Performed by: Ezequiel Essex, MD Authorized by: Ezequiel Essex, MD   Critical care provider statement:      Critical care time (minutes):  35   Critical care was necessary to treat or prevent imminent or life-threatening deterioration of the following  conditions:  Shock (Anaphylaxis with throat tightness, difficulty breathing, shortness of breath)   Critical care was time spent personally by me on the following activities:  Discussions with consultants, evaluation of patient's response to treatment, examination of patient, ordering and performing treatments and interventions, ordering and review of laboratory studies, ordering and review of radiographic studies, pulse oximetry, re-evaluation of patient's condition, obtaining history from patient or surrogate and review of old charts   (including critical care time)  Medications Ordered in ED Medications  famotidine (PEPCID) IVPB 20 mg premix (20 mg Intravenous New Bag/Given 10/31/19 0425)  methylPREDNISolone sodium succinate (SOLU-MEDROL) 125 mg/2 mL injection 125 mg (125 mg Intravenous Given 10/31/19 0425)  diphenhydrAMINE (BENADRYL) injection 25 mg (25 mg Intravenous Given 10/31/19 0425)  EPINEPHrine (EPI-PEN) injection 0.3 mg (0.3 mg Intramuscular Given 10/31/19 0425)    ED Course  I have reviewed the triage vital signs and the nursing notes.  Pertinent labs & imaging results that were available during my care of the patient were reviewed by me and considered in my medical decision making (see chart for details).    MDM Rules/Calculators/A&P                      Anaphylaxis to unknown allergen. IM epi given on arrival.  Patient given steroids and antihistamines as well.  EKG shows some ST depression anteriorly and laterally but this was obtained after epinephrine was given.  Patient denies chest pain.  Patient noting improvement after medications given.  Labs reassuring with negative troponin.  Multiple rechecks throughout ED course.  Rash is improved.  Throat tightness is improved.  No tongue or lip swelling.  Plan to observe for total  of 3 hours after epinephrine injection.  Patient without recurrent symptoms.  He feels back to baseline.  No tongue or lip swelling.  No difficulty breathing or chest pain.  Repeat EKG shows improvement in ST segments with remaining T wave inversion in V1 and V2. This is similar to December 2019. Troponin negative x2.  Patient never did have chest pain.  He appears appropriate for discharge home.  IM epinephrine sent to his pharmacy as well as a 5-day course of steroids.  Patient well versed in how to use these medications. Return precautions discussed.  Final Clinical Impression(s) / ED Diagnoses Final diagnoses:  Anaphylaxis, initial encounter    Rx / DC Orders ED Discharge Orders    None       Raiford Fetterman, Annie Main, MD 10/31/19 [phone]

## 2019-10-31 NOTE — ED Triage Notes (Signed)
Pt C/O hives, itching, and scratchy throat upon waking tonight. Denies SOB or trouble breathing.

## 2019-10-31 NOTE — ED Notes (Signed)
Patient ambulated to bathroom.

## 2020-02-08 ENCOUNTER — Encounter: Payer: Self-pay | Admitting: Cardiology

## 2020-02-08 ENCOUNTER — Other Ambulatory Visit: Payer: Self-pay

## 2020-02-08 ENCOUNTER — Ambulatory Visit (INDEPENDENT_AMBULATORY_CARE_PROVIDER_SITE_OTHER): Payer: 59 | Admitting: Cardiology

## 2020-02-08 VITALS — BP 114/72 | HR 74 | Ht 73.0 in | Wt 224.6 lb

## 2020-02-08 DIAGNOSIS — I493 Ventricular premature depolarization: Secondary | ICD-10-CM

## 2020-02-08 DIAGNOSIS — I10 Essential (primary) hypertension: Secondary | ICD-10-CM | POA: Diagnosis not present

## 2020-02-08 DIAGNOSIS — Z8679 Personal history of other diseases of the circulatory system: Secondary | ICD-10-CM | POA: Diagnosis not present

## 2020-02-08 DIAGNOSIS — I4891 Unspecified atrial fibrillation: Secondary | ICD-10-CM | POA: Diagnosis not present

## 2020-02-08 DIAGNOSIS — Z9889 Other specified postprocedural states: Secondary | ICD-10-CM

## 2020-02-08 DIAGNOSIS — I9789 Other postprocedural complications and disorders of the circulatory system, not elsewhere classified: Secondary | ICD-10-CM

## 2020-02-08 DIAGNOSIS — I712 Thoracic aortic aneurysm, without rupture, unspecified: Secondary | ICD-10-CM

## 2020-02-08 DIAGNOSIS — E785 Hyperlipidemia, unspecified: Secondary | ICD-10-CM | POA: Diagnosis not present

## 2020-02-08 NOTE — Patient Instructions (Signed)
Medication Instructions:  No changes  *If you need a refill on your cardiac medications before your next appointment, please call your pharmacy*   Lab Work: BMP == if need for CT of Aorta If you have labs (blood work) drawn today and your tests are completely normal, you will receive your results only by: Marland Kitchen MyChart Message (if you have MyChart) OR . A paper copy in the mail If you have any lab test that is abnormal or we need to change your treatment, we will call you to review the results.   Testing/Procedures:  schedule a CT angio of Aorta - schedule at [facility]  Non-Cardiac CT scanning, (CAT scanning), is a noninvasive, special x-ray that produces cross-sectional images of the body using x-rays and a computer. CT scans help physicians diagnose and treat medical conditions. For some CT exams, a contrast material is used to enhance visibility in the area of the body being studied. CT scans provide greater clarity and reveal more details than regular x-ray exams.    Follow-Up: At [facility], you and your health needs are our priority.  As part of our continuing mission to provide you with exceptional heart care, we have created designated Provider Care Teams.  These Care Teams include your primary Cardiologist (physician) and Advanced Practice Providers (APPs -  Physician Assistants and Nurse Practitioners) who all work together to provide you with the care you need, when you need it.    Your next appointment:   12 month(s)  The format for your next appointment:   In Person  Provider:   Glenetta Hew, MD   Other Instructions

## 2020-02-08 NOTE — Progress Notes (Signed)
Primary Care Provider: [name], MD Cardiologist: Glenetta Hew, MD Electrophysiologist: None  Clinic Note: Chief Complaint  Patient presents with  . Follow-up    No symptoms  . Thoracic Aortic Aneurysm    Status post repair     HPI:    Lawrence Liu is a 64 y.o. male with a history of VALVE SPARING ASCENDING AORTIC ANEURYSM REPAIR (Dr. Cyndia Bent) complicated by postop A. fib who presents today for routine follow-up.   ASCENDING AORTIC ANEURYSM:  Sept 2018: CTA CHEST - with Coronary CTA   Dilated aortic root including sinotubular junction. Sinuses size 49 x 47 x 43 mm. The maximum diameter across sinuses measures 55 mm and is unchanged from the prior study on 06/22/2016. Normal size of the ascending aorta, aortic arch and descending aorta. No calcifications and no dissection.  Cor CTA: Coronary calcium score of 153. Normal Coronary origins, RCA dominant. Mild non-obstructive plaque in the RCA, LAD and OM1. Aggressive risk factor modification is recommended  Jan 2019 - : ASCENDING AORTIC ROOT REPLACEMENT, (VALVE SPARING ROOT REPLACEMENT);  [name], MD;  [name] was last seen on May 08, 2019 via telemedicine.  At that time he was simply on Toprol, no longer on amiodarone.  Was back to his baseline routine.  A little bit deconditioned during the initial stages of COVID-19 restriction.  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  He noted a little exertional dyspnea from being deconditioned, no chest pain or pressure.  Rare palpitations when anxious or nervous.  No further breakthrough A. fib. --> Was due to have follow-up CT angiogram to reevaluate his aortic root (scheduling delayed due to COVID-19) -> last documented CT was was with coronary CTA in September 2018 -> has not had Post-Op CTA.   Recent Hospitalizations: None  Reviewed  CV studies:    The following studies were reviewed today: (if available, images/films reviewed:  From Epic Chart or Care Everywhere) . None since 2019  Interval History:   [name] returns here today overall doing pretty well cardiac standpoint.  He is pretty active, but he played 18 holes of golf earlier today.  He denies any exertional angina or dyspnea. 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  Otherwise he does fine.  Sometimes when he gets short of breath if he is his heart skipping, but usually not.  He also notes some mild end of day swelling.  It does usually go down at night when he puts his feet up, but has not really paid to much attention to it.  CV Review of Symptoms (Summary) Cardiovascular ROS: positive for - Mild and today swelling, and exertional dyspnea with significant exertion. negative for - chest pain, irregular heartbeat, orthopnea, palpitations, paroxysmal nocturnal dyspnea, rapid heart rate, shortness of breath or Syncope/near syncope, TIA/amaurosis fugax, claudication  The patient does not have symptoms concerning for COVID-19 infection (fever, chills, cough, or new shortness of breath).  The patient is practicing social distancing & Masking.  Immunization History  Administered Date(s) Administered  . Influenza-Unspecified 04/02/2013, 05/30/2015, 04/16/2017  . Pneumococcal Polysaccharide-23 08/23/2017  . Tdap 08/03/2007, 03/01/2019   Covid (PHizer) Vaccine - Dec 2020   REVIEWED OF SYSTEMS   Review of Systems  Constitutional: Negative for malaise/fatigue and weight loss.  HENT: Negative for congestion and nosebleeds.  Respiratory: Negative for cough and shortness of breath.   Cardiovascular: Positive for leg swelling (Mild end of day swelling).  Gastrointestinal: Negative for abdominal pain, blood in stool and melena.  Genitourinary: Negative for hematuria.  Musculoskeletal: Negative for joint pain and myalgias.  Neurological: Negative for  dizziness and focal weakness.  Psychiatric/Behavioral: Negative.     I have reviewed and (if needed) personally updated the patient's problem list, medications, allergies, past medical and surgical history, social and family history.   PAST MEDICAL HISTORY   Past Medical History:  Diagnosis Date  . Alpha galactosidase deficiency    multiple tick bites  . Deep vein thrombosis ([place]) 2004   Left deep vein thrombosis of left calf  . GERD (gastroesophageal reflux disease)   . History of blood transfusion    as a baby for jaundice  . Hypertension   . 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 No lesions greater than 30% noted. - confirmed by Cor CTA  . Pneumonia 1980's   walking pneumonia  . Status post ascending aortic aneurysm repair 08/2016   Dr. Cyndia Bent  . Thoracic ascending aortic aneurysm ([place]) 11/06/2013   CTA of chest November 2017 showed notable increase in size --> s/p Open (Valve Sparing) Repair Jan 2019)    PAST SURGICAL HISTORY   Past Surgical History:  Procedure Laterality Date  . ASCENDING AORTIC ROOT REPLACEMENT N/A 08/15/2017   Procedure: ASCENDING AORTIC ROOT REPLACEMENT, (VALVE SPARING ROOT REPLACEMENT);  Surgeon: [name], MD;  Location: [place];  Service: Open Heart Surgery;  Laterality: N/A;  . CHOLECYSTECTOMY N/A 07/24/2018   Procedure: LAPAROSCOPIC CHOLECYSTECTOMY WITH INTRAOPERATIVE CHOLANGIOGRAM;  Surgeon: Alphonsa Overall, MD;  Location: [facility];  Service: General;  Laterality: N/A;  . COLONOSCOPY WITH PROPOFOL N/A 11/23/2016   Procedure: COLONOSCOPY WITH PROPOFOL;  Surgeon: Juanita Craver, MD;  Location: [facility];  Service: Endoscopy;  Laterality: N/A;  . colonscopy  5 1/2 yrs ago   polyps removed  . EYE SURGERY Bilateral   . LEFT HEART CATHETERIZATION WITH CORONARY ANGIOGRAM N/A 10/03/2013   Procedure: LEFT HEART CATHETERIZATION WITH CORONARY ANGIOGRAM;  Surgeon: Jolaine Artist, MD;  Location[facility];   Service: Cardiovascular:  Nonobstructive disease - LAD diffuse 20-30%. Small distal vessel. Circumflex ostial 30-40%. Large OM 1 with 30% mid. Small OM 2 and several small PL branches. Large dominant RCA with 30% mid and diffuse 20-30% distal.  . SHOULDER ARTHROSCOPY  07/15/2011   Procedure: ARTHROSCOPY SHOULDER;  Surgeon: Metta Clines Supple;  Location: [place];  Service: Orthopedics;  Laterality: Right;  RIGHT ARTHROSCOPY SUBACROMIAL DECOMPRESSION AND DISTAL CLAVICLE RESECTION  . TEE WITHOUT CARDIOVERSION N/A 08/15/2017   Procedure: TRANSESOPHAGEAL ECHOCARDIOGRAM (TEE);  Surgeon: [name], MD;  Location: [place];  Service: Open Heart Surgery;  Laterality: N/A;  . TONSILLECTOMY  ~ 1970   adenoids also  . TRANSTHORACIC ECHOCARDIOGRAM  March 2015   Upper limit size LV. Mild LVH. EF 60%. Normal wall motion. Ascending aorta measures 48 mm at root tapering to 40 mm at proximal ascending. Mild RV dilation.    MEDICATIONS/ALLERGIES   Current Meds  Medication Sig  . amLODipine-benazepril (LOTREL) 5-20 MG capsule Take 1 capsule by mouth daily.  . Ascorbic Acid (VITAMIN C) 1000 MG tablet Take 1,000 mg by mouth daily.  Marland Kitchen aspirin 81 MG tablet Take 4 tablets (325 mg total) by mouth daily. (Patient taking differently: Take 81 mg by mouth daily. )  . atorvastatin (LIPITOR) 20  MG tablet TAKE 1 TABLET (20 MG TOTAL) BY MOUTH DAILY.  Marland Kitchen Cholecalciferol (VITAMIN D3) 5000 units CAPS Take 5,000 Units by mouth daily.  . Coenzyme Q10 (CO Q-10) 300 MG CAPS Take 300 mg by mouth daily.  . Cyanocobalamin (VITAMIN B-12) 5000 MCG TBDP Take 5,000 mcg by mouth daily.   Marland Kitchen EPINEPHrine (EPIPEN 2-PAK) 0.3 mg/0.3 mL IJ SOAJ injection Inject 0.3 mLs (0.3 mg total) into the muscle as needed for anaphylaxis (for severe allergic reaction with difficulty breathing, tongue or lip swelling, throat tightness).  . famotidine (PEPCID) 20 MG tablet Take 20 mg by mouth daily.  . fexofenadine (ALLEGRA) 180 MG tablet Take 180 mg by mouth daily.    . metoprolol succinate (TOPROL-XL) 100 MG 24 hr tablet Take 1 tablet (100 mg total) by mouth daily.  . Probiotic Product (PROBIOTIC PO) Take 1 tablet by mouth daily. PROBIOTICS 10  . vitamin E 400 UNIT capsule Take 800 Units by mouth daily.     Allergies  Allergen Reactions  . Latex Anaphylaxis  . Penicillins Hives    Whencombined with sulfa, CAN take penicillin by itself Has patient had a PCN reaction causing immediate rash, facial/tongue/throat swelling, SOB or lightheadedness with hypotension: Yes Has patient had a PCN reaction causing severe rash involving mucus membranes or skin necrosis: No Has patient had a PCN reaction that required hospitalization: No Has patient had a PCN reaction occurring within the last 10 years: No If all of the above answers are "NO", then may proceed with Cephalosporin use.  . 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  This is quite physically exertional at times.  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  OBJCTIVE -PE, EKG, labs   Wt Readings from Last 3 Encounters:  02/08/20 224 lb 9.6 oz (101.9 kg)  10/31/19 220 lb (99.8 kg)  03/01/19 220 lb (99.8 kg)    Physical Exam: BP 114/72   Pulse 74   Ht 6\' 1"  (1.854 m)   Wt 224 lb 9.6 oz (101.9 kg)   SpO2 96%   BMI 29.63 kg/m  Physical Exam Vitals reviewed.  Constitutional:      General: He is not in acute distress.    Appearance: Normal appearance.     Comments: Borderline obese.  Well-groomed.  Healthy-appearing.  HENT:     Head: Normocephalic and atraumatic.  Neck:     Vascular: No carotid bruit or JVD.  Cardiovascular:     Rate and Rhythm: Normal rate and regular rhythm.  No extrasystoles are present.    Chest Wall: PMI is not displaced.     Pulses: Normal pulses.     Heart  sounds: Normal heart sounds. No murmur heard.  No friction rub. No gallop.   Pulmonary:     Effort: Pulmonary effort is normal. No respiratory distress.     Breath sounds: Normal breath sounds.  Musculoskeletal:        General: No swelling. Normal range of motion.     Cervical back: Normal range of motion.  Neurological:     General: No focal deficit present.     Mental Status: He is alert and oriented to person, place, and time.  Psychiatric:        Mood and Affect: Mood normal.  Behavior: Behavior normal.        Thought Content: Thought content normal.        Judgment: Judgment normal.      Adult ECG Report  Rate: 74;  Rhythm: normal sinus rhythm and Cannot rule out anterior MI, age-indeterminate.  Otherwise normal axis, intervals durations.;   Narrative Interpretation: Stable EKG  Recent Labs:    Lab Results  Component Value Date   CHOL 111 03/01/2019   HDL 43 03/01/2019   LDLCALC 50 03/01/2019   TRIG 94 03/01/2019   CHOLHDL 2.6 03/01/2019   Lab Results  Component Value Date   CREATININE 1.03 02/08/2020   BUN 21 02/08/2020   NA 142 02/08/2020   K 4.3 02/08/2020   CL 109 (H) 02/08/2020   CO2 18 (L) 02/08/2020   Lab Results  Component Value Date   TSH 1.750 10/02/2013    ASSESSMENT/PLAN    Problem List Items Addressed This Visit    Essential hypertension (Chronic)    Blood pressure well controlled on current doses of Toprol and amlodipine-benazepril.  No change      PVC's (premature ventricular contractions) (Chronic)    He had symptomatic PVCs in the past, but well controlled on current dose of beta-blocker.    No change      Atherogenic dyslipidemia (Chronic)    Labs were checked in July 2020.  Well-controlled on moderate dose atorvastatin.  Continue.      S/P aortic aneurysm repair (Chronic)    The plan was for him to have a follow-up CT scan in 2020, unfortunately because of COVID-19 it was delayed.  He is now due to get a new baseline  CT angiogram of the chest.  Has not had one done postop.  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  Low threshold to consider treatment, but for now unless there is recurrence I would not anticoagulate. Continue beta-blocker-Toprol 100 mg daily      Relevant Orders   EKG 12-Lead   CT ANGIO CHEST AORTA W/CM & OR WO/CM   Basic metabolic panel (Completed)    Other Visit Diagnoses    Thoracic aortic aneurysm without rupture ([place])       Relevant Orders   EKG 12-Lead   CT ANGIO CHEST AORTA W/CM & OR WO/CM   Basic metabolic panel (Completed)       COVID-19 Education: The signs and symptoms of COVID-19 were discussed with the patient and how to seek care for testing (follow up with PCP or arrange E-visit).   The importance of social distancing and COVID-19 vaccination was discussed today.  I spent a total of 32minutes with the patient. >  50% of the time was spent in direct patient consultation.  Additional time spent with chart review  / charting (studies, outside notes, etc): 7 Total Time: 28 min   Current medicines are reviewed at length with the patient today.  (+/- concerns) n/a  Notice: This dictation was prepared with Dragon dictation along with smaller phrase technology. Any transcriptional errors that result from this process are unintentional and may not be corrected upon review.  Patient Instructions / Medication Changes & Studies & Tests Ordered   Patient Instructions  Medication Instructions:  No changes  *  If you need a refill on your cardiac medications before your next appointment, please call your pharmacy*   Lab Work: BMP == if need for CT of Aorta If you have labs (blood work) drawn today and your tests are completely normal, you will  receive your results only by: Marland Kitchen MyChart Message (if you have MyChart) OR . A paper copy in the mail If you have any lab test that is abnormal or we need to change your treatment, we will call you to review the results.   Testing/Procedures:  schedule a CT angio of Aorta - schedule at [facility]  Non-Cardiac CT scanning, (CAT scanning), is a noninvasive, special x-ray that produces cross-sectional images of the body using x-rays and a computer. CT scans help physicians diagnose and treat medical conditions. For some CT exams, a contrast material is used to enhance visibility in the area of the body being studied. CT scans provide greater clarity and reveal more details than regular x-ray exams.    Follow-Up: At [facility], you and your health needs are our priority.  As part of our continuing mission to provide you with exceptional heart care, we have created designated Provider Care Teams.  These Care Teams include your primary Cardiologist (physician) and Advanced Practice Providers (APPs -  Physician Assistants and Nurse Practitioners) who all work together to provide you with the care you need, when you need it.    Your next appointment:   12 month(s)  The format for your next appointment:   In Person  Provider:   Glenetta Hew, MD   Other Instructions     Studies Ordered:   Orders Placed This Encounter  Procedures  . CT ANGIO CHEST AORTA W/CM & OR WO/CM  . Basic metabolic panel  . EKG 12-Lead     Glenetta Hew, M.D., M.S. Interventional Cardiologist   Pager # [phone] Phone # [phone] [address] [place], [place] [address]   Thank you for choosing Heartcare at [facility]!!

## 2020-02-09 LAB — BASIC METABOLIC PANEL
BUN/Creatinine Ratio: 20 (ref 10–24)
BUN: 21 mg/dL (ref 8–27)
CO2: 18 mmol/L — ABNORMAL LOW (ref 20–29)
Calcium: 8.8 mg/dL (ref 8.6–10.2)
Chloride: 109 mmol/L — ABNORMAL HIGH (ref 96–106)
Creatinine, Ser: 1.03 mg/dL (ref 0.76–1.27)
GFR calc Af Amer: 90 mL/min/{1.73_m2} (ref >59)
GFR calc non Af Amer: 78 mL/min/{1.73_m2} (ref >59)
Glucose: 111 mg/dL — ABNORMAL HIGH (ref 65–99)
Potassium: 4.3 mmol/L (ref 3.5–5.2)
Sodium: 142 mmol/L (ref 134–144)

## 2020-02-10 ENCOUNTER — Encounter: Payer: Self-pay | Admitting: Cardiology

## 2020-02-10 NOTE — Assessment & Plan Note (Signed)
No further breakthrough episodes since surgery.  Low threshold to consider treatment, but for now unless there is recurrence I would not anticoagulate. Continue beta-blocker-Toprol 100 mg daily

## 2020-02-10 NOTE — Assessment & Plan Note (Signed)
Blood pressure well controlled on current doses of Toprol and amlodipine-benazepril.  No change

## 2020-02-10 NOTE — Assessment & Plan Note (Signed)
Labs were checked in July 2020.  Well-controlled on moderate dose atorvastatin.  Continue.

## 2020-02-10 NOTE — Assessment & Plan Note (Signed)
The plan was for him to have a follow-up CT scan in 2020, unfortunately because of COVID-19 it was delayed.  He is now due to get a new baseline CT angiogram of the chest.  Has not had one done postop.  Plan: Order CT Angiogram Chest Aorta

## 2020-02-10 NOTE — Assessment & Plan Note (Signed)
He had symptomatic PVCs in the past, but well controlled on current dose of beta-blocker.    No change

## 2020-02-19 ENCOUNTER — Other Ambulatory Visit: Payer: Self-pay

## 2020-02-19 ENCOUNTER — Ambulatory Visit (HOSPITAL_COMMUNITY)
Admission: RE | Admit: 2020-02-19 | Discharge: 2020-02-19 | Disposition: A | Discharge status: Home / Self Care | Payer: 59 | Source: Ambulatory Visit | Attending: Cardiology | Admitting: Cardiology

## 2020-02-19 DIAGNOSIS — I712 Thoracic aortic aneurysm, without rupture, unspecified: Secondary | ICD-10-CM

## 2020-02-19 DIAGNOSIS — Z8679 Personal history of other diseases of the circulatory system: Secondary | ICD-10-CM

## 2020-02-19 DIAGNOSIS — Z9889 Other specified postprocedural states: Secondary | ICD-10-CM | POA: Diagnosis not present

## 2020-02-19 DIAGNOSIS — I9789 Other postprocedural complications and disorders of the circulatory system, not elsewhere classified: Secondary | ICD-10-CM | POA: Diagnosis not present

## 2020-02-19 DIAGNOSIS — I4891 Unspecified atrial fibrillation: Secondary | ICD-10-CM

## 2020-02-19 DIAGNOSIS — I2510 Atherosclerotic heart disease of native coronary artery without angina pectoris: Secondary | ICD-10-CM | POA: Diagnosis not present

## 2020-02-19 MED ORDER — IOHEXOL 350 MG/ML SOLN
100.0000 mL | Freq: Once | INTRAVENOUS | Status: AC | PRN
  Administered 2020-02-19: 100 mL via INTRAVENOUS

## 2020-04-14 ENCOUNTER — Other Ambulatory Visit: Payer: Self-pay | Admitting: Cardiology

## 2020-04-14 ENCOUNTER — Other Ambulatory Visit: Payer: Self-pay | Admitting: Family Medicine

## 2020-04-14 MED FILL — ATORVASTATIN 20 MG TABLET: 20 | 90 days supply | Qty: 90 | Fill #0

## 2020-04-15 MED FILL — METOPROLOL SUCCINATE ER 100: 100 | 30 days supply | Qty: 30 | Fill #0

## 2020-04-15 MED FILL — AMLODIPINE-BENAZEPRIL 5-20: 5-20 | 30 days supply | Qty: 30 | Fill #0

## 2020-06-12 ENCOUNTER — Ambulatory Visit (INDEPENDENT_AMBULATORY_CARE_PROVIDER_SITE_OTHER): Payer: 59 | Admitting: Family Medicine

## 2020-06-12 ENCOUNTER — Other Ambulatory Visit: Payer: Self-pay

## 2020-06-12 ENCOUNTER — Other Ambulatory Visit: Payer: Self-pay | Admitting: Family Medicine

## 2020-06-12 VITALS — BP 128/80 | HR 80 | Temp 97.7°F | Ht 73.0 in | Wt 222.0 lb

## 2020-06-12 DIAGNOSIS — Z125 Encounter for screening for malignant neoplasm of prostate: Secondary | ICD-10-CM

## 2020-06-12 DIAGNOSIS — I10 Essential (primary) hypertension: Secondary | ICD-10-CM | POA: Diagnosis not present

## 2020-06-12 DIAGNOSIS — E7800 Pure hypercholesterolemia, unspecified: Secondary | ICD-10-CM | POA: Diagnosis not present

## 2020-06-12 MED ORDER — AMLODIPINE BESY-BENAZEPRIL HCL 5-20 MG PO CAPS
1.0000 | ORAL_CAPSULE | Freq: Every day | ORAL | 3 refills | Status: DC
Start: 2020-06-12 — End: 2020-06-12

## 2020-06-12 MED ORDER — ATORVASTATIN CALCIUM 20 MG PO TABS
20.0000 mg | ORAL_TABLET | Freq: Every day | ORAL | 3 refills | Status: DC
Start: 2020-06-12 — End: 2020-06-12

## 2020-06-12 MED ORDER — METOPROLOL SUCCINATE ER 100 MG PO TB24
100.0000 mg | ORAL_TABLET | Freq: Every day | ORAL | 3 refills | Status: DC
Start: 2020-06-12 — End: 2020-06-12

## 2020-06-12 MED ORDER — AMLODIPINE BESY-BENAZEPRIL HCL 5-20 MG PO CAPS: 1.0000 | ORAL_CAPSULE | Freq: Every day | ORAL | 3 refills | Status: DC

## 2020-06-12 MED FILL — AMLODIPINE-BENAZEPRIL 5-20: 5-20 | 90 days supply | Qty: 90 | Fill #0

## 2020-06-12 MED FILL — METOPROLOL SUCCINATE ER 100: 100 | 90 days supply | Qty: 90 | Fill #0

## 2020-06-12 NOTE — Progress Notes (Signed)
Subjective:    Patient ID: Lawrence Liu, male    DOB: 1955-10-13, 64 y.o.   MRN: [id number]  HPI  Patient is a very pleasant 64 year old Caucasian male here today for a checkup.  Is been more than a year since seen when checked a PSA to screen for prostate cancer.  He is on Lotrel for hypertension and his blood pressure today is outstanding.  He does have some trace bipedal edema but this does not bother him.  He states that the swelling goes down every morning.  He certainly does not want to switch blood pressure medicine based on it.  He denies any chest pain or shortness of breath.  He is also on Lipitor for hyperlipidemia.  He denies any myalgias or right upper quadrant pain.  He is overdue for fasting lab work to monitor his liver function test and renal function test as well as his cholesterol. Past medical history is significant for aortic aneurysm repair in 2019.  He also has a history of nonobstructive coronary artery disease as listed below.  Past Medical History:  Diagnosis Date  . Alpha galactosidase deficiency    multiple tick bites  . Deep vein thrombosis ([place]) 2004   Left deep vein thrombosis of left calf  . GERD (gastroesophageal reflux disease)   . History of blood transfusion    as a baby for jaundice  . Hypertension   . 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 No lesions greater than 30% noted. - confirmed by Cor CTA  . Pneumonia 1980's   walking pneumonia  . Status post ascending aortic aneurysm repair 08/2016   Dr. Cyndia Bent  . Thoracic ascending aortic aneurysm ([place]) 11/06/2013   CTA of chest November 2017 showed notable increase in size --> s/p Open (Valve Sparing) Repair Jan 2019)   Past Surgical History:  Procedure Laterality Date  . ASCENDING AORTIC ROOT REPLACEMENT N/A 08/15/2017   Procedure: ASCENDING AORTIC ROOT REPLACEMENT, (VALVE SPARING ROOT REPLACEMENT);  Surgeon: [name], MD;  Location: [facility];  Service:  Open Heart Surgery;  Laterality: N/A;  . CHOLECYSTECTOMY N/A 07/24/2018   Procedure: LAPAROSCOPIC CHOLECYSTECTOMY WITH INTRAOPERATIVE CHOLANGIOGRAM;  Surgeon: Alphonsa Overall, MD;  Location: [facility];  Service: General;  Laterality: N/A;  . COLONOSCOPY WITH PROPOFOL N/A 11/23/2016   Procedure: COLONOSCOPY WITH PROPOFOL;  Surgeon: Juanita Craver, MD;  Location: [facility];  Service: Endoscopy;  Laterality: N/A;  . colonscopy  5 1/2 yrs ago   polyps removed  . EYE SURGERY Bilateral   . LEFT HEART CATHETERIZATION WITH CORONARY ANGIOGRAM N/A 10/03/2013   Procedure: LEFT HEART CATHETERIZATION WITH CORONARY ANGIOGRAM;  Surgeon: Jolaine Artist, MD;  Location: [facility] CATH LAB;  Service: Cardiovascular:  Nonobstructive disease - LAD diffuse 20-30%. Small distal vessel. Circumflex ostial 30-40%. Large OM 1 with 30% mid. Small OM 2 and several small PL branches. Large dominant RCA with 30% mid and diffuse 20-30% distal.  . SHOULDER ARTHROSCOPY  07/15/2011   Procedure: ARTHROSCOPY SHOULDER;  Surgeon: Metta Clines Supple;  Location: [facility];  Service: Orthopedics;  Laterality: Right;  RIGHT ARTHROSCOPY SUBACROMIAL DECOMPRESSION AND DISTAL CLAVICLE RESECTION  . TEE WITHOUT CARDIOVERSION N/A 08/15/2017   Procedure: TRANSESOPHAGEAL ECHOCARDIOGRAM (TEE);  Surgeon: [name], MD;  Location: [facility];  Service: Open Heart Surgery;  Laterality: N/A;  . TONSILLECTOMY  ~ 1970   adenoids also  . TRANSTHORACIC ECHOCARDIOGRAM  March 2015   Upper limit size LV. Mild LVH. EF 60%.  Normal wall motion. Ascending aorta measures 48 mm at root tapering to 40 mm at proximal ascending. Mild RV dilation.   Current Outpatient Medications on File Prior to Visit  Medication Sig Dispense Refill  . amLODipine-benazepril (LOTREL) 5-20 MG capsule TAKE 1 CAPSULE BY MOUTH DAILY. 30 capsule 0  . Ascorbic Acid (VITAMIN C) 1000 MG tablet Take 1,000 mg by mouth daily.    Marland Kitchen aspirin 81 MG tablet Take 4 tablets (325 mg total) by mouth daily. (Patient taking  differently: Take 81 mg by mouth daily. )    . atorvastatin (LIPITOR) 20 MG tablet TAKE 1 TABLET (20 MG TOTAL) BY MOUTH DAILY. 90 tablet 3  . Cholecalciferol (VITAMIN D3) 5000 units CAPS Take 5,000 Units by mouth daily.    . Coenzyme Q10 (CO Q-10) 300 MG CAPS Take 300 mg by mouth daily.    . Cyanocobalamin (VITAMIN B-12) 5000 MCG TBDP Take 5,000 mcg by mouth daily.     Marland Kitchen EPINEPHrine (EPIPEN 2-PAK) 0.3 mg/0.3 mL IJ SOAJ injection Inject 0.3 mLs (0.3 mg total) into the muscle as needed for anaphylaxis (for severe allergic reaction with difficulty breathing, tongue or lip swelling, throat tightness). 1 each 0  . famotidine (PEPCID) 20 MG tablet Take 20 mg by mouth daily.    . fexofenadine (ALLEGRA) 180 MG tablet Take 180 mg by mouth daily.    . metoprolol succinate (TOPROL-XL) 100 MG 24 hr tablet TAKE 1 TABLET (100 MG TOTAL) BY MOUTH DAILY. 30 tablet 0  . Probiotic Product (PROBIOTIC PO) Take 1 tablet by mouth daily. PROBIOTICS 10    . vitamin E 400 UNIT capsule Take 800 Units by mouth daily.      No current facility-administered medications on file prior to visit.   Allergies  Allergen Reactions  . Latex Anaphylaxis  . Penicillins Hives    Whencombined with sulfa, CAN take penicillin by itself Has patient had a PCN reaction causing immediate rash, facial/tongue/throat swelling, SOB or lightheadedness with hypotension: Yes Has patient had a PCN reaction causing severe rash involving mucus membranes or skin necrosis: No Has patient had a PCN reaction that required hospitalization: No Has patient had a PCN reaction occurring within the last 10 years: No If all of the above answers are "NO", then may proceed with Cephalosporin use.  . Sulfa Antibiotics Hives    Only when mixed sulfa and pencillin together   Social History   Socioeconomic History  . Marital status: Married    Spouse name: Not on file  . Number of children: Not on file  . Years of education: Not on file  . Highest education  level: Not on file  Occupational History  . Not on file  Tobacco Use  . Smoking status: Never Smoker  . Smokeless tobacco: Never Used  Vaping Use  . Vaping Use: Never used  Substance and Sexual Activity  . Alcohol use: Yes    Comment: 10/02/2013 "hard apple cider maybe once/month"  . Drug use: No  . Sexual activity: Yes  Other Topics Concern  . Not on file  Social History Narrative  . Not on file   Social Determinants of Health   Financial Resource Strain:   . Difficulty of Paying Living Expenses: Not on file  Food Insecurity:   . Worried About Charity fundraiser in the Last Year: Not on file  . Ran Out of Food in the Last Year: Not on file  Transportation Needs:   . Lack of Transportation (Medical):  Not on file  . Lack of Transportation (Non-Medical): Not on file  Physical Activity:   . Days of Exercise per Week: Not on file  . Minutes of Exercise per Session: Not on file  Stress:   . Feeling of Stress : Not on file  Social Connections:   . Frequency of Communication with Friends and Family: Not on file  . Frequency of Social Gatherings with Friends and Family: Not on file  . Attends Religious Services: Not on file  . Active Member of Clubs or Organizations: Not on file  . Attends Archivist Meetings: Not on file  . Marital Status: Not on file  Intimate Partner Violence:   . Fear of Current or Ex-Partner: Not on file  . Emotionally Abused: Not on file  . Physically Abused: Not on file  . Sexually Abused: Not on file   Family History  Problem Relation Age of Onset  . Heart disease Father   . Diabetes Father   . Diabetes Mother       Review of Systems  All other systems reviewed and are negative.      Objective:   Physical Exam Vitals reviewed.  Constitutional:      General: He is not in acute distress.    Appearance: Normal appearance. He is normal weight. He is not ill-appearing, toxic-appearing or diaphoretic.  HENT:     Head:  Normocephalic and atraumatic.     Right Ear: Tympanic membrane, ear canal and external ear normal. There is no impacted cerumen.     Left Ear: Tympanic membrane, ear canal and external ear normal. There is no impacted cerumen.     Nose: Nose normal. No congestion or rhinorrhea.     Mouth/Throat:     Mouth: Mucous membranes are moist.     Pharynx: Oropharynx is clear. No oropharyngeal exudate or posterior oropharyngeal erythema.  Eyes:     General: No scleral icterus.       Right eye: No discharge.        Left eye: No discharge.     Extraocular Movements: Extraocular movements intact.     Conjunctiva/sclera: Conjunctivae normal.     Pupils: Pupils are equal, round, and reactive to light.  Neck:     Vascular: No carotid bruit.  Cardiovascular:     Rate and Rhythm: Normal rate and regular rhythm.     Pulses: Normal pulses.     Heart sounds: Normal heart sounds. No murmur heard.  No friction rub. No gallop.   Pulmonary:     Effort: Pulmonary effort is normal. No respiratory distress.     Breath sounds: Normal breath sounds. No stridor. No wheezing, rhonchi or rales.  Chest:     Chest wall: No tenderness.  Abdominal:     General: Bowel sounds are normal. There is no distension.     Palpations: Abdomen is soft. There is no mass.     Tenderness: There is no abdominal tenderness. There is no right CVA tenderness, left CVA tenderness, guarding or rebound.     Hernia: No hernia is present.  Genitourinary:    Prostate: Normal.     Rectum: Normal.  Musculoskeletal:     Cervical back: Normal range of motion and neck supple. No rigidity. No muscular tenderness.     Right lower leg: No edema.     Left lower leg: No edema.  Lymphadenopathy:     Cervical: No cervical adenopathy.  Skin:    General: Skin is  warm.     Coloration: Skin is not jaundiced or pale.     Findings: No bruising, erythema, lesion or rash.  Neurological:     General: No focal deficit present.     Mental Status: He is  alert and oriented to person, place, and time. Mental status is at baseline.     Cranial Nerves: No cranial nerve deficit.     Sensory: No sensory deficit.     Motor: No weakness.     Coordination: Coordination normal.     Gait: Gait normal.     Deep Tendon Reflexes: Reflexes normal.  Psychiatric:        Mood and Affect: Mood normal.        Behavior: Behavior normal.        Thought Content: Thought content normal.        Judgment: Judgment normal.           Assessment & Plan:  Essential hypertension - Plan: CBC with Differential/Platelet, COMPLETE METABOLIC PANEL WITH GFR, Lipid panel  Pure hypercholesterolemia - Plan: CBC with Differential/Platelet, COMPLETE METABOLIC PANEL WITH GFR, Lipid panel  Prostate cancer screening - Plan: PSA  Blood pressure today is outstanding.  Make no changes in his Lotrel.  Check CMP, CBC, fasting lipid panel.  Goal LDL cholesterol be less than 70.  Blood pressure is at goal.  While we are checking his lab work I will also check a PSA to screen for prostate cancer.  Patient is already had his Covid vaccine.  He is also already had his flu shot.  Otherwise his preventative care is up-to-date.  Await the results of his lab work.

## 2020-06-13 LAB — CBC WITH DIFFERENTIAL/PLATELET
Absolute Monocytes: 431 cells/uL (ref 200–950)
Basophils Absolute: 18 cells/uL (ref 0–200)
Basophils Relative: 0.3 %
Eosinophils Absolute: 142 cells/uL (ref 15–500)
Eosinophils Relative: 2.4 %
HCT: 40.2 % (ref 38.5–50.0)
Hemoglobin: 13.6 g/dL (ref 13.2–17.1)
Lymphs Abs: 2006 cells/uL (ref 850–3900)
MCH: 27.1 pg (ref 27.0–33.0)
MCHC: 33.8 g/dL (ref 32.0–36.0)
MCV: 80.2 fL (ref 80.0–100.0)
MPV: 9.9 fL (ref 7.5–12.5)
Monocytes Relative: 7.3 %
Neutro Abs: 3304 cells/uL (ref 1500–7800)
Neutrophils Relative %: 56 %
Platelets: 199 10*3/uL (ref 140–400)
RBC: 5.01 10*6/uL (ref 4.20–5.80)
RDW: 13.2 % (ref 11.0–15.0)
Total Lymphocyte: 34 %
WBC: 5.9 10*3/uL (ref 3.8–10.8)

## 2020-06-13 LAB — COMPLETE METABOLIC PANEL WITH GFR
AG Ratio: 2 (calc) (ref 1.0–2.5)
ALT: 26 U/L (ref 9–46)
AST: 21 U/L (ref 10–35)
Albumin: 4.1 g/dL (ref 3.6–5.1)
Alkaline phosphatase (APISO): 64 U/L (ref 35–144)
BUN: 22 mg/dL (ref 7–25)
CO2: 25 mmol/L (ref 20–32)
Calcium: 8.8 mg/dL (ref 8.6–10.3)
Chloride: 108 mmol/L (ref 98–110)
Creat: 0.93 mg/dL (ref 0.70–1.25)
GFR, Est African American: 102 mL/min/{1.73_m2} (ref >=60)
GFR, Est Non African American: 88 mL/min/{1.73_m2} (ref >=60)
Globulin: 2.1 g/dL (calc) (ref 1.9–3.7)
Glucose, Bld: 96 mg/dL (ref 65–99)
Potassium: 4 mmol/L (ref 3.5–5.3)
Sodium: 141 mmol/L (ref 135–146)
Total Bilirubin: 0.7 mg/dL (ref 0.2–1.2)
Total Protein: 6.2 g/dL (ref 6.1–8.1)

## 2020-06-13 LAB — LIPID PANEL
Cholesterol: 124 mg/dL (ref <200)
HDL: 42 mg/dL (ref >=40)
LDL Cholesterol (Calc): 64 mg/dL (calc)
Non-HDL Cholesterol (Calc): 82 mg/dL (calc) (ref <130)
Total CHOL/HDL Ratio: 3 (calc) (ref <5.0)
Triglycerides: 97 mg/dL (ref <150)

## 2020-06-13 LAB — PSA: PSA: 1.89 ng/mL (ref <=4.0)

## 2020-08-13 MED FILL — ATORVASTATIN CALCIUM 20 MG: 20 | 90 days supply | Qty: 90 | Fill #1

## 2020-08-22 MED FILL — ATORVASTATIN CALCIUM 20 MG: 20 | 90 days supply | Qty: 90 | Fill #0

## 2020-09-24 MED FILL — AMLODIPINE-BENAZEPRIL 5-20: 5-20 | 90 days supply | Qty: 90 | Fill #1

## 2020-09-24 MED FILL — METOPROLOL SUCCINATE ER 100: 100 | 90 days supply | Qty: 90 | Fill #1

## 2021-01-16 ENCOUNTER — Other Ambulatory Visit (HOSPITAL_COMMUNITY): Payer: Self-pay

## 2021-01-16 MED FILL — Metoprolol Succinate Tab ER 24HR 100 MG (Tartrate Equiv): ORAL | 90 days supply | Qty: 90 | Fill #0 | Status: AC

## 2021-01-16 MED FILL — Amlodipine Besylate-Benazepril HCl Cap 5-20 MG: ORAL | 90 days supply | Qty: 90 | Fill #0 | Status: AC

## 2021-01-16 MED FILL — Atorvastatin Calcium Tab 20 MG (Base Equivalent): ORAL | 90 days supply | Qty: 90 | Fill #0 | Status: AC

## 2021-04-29 DIAGNOSIS — M25512 Pain in left shoulder: Secondary | ICD-10-CM | POA: Diagnosis not present

## 2021-05-03 ENCOUNTER — Encounter: Payer: Self-pay | Admitting: Cardiology

## 2021-05-03 NOTE — Progress Notes (Signed)
Primary Care Provider: [name], MD Cardiologist: Glenetta Hew, MD Electrophysiologist: None  Clinic Note: Chief Complaint  Patient presents with   Follow-up    Annual.   Thoracic Aortic Aneurysm    Status post aortic root replacement/valve sparing; had CT scan done after last visit.   ===================================  ASSESSMENT/PLAN   Problem List Items Addressed This Visit       Cardiology Problems   Essential hypertension (Chronic)    Blood pressure borderline today on Toprol and amlodipine-benazepril.  Monitor closely.  He does have room to increase amlodipine if necessary.      Relevant Medications   aspirin 81 MG EC tablet   PVC's (premature ventricular contractions) (Chronic)    Very rare symptomatic PVCs.  He notices them, but they are not occurring with enough frequency to be  worrisome.  Continue current dose of beta-blocker.      Relevant Medications   aspirin 81 MG EC tablet   Other Relevant Orders   EKG 12-Lead   Postoperative atrial fibrillation (HCC) (Chronic)    No further breakthrough spells.  He is on Toprol 100 mgdaily.  With no recurrent spells since his operation, we have not placed him on DOAC.      Relevant Medications   aspirin 81 MG EC tablet   Other Relevant Orders   EKG 12-Lead     Other   Atherogenic dyslipidemia (Chronic)    Remains on atorvastatin 20 mg daily.  Labs as of November 2021 showed well-controlled lipids with LDL of 64.  Total cholesterol 124.  Due to get repeat labs at PCPs office this November.        S/P aortic aneurysm repair - Primary (Chronic)    CT scan from last year reviewed.  Stable findings postop. Continue blood pressure and lipid control for risk factors.  With planned recheck in likely 2024.  Can order at the next visit to be done prior to follow-up.       ===================================  HPI:    MIKI BLANK is a 65 y.o. male with a PMH notable FOR VALVE SPARING  ASCENDING AORTIC ANEURYSM REPAIR (Dr. Cyndia Bent, 95/1884 ) complicated by Postop A. fib who presents today for delayed annual follow-up.  ASCENDING AORTIC ANEURYSM: Sept 2018: CTA CHEST - with Coronary CTA  Jan 2019 - : ASCENDING AORTIC ROOT REPLACEMENT, (VALVE SPARING ROOT REPLACEMENT);  [name], MD;  [name] was last seen back in July 2021 - doing well.  CTA Chest ordered  Recent Hospitalizations: None  Reviewed  CV studies:    The following studies were reviewed today: (if available, images/films reviewed: From Epic Chart or Care Everywhere) CTA Aorta: 1. Interval repair of the ascending thoracic aortic aneurysm. No evidence of recurrence or residual aneurysm. No evidence of dissection. 2. No acute intrathoracic process. 3. Stable coronary artery atherosclerosis.   Interval History:   OLAMIDE CARATTINI returns today overall doing well.  Stable.  Rare palpitations but nothing prolonged nothing more than a couple seconds.  Certainly nothing "like his A. fib ".  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  He works the [employer] PM shift which allows him time to get up in the morning to walk the dog least a mile or so every morning.  He has no major complaints.  Doing well.  No elevated blood pressures.  No prolonged palpitations.  No chest pain or pressure.  CV Review of Symptoms (  Summary) Cardiovascular ROS: positive for - palpitations and these are rare/ short lived negative for - chest pain, dyspnea on exertion, edema, irregular heartbeat, orthopnea, paroxysmal nocturnal dyspnea, rapid heart rate, shortness of breath, or lightheadedness, syncope/near syncope, TIA/amaurosis fugax, claudication  REVIEWED OF SYSTEMS   Review of Systems  Constitutional:  Negative for chills, fever, malaise/fatigue and weight loss.  HENT:  Negative for nosebleeds.   Respiratory:  Negative for cough and shortness of breath.   Cardiovascular:  Negative for  claudication.  Gastrointestinal:  Negative for abdominal pain, blood in stool and melena.  Genitourinary:  Negative for hematuria.  Musculoskeletal:  Negative for joint pain.  Neurological:  Negative for dizziness, weakness and headaches.  Psychiatric/Behavioral:  Negative for depression and memory loss. The patient is not nervous/anxious and does not have insomnia.    I have reviewed and (if needed) personally updated the patient's problem list, medications, allergies, past medical and surgical history, social and family history.   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 No lesions greater than 30% noted. - confirmed by Cor CTA 12/2016: Cor Ca++ Score 153. Normal Coronary Origings. R Dominant.  Mild Non-obstructive Plaque in RCA, LAD & OM1 --> Rec Med Rx of CRFs   Pneumonia 1980's   walking pneumonia   Status post ascending aortic aneurysm repair 08/2016   Dr. Cyndia Bent   Thoracic ascending aortic aneurysm 11/06/2013   CTA of chest November 2017 showed notable increase in size --> s/p Open (Valve Sparing) Repair Jan 2019)    PAST SURGICAL HISTORY   Past Surgical History:  Procedure Laterality Date   ASCENDING AORTIC ROOT REPLACEMENT N/A 08/15/2017   Procedure: ASCENDING AORTIC ROOT REPLACEMENT, (VALVE SPARING ROOT REPLACEMENT);  Surgeon: [name], MD;  Location: [facility];  Service: Open Heart Surgery;  Laterality: N/A;   CHOLECYSTECTOMY N/A 07/24/2018   Procedure: LAPAROSCOPIC CHOLECYSTECTOMY WITH INTRAOPERATIVE CHOLANGIOGRAM;  Surgeon: Alphonsa Overall, MD;  Location: [facility];  Service: General;  Laterality: N/A;   COLONOSCOPY WITH  PROPOFOL N/A 11/23/2016   Procedure: COLONOSCOPY WITH PROPOFOL;  Surgeon: Juanita Craver, MD;  Location: [facility];  Service: Endoscopy;  Laterality: N/A;   colonscopy  5 1/2 yrs ago   polyps removed   EYE SURGERY Bilateral    LEFT HEART CATHETERIZATION WITH CORONARY ANGIOGRAM N/A 10/03/2013   Procedure: LEFT HEART CATHETERIZATION WITH CORONARY ANGIOGRAM;  Surgeon: Jolaine Artist, MD;  Location: [facility] C[facility];  Service: Cardiovascular:  Nonobstructive disease - LAD diffuse 20-30%. Small distal vessel. Circumflex ostial 30-40%. Large OM 1 with 30% mid. Small OM 2 and several small PL branches. Large dominant RCA with 30% mid and diffuse 20-30% distal.   SHOULDER ARTHROSCOPY  07/15/2011   Procedure: ARTHROSCOPY SHOULDER;  Surgeon: Metta Clines Supple;  Location: [facility];  Service: Orthopedics;  Laterality: Right;  RIGHT ARTHROSCOPY SUBACROMIAL DECOMPRESSION AND DISTAL CLAVICLE RESECTION   TEE WITHOUT CARDIOVERSION N/A 08/15/2017   Procedure: TRANSESOPHAGEAL ECHOCARDIOGRAM (TEE);  Surgeon: [name], MD;  Location: [facility];  Service: Open Heart Surgery;  Laterality: N/A;   TONSILLECTOMY  ~ 1970   adenoids also  TRANSTHORACIC ECHOCARDIOGRAM  March 2015   Upper limit size LV. Mild LVH. EF 60%. Normal wall motion. Ascending aorta measures 48 mm at root tapering to 40 mm at proximal ascending. Mild RV dilation.    Immunization History  Administered Date(s) Administered   Influenza-Unspecified 04/02/2013, 05/30/2015, 04/16/2017   Pneumococcal Polysaccharide-23 08/23/2017   Tdap 08/03/2007, 03/01/2019    MEDICATIONS/ALLERGIES   Current Meds  Medication Sig   amLODipine-benazepril (LOTREL) 5-20 MG capsule TAKE 1 CAPSULE BY MOUTH DAILY.   Ascorbic Acid (VITAMIN C) 1000 MG tablet Take 1,000 mg by mouth daily.   aspirin 81 MG EC tablet Take 81 mg by mouth daily. Swallow whole.   atorvastatin (LIPITOR) 20 MG tablet TAKE 1 TABLET (20 MG TOTAL) BY MOUTH DAILY.   Cholecalciferol (VITAMIN D3) 5000 units  CAPS Take 5,000 Units by mouth daily.   Coenzyme Q10 (CO Q-10) 300 MG CAPS Take 300 mg by mouth daily.   Cyanocobalamin (VITAMIN B-12) 5000 MCG TBDP Take 5,000 mcg by mouth daily.    EPINEPHrine (EPIPEN 2-PAK) 0.3 mg/0.3 mL IJ SOAJ injection Inject 0.3 mLs (0.3 mg total) into the muscle as needed for anaphylaxis (for severe allergic reaction with difficulty breathing, tongue or lip swelling, throat tightness).   famotidine (PEPCID) 20 MG tablet Take 20 mg by mouth daily.   fexofenadine (ALLEGRA) 180 MG tablet Take 180 mg by mouth daily.   metoprolol succinate (TOPROL-XL) 100 MG 24 hr tablet TAKE 1 TABLET (100 MG TOTAL) BY MOUTH DAILY.   Probiotic Product (PROBIOTIC PO) Take 1 tablet by mouth daily. PROBIOTICS 10   vitamin E 400 UNIT capsule Take 800 Units by mouth daily.     Allergies  Allergen Reactions   Latex Anaphylaxis   Penicillins Hives    Whencombined with sulfa, CAN take penicillin by itself Has patient had a PCN reaction causing immediate rash, facial/tongue/throat swelling, SOB or lightheadedness with hypotension: Yes Has patient had a PCN reaction causing severe rash involving mucus membranes or skin necrosis: No Has patient had a PCN reaction that required hospitalization: No Has patient had a PCN reaction occurring within the last 10 years: No If all of the above answers are "NO", then may proceed with Cephalosporin use.   Sulfa Antibiotics Hives    Only when mixed sulfa and pencillin together    SOCIAL HISTORY/FAMILY HISTORY   Reviewed in Epic:  Pertinent findings:  Social History   Tobacco Use   Smoking status: Never   Smokeless tobacco: Never  Vaping Use   Vaping Use: Never used  Substance Use Topics   Alcohol use: Yes    Comment: 10/02/2013 "hard apple cider maybe once/month"   Drug use: No   Social History   Social History Narrative   Not on file    OBJCTIVE -PE, EKG, labs   Wt Readings from Last 3 Encounters:  05/04/21 225 lb 3.2 oz (102.2 kg)   06/12/20 222 lb (100.7 kg)  02/08/20 224 lb 9.6 oz (101.9 kg)    Physical Exam: BP 134/80 (BP Location: Right Arm, Patient Position: Sitting, Cuff Size: Normal)   Pulse 63   Ht 6\' 1"  (1.854 m)   Wt 225 lb 3.2 oz (102.2 kg)   SpO2 97%   BMI 29.71 kg/m  Physical Exam Vitals reviewed.  Constitutional:      General: He is not in acute distress.    Appearance: Normal appearance. He is obese. He is not ill-appearing or toxic-appearing.     Comments: Well nourished &  well groomed.  HENT:     Head: Normocephalic and atraumatic.  Cardiovascular:     Rate and Rhythm: Normal rate and regular rhythm.     Pulses: Normal pulses.     Heart sounds: Normal heart sounds. No murmur heard.   No friction rub. No gallop.  Pulmonary:     Effort: Pulmonary effort is normal. No respiratory distress.     Breath sounds: Normal breath sounds.  Chest:     Chest wall: No tenderness.  Musculoskeletal:        General: No swelling. Normal range of motion.     Cervical back: Normal range of motion and neck supple.  Skin:    General: Skin is warm and dry.  Neurological:     General: No focal deficit present.     Mental Status: He is alert and oriented to person, place, and time.     Cranial Nerves: No cranial nerve deficit.     Gait: Gait normal.  Psychiatric:        Mood and Affect: Mood normal.        Behavior: Behavior normal.        Thought Content: Thought content normal.        Judgment: Judgment normal.     Adult ECG Report  Rate: 63 ;  Rhythm: normal sinus rhythm and Nonspecific ST and T wave changes (stable ST depressions in lateral leads-consider ischemia) normal axis, intervals and durations.;   Narrative Interpretation: Stable EKG  Recent Labs: Reviewed.  Due for follow-up labs in November Lab Results  Component Value Date   CHOL 124 06/12/2020   HDL 42 06/12/2020   LDLCALC 64 06/12/2020   TRIG 97 06/12/2020   CHOLHDL 3.0 06/12/2020   Lab Results  Component Value Date    CREATININE 0.93 06/12/2020   BUN 22 06/12/2020   NA 141 06/12/2020   K 4.0 06/12/2020   CL 108 06/12/2020   CO2 25 06/12/2020   CBC Latest Ref Rng & Units 06/12/2020 10/31/2019 03/01/2019  WBC 3.8 - 10.8 Thousand/uL 5.9 10.1 6.3  Hemoglobin 13.2 - 17.1 g/dL 13.6 14.0 14.0  Hematocrit 38.5 - 50.0 % 40.2 43.1 42.7  Platelets 140 - 400 Thousand/uL 199 260 207    Lab Results  Component Value Date   HGBA1C 5.8 (H) 08/12/2017   Lab Results  Component Value Date   TSH 1.750 10/02/2013    ==================================================  COVID-19 Education: The signs and symptoms of COVID-19 were discussed with the patient and how to seek care for testing (follow up with PCP or arrange E-visit).    I spent a total of 18 minutes with the patient spent in direct patient consultation.  Additional time spent with chart review  / charting (studies, outside notes, etc): 13 min Total Time: 31 min  Current medicines are reviewed at length with the patient today.  (+/- concerns) none  This visit occurred during the SARS-CoV-2 public health emergency.  Safety protocols were in place, including screening questions prior to the visit, additional usage of staff PPE, and extensive cleaning of exam room while observing appropriate contact time as indicated for disinfecting solutions.  Notice: This dictation was prepared with Dragon dictation along with smart phrase technology. Any transcriptional errors that result from this process are unintentional and may not be corrected upon review.  Patient Instructions / Medication Changes & Studies & Tests Ordered   Patient Instructions  Medication Instructions:  No changes   *If you need a refill  on your cardiac medications before your next appointment, please call your pharmacy*   Lab Work:  Not needed   Testing/Procedures:  Not needed  Follow-Up: At [facility], you and your health needs are our priority.  As part of our continuing  mission to provide you with exceptional heart care, we have created designated Provider Care Teams.  These Care Teams include your primary Cardiologist (physician) and Advanced Practice Providers (APPs -  Physician Assistants and Nurse Practitioners) who all work together to provide you with the care you need, when you need it.     Your next appointment:   12 month(s)  The format for your next appointment:   In Person  Provider:   Glenetta Hew, MD     Studies Ordered:   Orders Placed This Encounter  Procedures   EKG 12-Lead      Glenetta Hew, M.D., M.S. Interventional Cardiologist   Pager # [phone] Phone # [phone] [address] [place], [place] [address]   Thank you for choosing Heartcare at [facility]!!

## 2021-05-04 ENCOUNTER — Other Ambulatory Visit: Payer: Self-pay

## 2021-05-04 ENCOUNTER — Ambulatory Visit (INDEPENDENT_AMBULATORY_CARE_PROVIDER_SITE_OTHER): Payer: 59 | Admitting: Cardiology

## 2021-05-04 ENCOUNTER — Encounter: Payer: Self-pay | Admitting: Cardiology

## 2021-05-04 VITALS — BP 134/80 | HR 63 | Ht 73.0 in | Wt 225.2 lb

## 2021-05-04 DIAGNOSIS — I4891 Unspecified atrial fibrillation: Secondary | ICD-10-CM | POA: Diagnosis not present

## 2021-05-04 DIAGNOSIS — I10 Essential (primary) hypertension: Secondary | ICD-10-CM

## 2021-05-04 DIAGNOSIS — I493 Ventricular premature depolarization: Secondary | ICD-10-CM

## 2021-05-04 DIAGNOSIS — I9789 Other postprocedural complications and disorders of the circulatory system, not elsewhere classified: Secondary | ICD-10-CM | POA: Diagnosis not present

## 2021-05-04 DIAGNOSIS — Z9889 Other specified postprocedural states: Secondary | ICD-10-CM

## 2021-05-04 DIAGNOSIS — Z8679 Personal history of other diseases of the circulatory system: Secondary | ICD-10-CM

## 2021-05-04 DIAGNOSIS — Z86718 Personal history of other venous thrombosis and embolism: Secondary | ICD-10-CM

## 2021-05-04 DIAGNOSIS — E785 Hyperlipidemia, unspecified: Secondary | ICD-10-CM | POA: Diagnosis not present

## 2021-05-04 NOTE — Assessment & Plan Note (Signed)
Very rare symptomatic PVCs.  He notices them, but they are not occurring with enough frequency to be  worrisome.  Continue current dose of beta-blocker.

## 2021-05-04 NOTE — Assessment & Plan Note (Signed)
Blood pressure borderline today on Toprol and amlodipine-benazepril.  Monitor closely.  He does have room to increase amlodipine if necessary.

## 2021-05-04 NOTE — Assessment & Plan Note (Signed)
Remains on atorvastatin 20 mg daily.  Labs as of November 2021 showed well-controlled lipids with LDL of 64.  Total cholesterol 124.  Due to get repeat labs at PCPs office this November.

## 2021-05-04 NOTE — Assessment & Plan Note (Addendum)
CT scan from last year reviewed.  Stable findings postop. Continue blood pressure and lipid control for risk factors.  With planned recheck in likely 2024.  Can order at the next visit to be done prior to follow-up.

## 2021-05-04 NOTE — Patient Instructions (Signed)

## 2021-05-04 NOTE — Assessment & Plan Note (Signed)
No further breakthrough spells.  He is on Toprol 100 mgdaily.  With no recurrent spells since his operation, we have not placed him on DOAC.

## 2021-05-07 DIAGNOSIS — M25512 Pain in left shoulder: Secondary | ICD-10-CM | POA: Insufficient documentation

## 2021-05-12 DIAGNOSIS — M25512 Pain in left shoulder: Secondary | ICD-10-CM | POA: Diagnosis not present

## 2021-05-14 ENCOUNTER — Other Ambulatory Visit (HOSPITAL_COMMUNITY): Payer: Self-pay

## 2021-05-14 MED FILL — Atorvastatin Calcium Tab 20 MG (Base Equivalent): ORAL | 90 days supply | Qty: 90 | Fill #1 | Status: AC

## 2021-05-14 MED FILL — Metoprolol Succinate Tab ER 24HR 100 MG (Tartrate Equiv): ORAL | 90 days supply | Qty: 90 | Fill #1 | Status: AC

## 2021-05-14 MED FILL — Amlodipine Besylate-Benazepril HCl Cap 5-20 MG: ORAL | 90 days supply | Qty: 90 | Fill #1 | Status: AC

## 2021-05-15 DIAGNOSIS — M25512 Pain in left shoulder: Secondary | ICD-10-CM | POA: Diagnosis not present

## 2021-05-22 DIAGNOSIS — M25512 Pain in left shoulder: Secondary | ICD-10-CM | POA: Diagnosis not present

## 2021-05-26 DIAGNOSIS — M25512 Pain in left shoulder: Secondary | ICD-10-CM | POA: Diagnosis not present

## 2021-05-28 DIAGNOSIS — M25512 Pain in left shoulder: Secondary | ICD-10-CM | POA: Diagnosis not present

## 2021-06-04 DIAGNOSIS — M25512 Pain in left shoulder: Secondary | ICD-10-CM | POA: Diagnosis not present

## 2021-06-08 DIAGNOSIS — M25512 Pain in left shoulder: Secondary | ICD-10-CM | POA: Diagnosis not present

## 2021-06-09 DIAGNOSIS — M7542 Impingement syndrome of left shoulder: Secondary | ICD-10-CM | POA: Insufficient documentation

## 2021-06-10 DIAGNOSIS — M7542 Impingement syndrome of left shoulder: Secondary | ICD-10-CM | POA: Diagnosis not present

## 2021-06-10 DIAGNOSIS — M25512 Pain in left shoulder: Secondary | ICD-10-CM | POA: Diagnosis not present

## 2021-06-22 DIAGNOSIS — M25512 Pain in left shoulder: Secondary | ICD-10-CM | POA: Diagnosis not present

## 2021-06-30 DIAGNOSIS — M25511 Pain in right shoulder: Secondary | ICD-10-CM | POA: Insufficient documentation

## 2021-07-01 DIAGNOSIS — M25511 Pain in right shoulder: Secondary | ICD-10-CM | POA: Diagnosis not present

## 2021-09-11 ENCOUNTER — Other Ambulatory Visit (HOSPITAL_COMMUNITY): Payer: Self-pay

## 2021-09-11 ENCOUNTER — Other Ambulatory Visit: Payer: Self-pay | Admitting: Family Medicine

## 2021-09-15 ENCOUNTER — Encounter: Payer: Self-pay | Admitting: Family Medicine

## 2021-09-15 ENCOUNTER — Ambulatory Visit: Payer: 59 | Admitting: Family Medicine

## 2021-09-15 ENCOUNTER — Other Ambulatory Visit (HOSPITAL_COMMUNITY): Payer: Self-pay

## 2021-09-15 ENCOUNTER — Other Ambulatory Visit: Payer: Self-pay

## 2021-09-15 VITALS — BP 122/82 | HR 79 | Temp 97.9°F | Resp 18 | Ht 73.0 in | Wt 227.0 lb

## 2021-09-15 DIAGNOSIS — I2510 Atherosclerotic heart disease of native coronary artery without angina pectoris: Secondary | ICD-10-CM

## 2021-09-15 DIAGNOSIS — E7800 Pure hypercholesterolemia, unspecified: Secondary | ICD-10-CM

## 2021-09-15 DIAGNOSIS — Z9889 Other specified postprocedural states: Secondary | ICD-10-CM | POA: Diagnosis not present

## 2021-09-15 DIAGNOSIS — Z8679 Personal history of other diseases of the circulatory system: Secondary | ICD-10-CM | POA: Diagnosis not present

## 2021-09-15 DIAGNOSIS — R739 Hyperglycemia, unspecified: Secondary | ICD-10-CM | POA: Diagnosis not present

## 2021-09-15 DIAGNOSIS — Z125 Encounter for screening for malignant neoplasm of prostate: Secondary | ICD-10-CM | POA: Diagnosis not present

## 2021-09-15 DIAGNOSIS — I10 Essential (primary) hypertension: Secondary | ICD-10-CM

## 2021-09-15 MED ORDER — SILDENAFIL CITRATE 100 MG PO TABS
50.0000 mg | ORAL_TABLET | Freq: Every day | ORAL | 11 refills | Status: DC | PRN
  Filled 2021-09-15: qty 5, 30d supply, fill #0

## 2021-09-15 MED ORDER — ATORVASTATIN CALCIUM 20 MG PO TABS
20.0000 mg | ORAL_TABLET | Freq: Every day | ORAL | 3 refills | Status: DC
  Filled 2021-09-15: qty 90, 90d supply, fill #0
  Filled 2022-01-08: qty 90, 90d supply, fill #1
  Filled 2022-05-06: qty 90, 90d supply, fill #2
  Filled 2022-08-15: qty 90, 90d supply, fill #3

## 2021-09-15 MED ORDER — AMLODIPINE BESY-BENAZEPRIL HCL 5-20 MG PO CAPS
1.0000 | ORAL_CAPSULE | Freq: Every day | ORAL | 3 refills | Status: DC
  Filled 2021-09-15: qty 90, 90d supply, fill #0
  Filled 2022-01-08: qty 90, 90d supply, fill #1
  Filled 2022-05-06: qty 90, 90d supply, fill #2
  Filled 2022-08-15: qty 90, 90d supply, fill #3

## 2021-09-15 MED ORDER — METOPROLOL SUCCINATE ER 100 MG PO TB24
100.0000 mg | ORAL_TABLET | Freq: Every day | ORAL | 3 refills | Status: DC
  Filled 2021-09-15: qty 90, 90d supply, fill #0
  Filled 2022-01-08: qty 90, 90d supply, fill #1
  Filled 2022-05-06: qty 90, 90d supply, fill #2
  Filled 2022-08-15: qty 90, 90d supply, fill #3

## 2021-09-15 NOTE — Progress Notes (Signed)
Subjective:    Patient ID: Lawrence Liu, male    DOB: 11-18-1955, 66 y.o.   MRN: [id number]  HPI  Past medical history is significant for aortic aneurysm repair in 2019.  He also has a history of nonobstructive coronary artery disease as listed below.  Patient is a very pleasant 66 year old Caucasian gentleman here today for a checkup.  He denies any concerns.  He states occasionally he feels palpitations.  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  He denies any atrial fibrillation.  He sees occasional PVCs.  The palpitations never last.  He denies any syncope or chest pain or shortness of breath or dyspnea on exertion.  His blood pressure today is outstanding at 122/82.  He denies any myalgias or right upper quadrant pain on statin.  He is due for fasting lab work as well as a PSA to screen for prostate cancer.  His last colonoscopy was 2018.  This was clear however his gastroenterologist recommended 5 years for surveillance which would be now.  I discussed this with the patient.  He was under the impression that they wanted him to come back in 10 years. Past Medical History:  Diagnosis Date   Alpha galactosidase deficiency    multiple tick bites   Deep vein thrombosis ([place]) 2004   Left deep vein thrombosis of left calf   GERD (gastroesophageal reflux disease)    History of blood transfusion    as a baby for jaundice   Hypertension    Nonobstructive atherosclerosis of coronary artery 09/2013   Mild diffuse disease. No lesions greater than 30% noted. - confirmed by Cor CTA 12/2016: Cor Ca++ Score 153. Normal Coronary Origings. R Dominant.  Mild Non-obstructive Plaque in RCA, LAD & OM1 --> Rec Med Rx of CRFs   Pneumonia 1980's   walking pneumonia   Status post ascending aortic aneurysm repair 08/2016   Dr. Cyndia Bent   Thoracic ascending aortic aneurysm 11/06/2013   CTA of chest November 2017 showed notable increase in size --> s/p Open (Valve Sparing)  Repair Jan 2019)   Past Surgical History:  Procedure Laterality Date   ASCENDING AORTIC ROOT REPLACEMENT N/A 08/15/2017   Procedure: ASCENDING AORTIC ROOT REPLACEMENT, (VALVE SPARING ROOT REPLACEMENT);  Surgeon: [name], MD;  Location: [place];  Service: Open Heart Surgery;  Laterality: N/A;   CHOLECYSTECTOMY N/A 07/24/2018   Procedure: LAPAROSCOPIC CHOLECYSTECTOMY WITH INTRAOPERATIVE CHOLANGIOGRAM;  Surgeon: Alphonsa Overall, MD;  Location: [facility];  Service: General;  Laterality: N/A;   COLONOSCOPY WITH PROPOFOL N/A 11/23/2016   Procedure: COLONOSCOPY WITH PROPOFOL;  Surgeon: Juanita Craver, MD;  Location: [facility];  Service: Endoscopy;  Laterality: N/A;   colonscopy  5 1/2 yrs ago   polyps removed   EYE SURGERY Bilateral    LEFT HEART CATHETERIZATION WITH CORONARY ANGIOGRAM N/A 10/03/2013   Procedure: LEFT HEART CATHETERIZATION WITH CORONARY ANGIOGRAM;  Surgeon: Jolaine Artist, MD;  Location: [facility] CATH [facility];  Service: Cardiovascular:  Nonobstructive disease - LAD diffuse 20-30%. Small distal vessel. Circumflex ostial 30-40%. Large OM 1 with 30% mid. Small OM 2 and several small PL branches. Large dominant RCA with 30% mid and diffuse 20-30% distal.   SHOULDER ARTHROSCOPY  07/15/2011   Procedure: ARTHROSCOPY SHOULDER;  Surgeon: Metta Clines Supple;  Location: [place];  Service: Orthopedics;  Laterality: Right;  RIGHT ARTHROSCOPY SUBACROMIAL DECOMPRESSION AND DISTAL CLAVICLE RESECTION   TEE WITHOUT CARDIOVERSION N/A 08/15/2017   Procedure: TRANSESOPHAGEAL  ECHOCARDIOGRAM (TEE);  Surgeon: [name], MD;  Location: [place];  Service: Open Heart Surgery;  Laterality: N/A;   TONSILLECTOMY  ~ 1970   adenoids also   TRANSTHORACIC ECHOCARDIOGRAM  March 2015   Upper limit size LV. Mild LVH. EF 60%. Normal wall motion. Ascending aorta measures 48 mm at root tapering to 40 mm at proximal ascending. Mild RV dilation.   Current Outpatient Medications on File Prior to Visit  Medication Sig Dispense Refill    amLODipine-benazepril (LOTREL) 5-20 MG capsule TAKE 1 CAPSULE BY MOUTH DAILY. 90 capsule 3   Ascorbic Acid (VITAMIN C) 1000 MG tablet Take 1,000 mg by mouth daily.     aspirin 81 MG EC tablet Take 81 mg by mouth daily. Swallow whole.     atorvastatin (LIPITOR) 20 MG tablet TAKE 1 TABLET (20 MG TOTAL) BY MOUTH DAILY. 90 tablet 3   Cholecalciferol (VITAMIN D3) 5000 units CAPS Take 5,000 Units by mouth daily.     Coenzyme Q10 (CO Q-10) 300 MG CAPS Take 300 mg by mouth daily.     Cyanocobalamin (VITAMIN B-12) 5000 MCG TBDP Take 5,000 mcg by mouth daily.      EPINEPHrine (EPIPEN 2-PAK) 0.3 mg/0.3 mL IJ SOAJ injection Inject 0.3 mLs (0.3 mg total) into the muscle as needed for anaphylaxis (for severe allergic reaction with difficulty breathing, tongue or lip swelling, throat tightness). 1 each 0   famotidine (PEPCID) 20 MG tablet Take 20 mg by mouth daily.     fexofenadine (ALLEGRA) 180 MG tablet Take 180 mg by mouth daily.     metoprolol succinate (TOPROL-XL) 100 MG 24 hr tablet TAKE 1 TABLET (100 MG TOTAL) BY MOUTH DAILY. 90 tablet 3   Probiotic Product (PROBIOTIC PO) Take 1 tablet by mouth daily. PROBIOTICS 10     vitamin E 400 UNIT capsule Take 800 Units by mouth daily.      No current facility-administered medications on file prior to visit.   Allergies  Allergen Reactions   Latex Anaphylaxis   Penicillins Hives    Whencombined with sulfa, CAN take penicillin by itself Has patient had a PCN reaction causing immediate rash, facial/tongue/throat swelling, SOB or lightheadedness with hypotension: Yes Has patient had a PCN reaction causing severe rash involving mucus membranes or skin necrosis: No Has patient had a PCN reaction that required hospitalization: No Has patient had a PCN reaction occurring within the last 10 years: No If all of the above answers are "NO", then may proceed with Cephalosporin use.   Sulfa Antibiotics Hives    Only when mixed sulfa and pencillin together   Social  History   Socioeconomic History   Marital status: Married    Spouse name: Not on file   Number of children: Not on file   Years of education: Not on file   Highest education level: Not on file  Occupational History   Not on file  Tobacco Use   Smoking status: Never   Smokeless tobacco: Never  Vaping Use   Vaping Use: Never used  Substance and Sexual Activity   Alcohol use: Yes    Comment: 10/02/2013 "hard apple cider maybe once/month"   Drug use: No   Sexual activity: Yes  Other Topics Concern   Not on file  Social History Narrative   Not on file   Social Determinants of Health   Financial Resource Strain: Not on file  Food Insecurity: Not on file  Transportation Needs: Not on file  Physical Activity:  Not on file  Stress: Not on file  Social Connections: Not on file  Intimate Partner Violence: Not on file   Family History  Problem Relation Age of Onset   Heart disease Father    Diabetes Father    Diabetes Mother       Review of Systems  All other systems reviewed and are negative.     Objective:   Physical Exam Vitals reviewed.  Constitutional:      General: He is not in acute distress.    Appearance: Normal appearance. He is normal weight. He is not ill-appearing, toxic-appearing or diaphoretic.  HENT:     Head: Normocephalic and atraumatic.     Right Ear: Tympanic membrane, ear canal and external ear normal. There is no impacted cerumen.     Left Ear: Tympanic membrane, ear canal and external ear normal. There is no impacted cerumen.     Nose: Nose normal. No congestion or rhinorrhea.     Mouth/Throat:     Mouth: Mucous membranes are moist.     Pharynx: Oropharynx is clear. No oropharyngeal exudate or posterior oropharyngeal erythema.  Eyes:     General: No scleral icterus.       Right eye: No discharge.        Left eye: No discharge.     Extraocular Movements: Extraocular movements intact.     Conjunctiva/sclera: Conjunctivae normal.     Pupils:  Pupils are equal, round, and reactive to light.  Neck:     Vascular: No carotid bruit.  Cardiovascular:     Rate and Rhythm: Normal rate and regular rhythm.     Pulses: Normal pulses.     Heart sounds: Normal heart sounds. No murmur heard.   No friction rub. No gallop.  Pulmonary:     Effort: Pulmonary effort is normal. No respiratory distress.     Breath sounds: Normal breath sounds. No stridor. No wheezing, rhonchi or rales.  Chest:     Chest wall: No tenderness.  Abdominal:     General: Bowel sounds are normal. There is no distension.     Palpations: Abdomen is soft. There is no mass.     Tenderness: There is no abdominal tenderness. There is no right CVA tenderness, left CVA tenderness, guarding or rebound.     Hernia: No hernia is present.  Genitourinary:    Prostate: Normal.     Rectum: Normal.  Musculoskeletal:     Cervical back: Normal range of motion and neck supple. No rigidity. No muscular tenderness.     Right lower leg: No edema.     Left lower leg: No edema.  Lymphadenopathy:     Cervical: No cervical adenopathy.  Skin:    General: Skin is warm.     Coloration: Skin is not jaundiced or pale.     Findings: No bruising, erythema, lesion or rash.  Neurological:     General: No focal deficit present.     Mental Status: He is alert and oriented to person, place, and time. Mental status is at baseline.     Cranial Nerves: No cranial nerve deficit.     Sensory: No sensory deficit.     Motor: No weakness.     Coordination: Coordination normal.     Gait: Gait normal.     Deep Tendon Reflexes: Reflexes normal.  Psychiatric:        Mood and Affect: Mood normal.        Behavior: Behavior normal.  Thought Content: Thought content normal.        Judgment: Judgment normal.          Assessment & Plan:  Essential hypertension - Plan: CBC with Differential/Platelet, Lipid panel, COMPLETE METABOLIC PANEL WITH GFR  Pure hypercholesterolemia - Plan: CBC with  Differential/Platelet, Lipid panel, COMPLETE METABOLIC PANEL WITH GFR  Prostate cancer screening - Plan: PSA  S/P aortic aneurysm repair  Coronary artery disease involving native coronary artery of native heart without angina pectoris - Plan: CBC with Differential/Platelet, Lipid panel, COMPLETE METABOLIC PANEL WITH GFR Patient's blood pressure is outstanding.  I will check a CBC, CMP, fasting lipid panel.  Ideally I like to see his LDL cholesterol below 70 given his history of nonobstructive coronary artery disease.  While checking fasting lab work we will also check a PSA.  His blood pressure is at goal.  We will not make any changes in the dose of Lotrel and metoprolol.  The patient does have occasional PVCs but these are nonbothersome to him.  I asked him to contact me if he develops any type of arrhythmia other than PVCs.  Patient also requested prescription for Viagra to be used sparingly as needed for erectile dysfunction.  I recommended that he contact his gastroenterologist to discuss scheduling a colonoscopy

## 2021-09-17 NOTE — Addendum Note (Signed)
Addended by: Wadie Lessen on: 09/17/2021 05:40 PM   Modules accepted: Orders

## 2021-09-18 ENCOUNTER — Other Ambulatory Visit: Payer: Self-pay

## 2021-09-18 DIAGNOSIS — R739 Hyperglycemia, unspecified: Secondary | ICD-10-CM

## 2021-09-22 LAB — COMPLETE METABOLIC PANEL WITH GFR
AG Ratio: 1.9 (calc) (ref 1.0–2.5)
ALT: 25 U/L (ref 9–46)
AST: 18 U/L (ref 10–35)
Albumin: 4.4 g/dL (ref 3.6–5.1)
Alkaline phosphatase (APISO): 67 U/L (ref 35–144)
BUN: 19 mg/dL (ref 7–25)
CO2: 26 mmol/L (ref 20–32)
Calcium: 9.2 mg/dL (ref 8.6–10.3)
Chloride: 108 mmol/L (ref 98–110)
Creat: 1.07 mg/dL (ref 0.70–1.35)
Globulin: 2.3 g/dL (calc) (ref 1.9–3.7)
Glucose, Bld: 120 mg/dL — ABNORMAL HIGH (ref 65–99)
Potassium: 4.1 mmol/L (ref 3.5–5.3)
Sodium: 141 mmol/L (ref 135–146)
Total Bilirubin: 0.5 mg/dL (ref 0.2–1.2)
Total Protein: 6.7 g/dL (ref 6.1–8.1)
eGFR: 78 mL/min/{1.73_m2} (ref >=60)

## 2021-09-22 LAB — LIPID PANEL
Cholesterol: 115 mg/dL (ref <200)
HDL: 47 mg/dL (ref >=40)
LDL Cholesterol (Calc): 52 mg/dL (calc)
Non-HDL Cholesterol (Calc): 68 mg/dL (calc) (ref <130)
Total CHOL/HDL Ratio: 2.4 (calc) (ref <5.0)
Triglycerides: 75 mg/dL (ref <150)

## 2021-09-22 LAB — CBC WITH DIFFERENTIAL/PLATELET
Absolute Monocytes: 435 cells/uL (ref 200–950)
Basophils Absolute: 32 cells/uL (ref 0–200)
Basophils Relative: 0.5 %
Eosinophils Absolute: 301 cells/uL (ref 15–500)
Eosinophils Relative: 4.7 %
HCT: 43.7 % (ref 38.5–50.0)
Hemoglobin: 14.4 g/dL (ref 13.2–17.1)
Lymphs Abs: 2272 cells/uL (ref 850–3900)
MCH: 27 pg (ref 27.0–33.0)
MCHC: 33 g/dL (ref 32.0–36.0)
MCV: 82 fL (ref 80.0–100.0)
MPV: 10.5 fL (ref 7.5–12.5)
Monocytes Relative: 6.8 %
Neutro Abs: 3360 cells/uL (ref 1500–7800)
Neutrophils Relative %: 52.5 %
Platelets: 214 10*3/uL (ref 140–400)
RBC: 5.33 10*6/uL (ref 4.20–5.80)
RDW: 13.6 % (ref 11.0–15.0)
Total Lymphocyte: 35.5 %
WBC: 6.4 10*3/uL (ref 3.8–10.8)

## 2021-09-22 LAB — TEST AUTHORIZATION

## 2021-09-22 LAB — PSA: PSA: 1.67 ng/mL (ref <=4.00)

## 2021-09-22 LAB — HEMOGLOBIN A1C W/OUT EAG: Hgb A1c MFr Bld: 5.6 % of total Hgb (ref <5.7)

## 2022-01-08 ENCOUNTER — Other Ambulatory Visit (HOSPITAL_COMMUNITY): Payer: Self-pay

## 2022-05-06 ENCOUNTER — Other Ambulatory Visit (HOSPITAL_COMMUNITY): Payer: Self-pay

## 2022-06-14 ENCOUNTER — Ambulatory Visit: Payer: 59 | Attending: Cardiology | Admitting: Cardiology

## 2022-06-14 ENCOUNTER — Ambulatory Visit (INDEPENDENT_AMBULATORY_CARE_PROVIDER_SITE_OTHER): Payer: 59

## 2022-06-14 ENCOUNTER — Encounter: Payer: Self-pay | Admitting: Cardiology

## 2022-06-14 VITALS — BP 124/76 | HR 68 | Ht 73.0 in | Wt 231.4 lb

## 2022-06-14 DIAGNOSIS — Z9889 Other specified postprocedural states: Secondary | ICD-10-CM

## 2022-06-14 DIAGNOSIS — I4891 Unspecified atrial fibrillation: Secondary | ICD-10-CM

## 2022-06-14 DIAGNOSIS — E785 Hyperlipidemia, unspecified: Secondary | ICD-10-CM

## 2022-06-14 DIAGNOSIS — I9789 Other postprocedural complications and disorders of the circulatory system, not elsewhere classified: Secondary | ICD-10-CM

## 2022-06-14 DIAGNOSIS — R002 Palpitations: Secondary | ICD-10-CM

## 2022-06-14 DIAGNOSIS — I10 Essential (primary) hypertension: Secondary | ICD-10-CM

## 2022-06-14 DIAGNOSIS — I493 Ventricular premature depolarization: Secondary | ICD-10-CM | POA: Diagnosis not present

## 2022-06-14 DIAGNOSIS — Z8679 Personal history of other diseases of the circulatory system: Secondary | ICD-10-CM

## 2022-06-14 NOTE — Patient Instructions (Signed)
Medication Instructions:  Not needed  *If you need a refill on your cardiac medications before your next appointment, please call your pharmacy*   Lab Work:  If you have labs (blood work) drawn today and your tests are completely normal, you will receive your results only by: [facility]. [facility] Colon (if you have MyChart) OR A paper copy in the mail If you have any lab test that is abnormal or we need to change your treatment, we will call you to review the results.   Testing/Procedures:   Will be schedule at [facility] - radiology in Jan 2024 Non-Cardiac CT Angiography (CTA), is a special type of CT scan that uses a computer to produce multi-dimensional views of major blood vessels throughout the body. In CT angiography, a contrast material is injected through an IV to help visualize the blood vessels - aorta   Follow-Up: At [facility], you and your health needs are our priority.  As part of our continuing mission to provide you with exceptional heart care, we have created designated Provider Care Teams.  These Care Teams include your primary Cardiologist (physician) and Advanced Practice Providers (APPs -  Physician Assistants and Nurse Practitioners) who all work together to provide you with the care you need, when you need it.     Your next appointment:   13  month(s) (Dec 2024)   The format for your next appointment:   In Person  Provider:   Glenetta Hew, MD    Other Instructions

## 2022-06-14 NOTE — Progress Notes (Signed)
Primary Care Provider: [name], MD [place] Cardiologist: Glenetta Hew, MD Electrophysiologist: None  Clinic Note: Chief Complaint  Patient presents with   Follow-up    Annual follow-up.  Doing well but noting palpitations   Palpitations   ===================================  ASSESSMENT/PLAN   Problem List Items Addressed This Visit       Cardiology Problems   Essential hypertension (Chronic)    Well-controlled blood pressure today. Continue current dose of 100 mg Toprol and Lotrel 5-20 mg. Not on diuretic but doing well.      Postoperative atrial fibrillation (HCC) (Chronic)    Had not had a breakthrough episode since his surgery.  At this point, with him having more very heartbeat episodes, need to make sure that he is not actually having A-fib.  Could just be PVCs but would like to be sure.  Since he had a breakthrough episodes, he is not on DOAC.  Only on aspirin. Also on high-dose beta-blocker for rate control.  Plan: Zio patch monitor      Relevant Orders   EKG 12-Lead (Completed)   LONG TERM MONITOR (3-14 DAYS)   CT ANGIO CHEST AORTA W/CM & OR WO/CM   Basic metabolic panel   PVC's (premature ventricular contractions) (Chronic)    He has had PVCs in the past that were somewhat symptomatic.  He is having palpitations that are are happening with more frequency.  Need to exclude A-fib or progression of PVC burden. Plan: Zio patch monitor.       Relevant Orders   LONG TERM MONITOR (3-14 DAYS)   Basic metabolic panel     Other   Rapid palpitations - Primary    Zio patch      Relevant Orders   EKG 12-Lead (Completed)   LONG TERM MONITOR (3-14 DAYS)   Basic metabolic panel   Atherogenic dyslipidemia (Chronic)    Labs were checked in February by his PCP.  LDL down to 52 and triglycerides normal.  HDL up to 47.  Continue atorvastatin 20 mg.      S/P aortic aneurysm repair (Chronic)    Last CT scan was in 2021.  Plan was to  follow-up again in late 2023 or early 2024.. .. . .Plan: Recheck CT Angio Chest-Aorta      Relevant Orders   CT ANGIO CHEST AORTA W/CM & OR WO/CM   Basic metabolic panel    ===================================  HPI:    Lawrence Liu is a 66 y.o. male with a PMH notable for ascending aortic aneurysm-s/p valve sparing repair/graft placement who presents today for annual follow-up at the request of [name], MD.  PAF-postop ASCENDING AORTIC ANEURYSM: Sept 2018: CTA CHEST - with Coronary CTA  Jan 2019 - : ASCENDING AORTIC ROOT REPLACEMENT, (VALVE SPARING ROOT REPLACEMENT);  [name], MD;  [name] was last seen on May 04, 2021.  He was doing pretty well having rare palpitations but nothing prolonged.  Nothing like when he had A-fib.  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  Walks the dog about a mile or so every morning.  No major complaints.  Recent Hospitalizations: None  Reviewed  CV studies:    The following studies were reviewed today: (if available, images/films reviewed: From Epic Chart or Care Everywhere) None: Due for follow-up CT angiogram  Interval History:   [name] presents today overall doing pretty well but he has been having more frequent episodes of irregular heartbeats or  skipping beats.  Episodes really do not last that long.  He says sometimes up to 30 seconds but they happen a couple times a week and they are starting to come more frequently.  State he still is not think is A-fib, but started to get concerned.  Otherwise he denies any chest pain or pressure with rest or exertion.  No PND, with apnea.  No syncope or near syncope TIA or amaurosis fugax.  No claudication.  He is a little bit down because he says they just.  His brother passed away after several months of failure to thrive from dementia.  She was 91.  In the way this is a big weight lifted off of him, but he is still sad.  REVIEWED OF  SYSTEMS   Review of Systems  Constitutional:  Negative for malaise/fatigue and weight loss.  HENT:  Negative for congestion.   Respiratory:  Negative for cough and shortness of breath.   Gastrointestinal:  Negative for abdominal pain, blood in stool and melena.  Genitourinary:  Negative for hematuria.  Musculoskeletal:  Positive for joint pain. Negative for falls and myalgias.  Neurological:  Negative for dizziness and focal weakness.  Psychiatric/Behavioral: Negative.      I have reviewed and (if needed) personally updated the patient's problem list, medications, allergies, past medical and surgical history, social and family history.   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 No lesions greater than 30% noted. - confirmed by Cor CTA 12/2016: Cor Ca++ Score 153. Normal Coronary Origings. R Dominant.  Mild Non-obstructive Plaque in RCA, LAD & OM1 --> Rec Med Rx of CRFs   Pneumonia 1980's   walking pneumonia   Status post ascending aortic aneurysm repair 08/2016   Dr. Cyndia Bent   Thoracic ascending aortic aneurysm [facility]) 11/06/2013   CTA of chest November 2017 showed notable increase in size --> s/p Open (Valve Sparing) Repair Jan 2019)    PAST SURGICAL HISTORY   Past Surgical History:  Procedure Laterality Date   ASCENDING AORTIC ROOT REPLACEMENT N/A 08/15/2017   Procedure: ASCENDING AORTIC ROOT REPLACEMENT, (VALVE SPARING ROOT REPLACEMENT);  Surgeon: [name], MD;  Location: [facility];  Service: Open Heart Surgery;  Laterality: N/A;   CHOLECYSTECTOMY N/A 07/24/2018   Procedure: LAPAROSCOPIC CHOLECYSTECTOMY WITH INTRAOPERATIVE  CHOLANGIOGRAM;  Surgeon: Alphonsa Overall, MD;  Location: [facility];  Service: General;  Laterality: N/A;   COLONOSCOPY WITH PROPOFOL N/A 11/23/2016   Procedure: COLONOSCOPY WITH PROPOFOL;  Surgeon: Juanita Craver, MD;  Location: [facility];  Service: Endoscopy;  Laterality: N/A;   colonscopy  5 1/2 yrs ago   polyps removed   EYE SURGERY Bilateral    LEFT HEART CATHETERIZATION WITH CORONARY ANGIOGRAM N/A 10/03/2013   Procedure: LEFT HEART CATHETERIZATION WITH CORONARY ANGIOGRAM;  Surgeon: Jolaine Artist, MD;  Location: [facility] CATH LAB;  Service: Cardiovascular:  Nonobstructive disease - LAD diffuse 20-30%. Small distal vessel. Circumflex ostial 30-40%. Large OM 1 with 30% mid. Small OM 2 and several small PL branches. Large dominant RCA with 30% mid and diffuse 20-30% distal.   SHOULDER ARTHROSCOPY  07/15/2011  Procedure: ARTHROSCOPY SHOULDER;  Surgeon: Marin Shutter;  Location: [facility];  Service: Orthopedics;  Laterality: Right;  RIGHT ARTHROSCOPY SUBACROMIAL DECOMPRESSION AND DISTAL CLAVICLE RESECTION   TEE WITHOUT CARDIOVERSION N/A 08/15/2017   Procedure: TRANSESOPHAGEAL ECHOCARDIOGRAM (TEE);  Surgeon: [name], MD;  Location: [facility];  Service: Open Heart Surgery;  Laterality: N/A;   TONSILLECTOMY  ~ 1970   adenoids also   TRANSTHORACIC ECHOCARDIOGRAM  March 2015   Upper limit size LV. Mild LVH. EF 60%. Normal wall motion. Ascending aorta measures 48 mm at root tapering to 40 mm at proximal ascending. Mild RV dilation.    Immunization History  Administered Date(s) Administered   Influenza-Unspecified 04/02/2013, 05/30/2015, 04/16/2017   Pneumococcal Polysaccharide-23 08/23/2017   Tdap 08/03/2007, 03/01/2019    MEDICATIONS/ALLERGIES   Current Meds  Medication Sig   amLODipine-benazepril (LOTREL) 5-20 MG capsule TAKE 1 CAPSULE BY MOUTH DAILY.   Ascorbic Acid (VITAMIN C) 1000 MG tablet Take 1,000 mg by mouth daily.   aspirin 81 MG EC tablet Take 81 mg by mouth daily. Swallow whole.    atorvastatin (LIPITOR) 20 MG tablet Take 1 tablet (20 mg total) by mouth daily.   Cholecalciferol (VITAMIN D3) 5000 units CAPS Take 5,000 Units by mouth daily.   Coenzyme Q10 (CO Q-10) 300 MG CAPS Take 300 mg by mouth daily.   Cyanocobalamin (VITAMIN B-12) 5000 MCG TBDP Take 5,000 mcg by mouth daily.    EPINEPHrine (EPIPEN 2-PAK) 0.3 mg/0.3 mL IJ SOAJ injection Inject 0.3 mLs (0.3 mg total) into the muscle as needed for anaphylaxis (for severe allergic reaction with difficulty breathing, tongue or lip swelling, throat tightness).   famotidine (PEPCID) 20 MG tablet Take 20 mg by mouth daily.   fexofenadine (ALLEGRA) 180 MG tablet Take 180 mg by mouth daily.   metoprolol succinate (TOPROL-XL) 100 MG 24 hr tablet Take 1 tablet (100 mg total) by mouth daily.   OVER THE COUNTER MEDICATION FOCUS FACTOR   Probiotic Product (PROBIOTIC PO) Take 1 tablet by mouth daily. PROBIOTICS 10   vitamin E 400 UNIT capsule Take 800 Units by mouth daily.     Allergies  Allergen Reactions   Latex Anaphylaxis   Penicillins Hives    Whencombined with sulfa, CAN take penicillin by itself Has patient had a PCN reaction causing immediate rash, facial/tongue/throat swelling, SOB or lightheadedness with hypotension: Yes Has patient had a PCN reaction causing severe rash involving mucus membranes or skin necrosis: No Has patient had a PCN reaction that required hospitalization: No Has patient had a PCN reaction occurring within the last 10 years: No If all of the above answers are "NO", then may proceed with Cephalosporin use.   Sulfa Antibiotics Hives    Only when mixed sulfa and pencillin together    SOCIAL HISTORY/FAMILY HISTORY   Reviewed in Epic:  Pertinent findings:  Social History   Tobacco Use   Smoking status: Never   Smokeless tobacco: Never  Vaping Use   Vaping Use: Never used  Substance Use Topics   Alcohol use: Yes    Comment: 10/02/2013 "hard apple cider maybe once/month"   Drug use: No    Social History   Social History Narrative   Not on file    OBJCTIVE -PE, EKG, labs   Wt Readings from Last 3 Encounters:  06/14/22 231 lb 6.4 oz (105 kg)  09/15/21 227 lb (103 kg)  05/04/21 225 lb 3.2 oz (102.2 kg)    Physical Exam: BP 124/76 (BP Location: Left  Arm, Patient Position: Sitting, Cuff Size: Large)   Pulse 68   Ht '6\' 1"'$  (1.854 m)   Wt 231 lb 6.4 oz (105 kg)   SpO2 98%   BMI 30.53 kg/m  Physical Exam Vitals reviewed.  Constitutional:      General: He is not in acute distress.    Appearance: Normal appearance. He is obese. He is not ill-appearing or toxic-appearing.     Comments: Well-nourished, well-groomed.  HENT:     Head: Normocephalic and atraumatic.  Neck:     Vascular: No carotid bruit.  Cardiovascular:     Rate and Rhythm: Normal rate and regular rhythm.     Pulses: Normal pulses.     Heart sounds: Normal heart sounds. No murmur heard.    No friction rub. No gallop.  Pulmonary:     Effort: Pulmonary effort is normal. No respiratory distress.     Breath sounds: Normal breath sounds. No wheezing, rhonchi or rales.  Musculoskeletal:        General: Normal range of motion.     Cervical back: Normal range of motion and neck supple.  Skin:    General: Skin is warm and dry.  Neurological:     General: No focal deficit present.     Mental Status: He is alert and oriented to person, place, and time. Mental status is at baseline.     Gait: Gait normal.  Psychiatric:        Mood and Affect: Mood normal.        Behavior: Behavior normal.        Thought Content: Thought content normal.        Judgment: Judgment normal.    Adult ECG Report  Rate: 68;  Rhythm: normal sinus rhythm; normal axis, intervals and durations  Interpretation: Normal  Recent Labs: Reviewed Lab Results  Component Value Date   CHOL 115 09/15/2021   HDL 47 09/15/2021   LDLCALC 52 09/15/2021   TRIG 75 09/15/2021   CHOLHDL 2.4 09/15/2021   Lab Results  Component Value Date    CREATININE 1.07 09/15/2021   BUN 19 09/15/2021   NA 141 09/15/2021   K 4.1 09/15/2021   CL 108 09/15/2021   CO2 26 09/15/2021      Latest Ref Rng & Units 09/15/2021   11:12 AM 06/12/2020    8:11 AM 10/31/2019    4:19 AM  CBC  WBC 3.8 - 10.8 Thousand/uL 6.4  5.9  10.1   Hemoglobin 13.2 - 17.1 g/dL 14.4  13.6  14.0   Hematocrit 38.5 - 50.0 % 43.7  40.2  43.1   Platelets 140 - 400 Thousand/uL 214  199  260     Lab Results  Component Value Date   HGBA1C 5.6 09/15/2021   Lab Results  Component Value Date   TSH 1.750 10/02/2013    ================================================== I spent a total of 22 minutes with the patient spent in direct patient consultation.  Additional time spent with chart review  / charting (studies, outside notes, etc): 14 min Total Time: 34 none min  Current medicines are reviewed at length with the patient today.  (+/- concerns) N/A  Notice: This dictation was prepared with Dragon dictation along with smart phrase technology. Any transcriptional errors that result from this process are unintentional and may not be corrected upon review.  Studies Ordered:   Orders Placed This Encounter  Procedures   CT ANGIO CHEST AORTA W/CM & OR WO/CM   Basic metabolic  panel   LONG TERM MONITOR (3-14 DAYS)   EKG 12-Lead   No orders of the defined types were placed in this encounter.   Patient Instructions / Medication Changes & Studies & Tests Ordered   Patient Instructions  Medication Instructions:  Not needed  *If you need a refill on your cardiac medications before your next appointment, please call your pharmacy*   Lab Work:  If you have labs (blood work) drawn today and your tests are completely normal, you will receive your results only by: MyChart Message (if you have MyChart) OR A paper copy in the mail If you have any lab test that is abnormal or we need to change your treatment, we will call you to review the  results.   Testing/Procedures:   Will be schedule at [facility] - radiology in Jan 2024 Non-Cardiac CT Angiography (CTA), is a special type of CT scan that uses a computer to produce multi-dimensional views of major blood vessels throughout the body. In CT angiography, a contrast material is injected through an IV to help visualize the blood vessels - aorta   Follow-Up: At [facility], you and your health needs are our priority.  As part of our continuing mission to provide you with exceptional heart care, we have created designated Provider Care Teams.  These Care Teams include your primary Cardiologist (physician) and Advanced Practice Providers (APPs -  Physician Assistants and Nurse Practitioners) who all work together to provide you with the care you need, when you need it.     Your next appointment:   13  month(s) (Dec 2024)   The format for your next appointment:   In Person  Provider:   Glenetta Hew, MD    Other Instructions      Leonie Man, MD, MS Glenetta Hew, M.D., M.S. Interventional Cardiologist  [place]  Pager # [phone] Phone # [phone] [address] [place], [place] [address]   Thank you for choosing [facility] [place]!!

## 2022-06-14 NOTE — Progress Notes (Unsigned)
z

## 2022-06-19 ENCOUNTER — Encounter: Payer: Self-pay | Admitting: Cardiology

## 2022-06-19 NOTE — Assessment & Plan Note (Signed)
Last CT scan was in 2021.  Plan was to follow-up again in late 2023 or early 2024.. .. . .Plan: [place]

## 2022-06-19 NOTE — Assessment & Plan Note (Signed)
Labs were checked in February by his PCP.  LDL down to 52 and triglycerides normal.  HDL up to 47.  Continue atorvastatin 20 mg.

## 2022-06-19 NOTE — Assessment & Plan Note (Signed)
Ziopatch

## 2022-06-19 NOTE — Assessment & Plan Note (Addendum)
Had not had a breakthrough episode since his surgery.  At this point, with him having more very heartbeat episodes, need to make sure that he is not actually having A-fib.  Could just be PVCs but would like to be sure.  Since he had a breakthrough episodes, he is not on DOAC.  Only on aspirin. Also on high-dose beta-blocker for rate control.  Plan: Zio patch monitor

## 2022-06-19 NOTE — Assessment & Plan Note (Signed)
He has had PVCs in the past that were somewhat symptomatic.  He is having palpitations that are are happening with more frequency.  Need to exclude A-fib or progression of PVC burden. Plan: Zio patch monitor.

## 2022-06-19 NOTE — Assessment & Plan Note (Signed)
Well-controlled blood pressure today. Continue current dose of 100 mg Toprol and Lotrel 5-20 mg. Not on diuretic but doing well.

## 2022-07-07 DIAGNOSIS — I493 Ventricular premature depolarization: Secondary | ICD-10-CM | POA: Diagnosis not present

## 2022-07-07 DIAGNOSIS — I9789 Other postprocedural complications and disorders of the circulatory system, not elsewhere classified: Secondary | ICD-10-CM

## 2022-07-07 DIAGNOSIS — I4891 Unspecified atrial fibrillation: Secondary | ICD-10-CM

## 2022-07-07 DIAGNOSIS — R002 Palpitations: Secondary | ICD-10-CM

## 2022-07-24 DIAGNOSIS — I9789 Other postprocedural complications and disorders of the circulatory system, not elsewhere classified: Secondary | ICD-10-CM | POA: Diagnosis not present

## 2022-07-24 DIAGNOSIS — R002 Palpitations: Secondary | ICD-10-CM | POA: Diagnosis not present

## 2022-12-09 ENCOUNTER — Other Ambulatory Visit: Payer: Self-pay | Admitting: Family Medicine

## 2022-12-09 NOTE — Telephone Encounter (Signed)
Unable to refill per protocol, appointment needed.   Requested Prescriptions  Pending Prescriptions Disp Refills   atorvastatin (LIPITOR) 20 MG tablet 90 tablet 3    Sig: Take 1 tablet (20 mg total) by mouth daily.     Cardiovascular:  Antilipid - Statins Failed - 12/09/2022 11:06 AM      Failed - Valid encounter within last 12 months    Recent Outpatient Visits           1 year ago Essential hypertension   [facility] Pickard, Priscille Heidelberg, MD   2 years ago Essential hypertension   [facility] Pickard, Priscille Heidelberg, MD   3 years ago General medical exam   [facility] Donita Brooks, MD   4 years ago Acute bronchospasm   [facility] Danelle Berry, PA-C   4 years ago Bacterial respiratory infection   [facility] Allayne Butcher B, PA-C              Failed - Lipid Panel in normal range within the last 12 months    Cholesterol, Total  Date Value Ref Range Status  10/18/2016 105 100 - 199 mg/dL Final   Cholesterol  Date Value Ref Range Status  09/15/2021 115 <200 mg/dL Final   LDL Cholesterol (Calc)  Date Value Ref Range Status  09/15/2021 52 mg/dL (calc) Final    Comment:    Reference range: <100 . Desirable range <100 mg/dL for primary prevention;   <70 mg/dL for patients with CHD or diabetic patients  with > or = 2 CHD risk factors. Marland Kitchen LDL-C is now calculated using the Martin-Hopkins  calculation, which is a validated novel method providing  better accuracy than the Friedewald equation in the  estimation of LDL-C.  Horald Pollen et al. Lenox Ahr. 1610;960(45): 2061-2068  (http://education.[employer].com/faq/FAQ164)    HDL  Date Value Ref Range Status  09/15/2021 47 > OR = 40 mg/dL Final  40/98/1191 34 (L) >39 mg/dL Final   Triglycerides  Date Value Ref Range Status  09/15/2021 75 <150 mg/dL Final         Passed - Patient is not pregnant       metoprolol succinate (TOPROL-XL) 100  MG 24 hr tablet 90 tablet 3    Sig: Take 1 tablet (100 mg total) by mouth daily.     Cardiovascular:  Beta Blockers Failed - 12/09/2022 11:06 AM      Failed - Valid encounter within last 6 months    Recent Outpatient Visits           1 year ago Essential hypertension   [facility] Pickard, Priscille Heidelberg, MD   2 years ago Essential hypertension   [facility] Tanya Nones, Priscille Heidelberg, MD   3 years ago General medical exam   [facility] Donita Brooks, MD   4 years ago Acute bronchospasm   [facility] Danelle Berry, PA-C   4 years ago Bacterial respiratory infection   [facility] Allayne Butcher B, PA-C              Passed - Last BP in normal range    BP Readings from Last 1 Encounters:  06/14/22 124/76         Passed - Last Heart Rate in normal range    Pulse Readings from Last 1 Encounters:  06/14/22 68  amLODipine-benazepril (LOTREL) 5-20 MG capsule 90 capsule 3    Sig: TAKE 1 CAPSULE BY MOUTH DAILY.     Cardiovascular: CCB + ACEI Combos Failed - 12/09/2022 11:06 AM      Failed - Cr in normal range and within 180 days    Creat  Date Value Ref Range Status  09/15/2021 1.07 0.70 - 1.35 mg/dL Final         Failed - K in normal range and within 180 days    Potassium  Date Value Ref Range Status  09/15/2021 4.1 3.5 - 5.3 mmol/L Final         Failed - Na in normal range and within 180 days    Sodium  Date Value Ref Range Status  09/15/2021 141 135 - 146 mmol/L Final  02/08/2020 142 134 - 144 mmol/L Final         Failed - eGFR is 30 or above and within 180 days    GFR, Est African American  Date Value Ref Range Status  06/12/2020 102 > OR = 60 mL/min/1.85m2 Final   GFR, Est Non African American  Date Value Ref Range Status  06/12/2020 88 > OR = 60 mL/min/1.18m2 Final   GFR  Date Value Ref Range Status  04/23/2014 76.85 >60.00 mL/min Final   eGFR  Date Value Ref Range Status   09/15/2021 78 > OR = 60 mL/min/1.63m2 Final    Comment:    The eGFR is based on the CKD-EPI 2021 equation. To calculate  the new eGFR from a previous Creatinine or Cystatin C result, go to https://www.kidney.org/professionals/ kdoqi/gfr%5Fcalculator          Failed - Valid encounter within last 6 months    Recent Outpatient Visits           1 year ago Essential hypertension   [facility] Pickard, Priscille Heidelberg, MD   2 years ago Essential hypertension   [facility] Tanya Nones, Priscille Heidelberg, MD   3 years ago General medical exam   [facility] Donita Brooks, MD   4 years ago Acute bronchospasm   [facility] Danelle Berry, PA-C   4 years ago Bacterial respiratory infection   [facility] Dorena Bodo, [place]              Passed - Patient is not pregnant      Passed - Last BP in normal range    BP Readings from Last 1 Encounters:  06/14/22 124/76

## 2022-12-13 ENCOUNTER — Other Ambulatory Visit (HOSPITAL_COMMUNITY): Payer: Self-pay

## 2022-12-14 ENCOUNTER — Other Ambulatory Visit (HOSPITAL_COMMUNITY): Payer: Self-pay

## 2022-12-14 ENCOUNTER — Other Ambulatory Visit: Payer: Self-pay

## 2022-12-14 MED ORDER — ATORVASTATIN CALCIUM 20 MG PO TABS
20.0000 mg | ORAL_TABLET | Freq: Every day | ORAL | 0 refills | Status: DC
  Filled 2022-12-14: qty 30, 30d supply, fill #0

## 2022-12-14 MED ORDER — METOPROLOL SUCCINATE ER 100 MG PO TB24
100.0000 mg | ORAL_TABLET | Freq: Every day | ORAL | 0 refills | Status: DC
  Filled 2022-12-14: qty 30, 30d supply, fill #0

## 2022-12-14 MED ORDER — AMLODIPINE BESY-BENAZEPRIL HCL 5-20 MG PO CAPS
1.0000 | ORAL_CAPSULE | Freq: Every day | ORAL | 0 refills | Status: DC
  Filled 2022-12-14: qty 30, 30d supply, fill #0

## 2022-12-14 NOTE — Telephone Encounter (Signed)
Pt has upcoming appt on 12/20/22.   Requested Prescriptions  Pending Prescriptions Disp Refills   amLODipine-benazepril (LOTREL) 5-20 MG capsule 30 capsule 0    Sig: TAKE 1 CAPSULE BY MOUTH DAILY.     Cardiovascular: CCB + ACEI Combos Failed - 12/14/2022  4:45 PM      Failed - Cr in normal range and within 180 days    Creat  Date Value Ref Range Status  09/15/2021 1.07 0.70 - 1.35 mg/dL Final         Failed - K in normal range and within 180 days    Potassium  Date Value Ref Range Status  09/15/2021 4.1 3.5 - 5.3 mmol/L Final         Failed - Na in normal range and within 180 days    Sodium  Date Value Ref Range Status  09/15/2021 141 135 - 146 mmol/L Final  02/08/2020 142 134 - 144 mmol/L Final         Failed - eGFR is 30 or above and within 180 days    GFR, Est African American  Date Value Ref Range Status  06/12/2020 102 > OR = 60 mL/min/1.57m2 Final   GFR, Est Non African American  Date Value Ref Range Status  06/12/2020 88 > OR = 60 mL/min/1.31m2 Final   GFR  Date Value Ref Range Status  04/23/2014 76.85 >60.00 mL/min Final   eGFR  Date Value Ref Range Status  09/15/2021 78 > OR = 60 mL/min/1.34m2 Final    Comment:    The eGFR is based on the CKD-EPI 2021 equation. To calculate  the new eGFR from a previous Creatinine or Cystatin C result, go to https://www.kidney.org/professionals/ kdoqi/gfr%5Fcalculator          Failed - Valid encounter within last 6 months    Recent Outpatient Visits           1 year ago Essential hypertension   [facility] Pickard, Priscille Heidelberg, MD   2 years ago Essential hypertension   [facility] Pickard, Priscille Heidelberg, MD   3 years ago General medical exam   [facility] Donita Brooks, MD   4 years ago Acute bronchospasm   [facility] Danelle Berry, PA-C   4 years ago Bacterial respiratory infection   [facility] Dorena Bodo, PA-C       Future  Appointments             In 6 days Tanya Nones, Priscille Heidelberg, MD [facility], [facility]            Passed - Patient is not pregnant      Passed - Last BP in normal range    BP Readings from Last 1 Encounters:  06/14/22 124/76          atorvastatin (LIPITOR) 20 MG tablet 30 tablet 0    Sig: Take 1 tablet (20 mg total) by mouth daily.     Cardiovascular:  Antilipid - Statins Failed - 12/14/2022  4:45 PM      Failed - Valid encounter within last 12 months    Recent Outpatient Visits           1 year ago Essential hypertension   [facility] Pickard, Priscille Heidelberg, MD   2 years ago Essential hypertension   [facility] Pickard, Priscille Heidelberg, MD   3 years ago General medical exam  [facility] Pickard, Priscille Heidelberg, MD   4 years ago Acute bronchospasm   [facility] Danelle Berry, PA-C   4 years ago Bacterial respiratory infection   [facility] Dorena Bodo, PA-C       Future Appointments             In 6 days Tanya Nones, Priscille Heidelberg, MD [facility], [facility]            Failed - Lipid Panel in normal range within the last 12 months    Cholesterol, Total  Date Value Ref Range Status  10/18/2016 105 100 - 199 mg/dL Final   Cholesterol  Date Value Ref Range Status  09/15/2021 115 <200 mg/dL Final   LDL Cholesterol (Calc)  Date Value Ref Range Status  09/15/2021 52 mg/dL (calc) Final    Comment:    Reference range: <100 . Desirable range <100 mg/dL for primary prevention;   <70 mg/dL for patients with CHD or diabetic patients  with > or = 2 CHD risk factors. Marland Kitchen LDL-C is now calculated using the Martin-Hopkins  calculation, which is a validated novel method providing  better accuracy than the Friedewald equation in the  estimation of LDL-C.  Horald Pollen et al. Lenox Ahr. 1610;960(45): 2061-2068  (http://education.[facility].com/faq/FAQ164)    HDL  Date  Value Ref Range Status  09/15/2021 47 > OR = 40 mg/dL Final  40/98/1191 34 (L) >39 mg/dL Final   Triglycerides  Date Value Ref Range Status  09/15/2021 75 <150 mg/dL Final         Passed - Patient is not pregnant       metoprolol succinate (TOPROL-XL) 100 MG 24 hr tablet 30 tablet 0    Sig: Take 1 tablet (100 mg total) by mouth daily.     Cardiovascular:  Beta Blockers Failed - 12/14/2022  4:45 PM      Failed - Valid encounter within last 6 months    Recent Outpatient Visits           1 year ago Essential hypertension   [facility] Pickard, Priscille Heidelberg, MD   2 years ago Essential hypertension   [facility] Tanya Nones, Priscille Heidelberg, MD   3 years ago General medical exam   [facility] Donita Brooks, MD   4 years ago Acute bronchospasm   [facility] Danelle Berry, PA-C   4 years ago Bacterial respiratory infection   [facility] Dorena Bodo, PA-C       Future Appointments             In 6 days Pickard, Priscille Heidelberg, MD [facility], [facility]            Passed - Last BP in normal range    BP Readings from Last 1 Encounters:  06/14/22 124/76         Passed - Last Heart Rate in normal range    Pulse Readings from Last 1 Encounters:  06/14/22 68

## 2022-12-14 NOTE — Telephone Encounter (Signed)
Pt called in to ask for a courtesy refill of these meds. Pt states that he will be out of these meds before his scheduled appt on 5/20. Please advise  Rx #: [id number]  amLODipine-benazepril (LOTREL) 5-20 MG capsule [[id number]]  ENDED  Rx #: [id number]  atorvastatin (LIPITOR) 20 MG tablet [[id number]]  Rx #: [id number]  metoprolol succinate (TOPROL-XL) 100 MG 24 hr tablet [[id number]]  ENDED   PHARMACY: [facility] [address] [place] [address] Phone: [phone]  Fax: [phone]  CB#: [phone]

## 2022-12-20 ENCOUNTER — Ambulatory Visit (INDEPENDENT_AMBULATORY_CARE_PROVIDER_SITE_OTHER): Payer: 59 | Admitting: Family Medicine

## 2022-12-20 ENCOUNTER — Encounter: Payer: Self-pay | Admitting: Family Medicine

## 2022-12-20 ENCOUNTER — Other Ambulatory Visit (HOSPITAL_COMMUNITY): Payer: Self-pay

## 2022-12-20 VITALS — BP 120/78 | HR 81 | Temp 98.2°F | Ht 73.0 in | Wt 230.6 lb

## 2022-12-20 DIAGNOSIS — Z125 Encounter for screening for malignant neoplasm of prostate: Secondary | ICD-10-CM | POA: Diagnosis not present

## 2022-12-20 DIAGNOSIS — E7800 Pure hypercholesterolemia, unspecified: Secondary | ICD-10-CM

## 2022-12-20 DIAGNOSIS — I10 Essential (primary) hypertension: Secondary | ICD-10-CM

## 2022-12-20 LAB — CBC WITH DIFFERENTIAL/PLATELET
Basophils Absolute: 31 cells/uL (ref 0–200)
Basophils Relative: 0.5 %
MCV: 79.1 fL — ABNORMAL LOW (ref 80.0–100.0)
Neutro Abs: 3032 cells/uL (ref 1500–7800)
WBC: 6.2 10*3/uL (ref 3.8–10.8)

## 2022-12-20 MED ORDER — TADALAFIL 20 MG PO TABS
10.0000 mg | ORAL_TABLET | ORAL | 11 refills | Status: AC | PRN
  Filled 2022-12-20: qty 10, 20d supply, fill #0

## 2022-12-20 NOTE — Progress Notes (Signed)
Subjective:    Patient ID: Lawrence Liu, male    DOB: 28-Apr-1956, 67 y.o.   MRN: [id number]  Rash    Past medical history is significant for aortic aneurysm repair in 2019.  He also has a history of nonobstructive coronary artery disease as listed below.  With patient is a very pleasant 67 year old Caucasian gentleman here today for a checkup.  He denies any chest pain, shortness of breath, dyspnea on exertion.  His blood pressure is well-controlled on Lotrel 10/40 p.o. daily.  He is taking atorvastatin without any myalgias or right upper quadrant pain.  He is requesting a refill on Cialis that he uses occasionally.  He denies taking any nitroglycerin.  He does have senile purpura on the dorsums of both forearms.  This was a rash that he was concerned about.  He denies any other bleeding or bruising anywhere else on his body Past Medical History:  Diagnosis Date   Alpha galactosidase deficiency    multiple tick bites   Deep vein thrombosis (HCC) 2004   Left deep vein thrombosis of left calf   GERD (gastroesophageal reflux disease)    History of blood transfusion    as a baby for jaundice   Hypertension    Nonobstructive atherosclerosis of coronary artery 09/2013   Mild diffuse disease. No lesions greater than 30% noted. - confirmed by Cor CTA 12/2016: Cor Ca++ Score 153. Normal Coronary Origings. R Dominant.  Mild Non-obstructive Plaque in RCA, LAD & OM1 --> Rec Med Rx of CRFs   Pneumonia 1980's   walking pneumonia   Status post ascending aortic aneurysm repair 08/2016   Dr. Laneta Simmers   Thoracic ascending aortic aneurysm [facility]) 11/06/2013   CTA of chest November 2017 showed notable increase in size --> s/p Open (Valve Sparing) Repair Jan 2019)   Past Surgical History:  Procedure Laterality Date   ASCENDING AORTIC ROOT REPLACEMENT N/A 08/15/2017   Procedure: ASCENDING AORTIC ROOT REPLACEMENT, (VALVE SPARING ROOT REPLACEMENT);  Surgeon: Alleen Borne, MD;  Location: [facility] OR;  Service:  Open Heart Surgery;  Laterality: N/A;   CHOLECYSTECTOMY N/A 07/24/2018   Procedure: LAPAROSCOPIC CHOLECYSTECTOMY WITH INTRAOPERATIVE CHOLANGIOGRAM;  Surgeon: Ovidio Kin, MD;  Location: [facility] ORS;  Service: General;  Laterality: N/A;   COLONOSCOPY WITH PROPOFOL N/A 11/23/2016   Procedure: COLONOSCOPY WITH PROPOFOL;  Surgeon: Charna Elizabeth, MD;  Location: [facility] ENDOSCOPY;  Service: Endoscopy;  Laterality: N/A;   colonscopy  5 1/2 yrs ago   polyps removed   EYE SURGERY Bilateral    LEFT HEART CATHETERIZATION WITH CORONARY ANGIOGRAM N/A 10/03/2013   Procedure: LEFT HEART CATHETERIZATION WITH CORONARY ANGIOGRAM;  Surgeon: Dolores Patty, MD;  Location: [facility] CATH LAB;  Service: Cardiovascular:  Nonobstructive disease - LAD diffuse 20-30%. Small distal vessel. Circumflex ostial 30-40%. Large OM 1 with 30% mid. Small OM 2 and several small PL branches. Large dominant RCA with 30% mid and diffuse 20-30% distal.   SHOULDER ARTHROSCOPY  07/15/2011   Procedure: ARTHROSCOPY SHOULDER;  Surgeon: Vania Rea Supple;  Location: [facility] OR;  Service: Orthopedics;  Laterality: Right;  RIGHT ARTHROSCOPY SUBACROMIAL DECOMPRESSION AND DISTAL CLAVICLE RESECTION   TEE WITHOUT CARDIOVERSION N/A 08/15/2017   Procedure: TRANSESOPHAGEAL ECHOCARDIOGRAM (TEE);  Surgeon: Alleen Borne, MD;  Location: [facility] OR;  Service: Open Heart Surgery;  Laterality: N/A;   TONSILLECTOMY  ~ 1970   adenoids also   TRANSTHORACIC ECHOCARDIOGRAM  March 2015   Upper limit size LV. Mild LVH. EF 60%. Normal wall motion. Ascending  aorta measures 48 mm at root tapering to 40 mm at proximal ascending. Mild RV dilation.   Current Outpatient Medications on File Prior to Visit  Medication Sig Dispense Refill   amLODipine-benazepril (LOTREL) 5-20 MG capsule Take 1 capsule by mouth daily. 30 capsule 0   Ascorbic Acid (VITAMIN C) 1000 MG tablet Take 1,000 mg by mouth daily.     aspirin 81 MG EC tablet Take 81 mg by mouth daily. Swallow whole.     atorvastatin (LIPITOR)  20 MG tablet Take 1 tablet (20 mg total) by mouth daily. 30 tablet 0   Cholecalciferol (VITAMIN D3) 5000 units CAPS Take 5,000 Units by mouth daily.     Coenzyme Q10 (CO Q-10) 300 MG CAPS Take 300 mg by mouth daily.     Cyanocobalamin (VITAMIN B-12) 5000 MCG TBDP Take 5,000 mcg by mouth daily.      EPINEPHrine (EPIPEN 2-PAK) 0.3 mg/0.3 mL IJ SOAJ injection Inject 0.3 mLs (0.3 mg total) into the muscle as needed for anaphylaxis (for severe allergic reaction with difficulty breathing, tongue or lip swelling, throat tightness). 1 each 0   famotidine (PEPCID) 20 MG tablet Take 20 mg by mouth daily.     fexofenadine (ALLEGRA) 180 MG tablet Take 180 mg by mouth daily.     metoprolol succinate (TOPROL-XL) 100 MG 24 hr tablet Take 1 tablet (100 mg total) by mouth daily. 30 tablet 0   vitamin E 400 UNIT capsule Take 800 Units by mouth daily.      No current facility-administered medications on file prior to visit.   Allergies  Allergen Reactions   Latex Anaphylaxis   Penicillins Hives    Whencombined with sulfa, CAN take penicillin by itself Has patient had a PCN reaction causing immediate rash, facial/tongue/throat swelling, SOB or lightheadedness with hypotension: Yes Has patient had a PCN reaction causing severe rash involving mucus membranes or skin necrosis: No Has patient had a PCN reaction that required hospitalization: No Has patient had a PCN reaction occurring within the last 10 years: No If all of the above answers are "NO", then may proceed with Cephalosporin use.   Sulfa Antibiotics Hives    Only when mixed sulfa and pencillin together   Social History   Socioeconomic History   Marital status: Married    Spouse name: Not on file   Number of children: Not on file   Years of education: Not on file   Highest education level: Not on file  Occupational History   Not on file  Tobacco Use   Smoking status: Never   Smokeless tobacco: Never  Vaping Use   Vaping Use: Never used   Substance and Sexual Activity   Alcohol use: Yes    Comment: 10/02/2013 "hard apple cider maybe once/month"   Drug use: No   Sexual activity: Yes  Other Topics Concern   Not on file  Social History Narrative   Not on file   Social Determinants of Health   Financial Resource Strain: Not on file  Food Insecurity: Not on file  Transportation Needs: Not on file  Physical Activity: Not on file  Stress: Not on file  Social Connections: Not on file  Intimate Partner Violence: Not on file   Family History  Problem Relation Age of Onset   Heart disease Father    Diabetes Father    Diabetes Mother       Review of Systems  Skin:  Positive for rash.  All other systems reviewed and  are negative.      Objective:   Physical Exam Vitals reviewed.  Constitutional:      General: He is not in acute distress.    Appearance: Normal appearance. He is normal weight. He is not ill-appearing, toxic-appearing or diaphoretic.  HENT:     Head: Normocephalic and atraumatic.     Right Ear: Tympanic membrane, ear canal and external ear normal. There is no impacted cerumen.     Left Ear: Tympanic membrane, ear canal and external ear normal. There is no impacted cerumen.     Nose: Nose normal. No congestion or rhinorrhea.     Mouth/Throat:     Mouth: Mucous membranes are moist.     Pharynx: Oropharynx is clear. No oropharyngeal exudate or posterior oropharyngeal erythema.  Eyes:     General: No scleral icterus.       Right eye: No discharge.        Left eye: No discharge.     Extraocular Movements: Extraocular movements intact.     Conjunctiva/sclera: Conjunctivae normal.     Pupils: Pupils are equal, round, and reactive to light.  Neck:     Vascular: No carotid bruit.  Cardiovascular:     Rate and Rhythm: Normal rate and regular rhythm.     Pulses: Normal pulses.     Heart sounds: Normal heart sounds. No murmur heard.    No friction rub. No gallop.  Pulmonary:     Effort: Pulmonary  effort is normal. No respiratory distress.     Breath sounds: Normal breath sounds. No stridor. No wheezing, rhonchi or rales.  Chest:     Chest wall: No tenderness.  Abdominal:     General: Bowel sounds are normal. There is no distension.     Palpations: Abdomen is soft. There is no mass.     Tenderness: There is no abdominal tenderness. There is no right CVA tenderness, left CVA tenderness, guarding or rebound.     Hernia: No hernia is present.  Genitourinary:    Prostate: Normal.     Rectum: Normal.  Musculoskeletal:     Cervical back: Normal range of motion and neck supple. No rigidity. No muscular tenderness.     Right lower leg: No edema.     Left lower leg: No edema.  Lymphadenopathy:     Cervical: No cervical adenopathy.  Skin:    General: Skin is warm.     Coloration: Skin is not jaundiced or pale.     Findings: No bruising, erythema, lesion or rash.  Neurological:     General: No focal deficit present.     Mental Status: He is alert and oriented to person, place, and time. Mental status is at baseline.     Cranial Nerves: No cranial nerve deficit.     Sensory: No sensory deficit.     Motor: No weakness.     Coordination: Coordination normal.     Gait: Gait normal.     Deep Tendon Reflexes: Reflexes normal.  Psychiatric:        Mood and Affect: Mood normal.        Behavior: Behavior normal.        Thought Content: Thought content normal.        Judgment: Judgment normal.           Assessment & Plan:  Essential hypertension - Plan: CBC with Differential/Platelet, Lipid panel, COMPLETE METABOLIC PANEL WITH GFR  Pure hypercholesterolemia - Plan: CBC with Differential/Platelet, Lipid panel, COMPLETE  METABOLIC PANEL WITH GFR  Prostate cancer screening - Plan: PSA The patient's blood pressure is excellent.  I will make no changes to the Lotrel 10/40.  He does have trace bipedal edema but this does not bother him.  If worsening I will get an echocardiogram  especially if he reported any shortness of breath or dyspnea on exertion.  Check a fasting lipid panel.  I like his LDL cholesterol to be less than 55.  Check a CBC and a CMP along with this.  Check a PSA to screen for prostate cancer.  I did give the patient a prescription for Cialis and warned him not to use the medication with nitroglycerin

## 2022-12-21 LAB — CBC WITH DIFFERENTIAL/PLATELET
Absolute Monocytes: 372 cells/uL (ref 200–950)
Eosinophils Absolute: 471 cells/uL (ref 15–500)
Eosinophils Relative: 7.6 %
HCT: 41.9 % (ref 38.5–50.0)
Hemoglobin: 14.2 g/dL (ref 13.2–17.1)
Lymphs Abs: 2294 cells/uL (ref 850–3900)
MCH: 26.8 pg — ABNORMAL LOW (ref 27.0–33.0)
MCHC: 33.9 g/dL (ref 32.0–36.0)
MPV: 10.2 fL (ref 7.5–12.5)
Monocytes Relative: 6 %
Neutrophils Relative %: 48.9 %
Platelets: 195 10*3/uL (ref 140–400)
RBC: 5.3 10*6/uL (ref 4.20–5.80)
RDW: 13.5 % (ref 11.0–15.0)
Total Lymphocyte: 37 %

## 2022-12-21 LAB — COMPLETE METABOLIC PANEL WITHOUT GFR
AG Ratio: 1.8 (calc) (ref 1.0–2.5)
ALT: 23 U/L (ref 9–46)
AST: 19 U/L (ref 10–35)
Albumin: 4.1 g/dL (ref 3.6–5.1)
Alkaline phosphatase (APISO): 64 U/L (ref 35–144)
BUN: 19 mg/dL (ref 7–25)
CO2: 23 mmol/L (ref 20–32)
Calcium: 9.2 mg/dL (ref 8.6–10.3)
Chloride: 109 mmol/L (ref 98–110)
Creat: 0.82 mg/dL (ref 0.70–1.35)
Globulin: 2.3 g/dL (ref 1.9–3.7)
Glucose, Bld: 104 mg/dL — ABNORMAL HIGH (ref 65–99)
Potassium: 4.1 mmol/L (ref 3.5–5.3)
Sodium: 141 mmol/L (ref 135–146)
Total Bilirubin: 0.7 mg/dL (ref 0.2–1.2)
Total Protein: 6.4 g/dL (ref 6.1–8.1)
eGFR: 98 mL/min/{1.73_m2}

## 2022-12-21 LAB — LIPID PANEL
Cholesterol: 109 mg/dL
HDL: 43 mg/dL
LDL Cholesterol (Calc): 47 mg/dL
Non-HDL Cholesterol (Calc): 66 mg/dL
Total CHOL/HDL Ratio: 2.5 (calc)
Triglycerides: 104 mg/dL

## 2022-12-21 LAB — PSA: PSA: 1.81 ng/mL (ref <=4.00)

## 2023-01-18 ENCOUNTER — Other Ambulatory Visit: Payer: Self-pay | Admitting: Family Medicine

## 2023-01-18 ENCOUNTER — Other Ambulatory Visit (HOSPITAL_COMMUNITY): Payer: Self-pay

## 2023-01-18 MED ORDER — ATORVASTATIN CALCIUM 20 MG PO TABS
20.0000 mg | ORAL_TABLET | Freq: Every day | ORAL | 3 refills | Status: DC
  Filled 2023-01-18: qty 90, 90d supply, fill #0
  Filled 2023-05-16: qty 90, 90d supply, fill #1

## 2023-01-18 MED ORDER — METOPROLOL SUCCINATE ER 100 MG PO TB24
100.0000 mg | ORAL_TABLET | Freq: Every day | ORAL | 1 refills | Status: DC
  Filled 2023-01-18: qty 90, 90d supply, fill #0
  Filled 2023-05-16: qty 90, 90d supply, fill #1

## 2023-01-18 MED ORDER — AMLODIPINE BESY-BENAZEPRIL HCL 5-20 MG PO CAPS
1.0000 | ORAL_CAPSULE | Freq: Every day | ORAL | 1 refills | Status: DC
  Filled 2023-01-18: qty 90, 90d supply, fill #0
  Filled 2023-05-16: qty 90, 90d supply, fill #1

## 2023-01-18 NOTE — Telephone Encounter (Signed)
Requested Prescriptions  Pending Prescriptions Disp Refills   amLODipine-benazepril (LOTREL) 5-20 MG capsule 90 capsule 1    Sig: Take 1 capsule by mouth daily.     Cardiovascular: CCB + ACEI Combos Failed - 01/18/2023  8:08 AM      Failed - Valid encounter within last 6 months    Recent Outpatient Visits           1 year ago Essential hypertension   [facility] Tanya Nones, Priscille Heidelberg, MD   2 years ago Essential hypertension   [facility] Tanya Nones, Priscille Heidelberg, MD   3 years ago General medical exam   [facility] Donita Brooks, MD   4 years ago Acute bronchospasm   [facility] Danelle Berry, PA-C   4 years ago Bacterial respiratory infection   [facility] Medicine Dorena Bodo, PA-C              Passed - Cr in normal range and within 180 days    Creat  Date Value Ref Range Status  12/20/2022 0.82 0.70 - 1.35 mg/dL Final         Passed - K in normal range and within 180 days    Potassium  Date Value Ref Range Status  12/20/2022 4.1 3.5 - 5.3 mmol/L Final         Passed - Na in normal range and within 180 days    Sodium  Date Value Ref Range Status  12/20/2022 141 135 - 146 mmol/L Final  02/08/2020 142 134 - 144 mmol/L Final         Passed - eGFR is 30 or above and within 180 days    GFR, Est African American  Date Value Ref Range Status  06/12/2020 102 > OR = 60 mL/min/1.62m2 Final   GFR, Est Non African American  Date Value Ref Range Status  06/12/2020 88 > OR = 60 mL/min/1.39m2 Final   GFR  Date Value Ref Range Status  04/23/2014 76.85 >60.00 mL/min Final   eGFR  Date Value Ref Range Status  12/20/2022 98 > OR = 60 mL/min/1.77m2 Final         Passed - Patient is not pregnant      Passed - Last BP in normal range    BP Readings from Last 1 Encounters:  12/20/22 120/78          atorvastatin (LIPITOR) 20 MG tablet 90 tablet 3    Sig: Take 1 tablet (20 mg total) by mouth daily.      Cardiovascular:  Antilipid - Statins Failed - 01/18/2023  8:08 AM      Failed - Valid encounter within last 12 months    Recent Outpatient Visits           1 year ago Essential hypertension   [facility] Tanya Nones, Priscille Heidelberg, MD   2 years ago Essential hypertension   [facility] Tanya Nones, Priscille Heidelberg, MD   3 years ago General medical exam   [facility] Donita Brooks, MD   4 years ago Acute bronchospasm   [facility] Medicine Danelle Berry, PA-C   4 years ago Bacterial respiratory infection   [facility] Medicine Allayne Butcher B, PA-C              Failed - Lipid Panel in normal range within the last 12 months    Cholesterol, Total  Date Value Ref Range Status  10/18/2016 105 100 - 199 mg/dL Final   Cholesterol  Date Value Ref Range Status  12/20/2022 109 <200 mg/dL Final   LDL Cholesterol (Calc)  Date Value Ref Range Status  12/20/2022 47 mg/dL (calc) Final    Comment:    Reference range: <100 . Desirable range <100 mg/dL for primary prevention;   <70 mg/dL for patients with CHD or diabetic patients  with > or = 2 CHD risk factors. Marland Kitchen LDL-C is now calculated using the Martin-Hopkins  calculation, which is a validated novel method providing  better accuracy than the Friedewald equation in the  estimation of LDL-C.  Horald Pollen et al. Lenox Ahr. 1610;960(45): 2061-2068  (http://education.[facility].com/faq/FAQ164)    HDL  Date Value Ref Range Status  12/20/2022 43 > OR = 40 mg/dL Final  40/98/1191 34 (L) >39 mg/dL Final   Triglycerides  Date Value Ref Range Status  12/20/2022 104 <150 mg/dL Final         Passed - Patient is not pregnant       metoprolol succinate (TOPROL-XL) 100 MG 24 hr tablet 90 tablet 1    Sig: Take 1 tablet (100 mg total) by mouth daily.     Cardiovascular:  Beta Blockers Failed - 01/18/2023  8:08 AM      Failed - Valid encounter within last 6 months    Recent  Outpatient Visits           1 year ago Essential hypertension   [facility] Pickard, Priscille Heidelberg, MD   2 years ago Essential hypertension   [facility] Tanya Nones, Priscille Heidelberg, MD   3 years ago General medical exam   [facility] Donita Brooks, MD   4 years ago Acute bronchospasm   [facility] Danelle Berry, PA-C   4 years ago Bacterial respiratory infection   [facility] Medicine Allayne Butcher B, PA-C              Passed - Last BP in normal range    BP Readings from Last 1 Encounters:  12/20/22 120/78         Passed - Last Heart Rate in normal range    Pulse Readings from Last 1 Encounters:  12/20/22 81

## 2023-04-08 ENCOUNTER — Telehealth: Payer: Self-pay | Admitting: *Deleted

## 2023-04-08 DIAGNOSIS — Z9889 Other specified postprocedural states: Secondary | ICD-10-CM

## 2023-04-08 DIAGNOSIS — Z01818 Encounter for other preprocedural examination: Secondary | ICD-10-CM

## 2023-04-08 DIAGNOSIS — I77810 Thoracic aortic ectasia: Secondary | ICD-10-CM

## 2023-04-08 NOTE — Telephone Encounter (Signed)
-----   Message from Ok Edwards sent at 04/07/2023 10:37 AM EDT ----- Regarding: BMET for testing Would you please enter an order for blood work for his CT on 04-21-23,  Thank you.

## 2023-04-08 NOTE — Telephone Encounter (Signed)
BMP placed for  upcoming CT scan  Patient is aware

## 2023-04-14 DIAGNOSIS — I77810 Thoracic aortic ectasia: Secondary | ICD-10-CM | POA: Diagnosis not present

## 2023-04-14 DIAGNOSIS — Z9889 Other specified postprocedural states: Secondary | ICD-10-CM | POA: Diagnosis not present

## 2023-04-14 DIAGNOSIS — Z8679 Personal history of other diseases of the circulatory system: Secondary | ICD-10-CM | POA: Diagnosis not present

## 2023-04-14 DIAGNOSIS — Z01818 Encounter for other preprocedural examination: Secondary | ICD-10-CM | POA: Diagnosis not present

## 2023-04-15 LAB — BASIC METABOLIC PANEL
BUN/Creatinine Ratio: 16 (ref 10–24)
BUN: 16 mg/dL (ref 8–27)
CO2: 24 mmol/L (ref 20–29)
Calcium: 8.9 mg/dL (ref 8.6–10.2)
Chloride: 106 mmol/L (ref 96–106)
Creatinine, Ser: 1 mg/dL (ref 0.76–1.27)
Glucose: 125 mg/dL — ABNORMAL HIGH (ref 70–99)
Potassium: 4.3 mmol/L (ref 3.5–5.2)
Sodium: 143 mmol/L (ref 134–144)
eGFR: 84 mL/min/{1.73_m2} (ref >59)

## 2023-04-21 ENCOUNTER — Ambulatory Visit (HOSPITAL_COMMUNITY)
Admission: RE | Admit: 2023-04-21 | Discharge: 2023-04-21 | Disposition: A | Discharge status: Home / Self Care | Payer: 59 | Source: Ambulatory Visit | Attending: Cardiology | Admitting: Cardiology

## 2023-04-21 DIAGNOSIS — I9789 Other postprocedural complications and disorders of the circulatory system, not elsewhere classified: Secondary | ICD-10-CM | POA: Insufficient documentation

## 2023-04-21 DIAGNOSIS — Z9889 Other specified postprocedural states: Secondary | ICD-10-CM | POA: Diagnosis not present

## 2023-04-21 DIAGNOSIS — I2510 Atherosclerotic heart disease of native coronary artery without angina pectoris: Secondary | ICD-10-CM | POA: Diagnosis not present

## 2023-04-21 DIAGNOSIS — I4891 Unspecified atrial fibrillation: Secondary | ICD-10-CM | POA: Diagnosis not present

## 2023-04-21 DIAGNOSIS — Z8679 Personal history of other diseases of the circulatory system: Secondary | ICD-10-CM | POA: Diagnosis not present

## 2023-04-21 DIAGNOSIS — I517 Cardiomegaly: Secondary | ICD-10-CM | POA: Diagnosis not present

## 2023-04-21 MED ORDER — IOHEXOL 350 MG/ML SOLN
75.0000 mL | Freq: Once | INTRAVENOUS | Status: AC | PRN
  Administered 2023-04-21: 75 mL via INTRAVENOUS

## 2023-05-09 ENCOUNTER — Encounter (HOSPITAL_COMMUNITY): Payer: Self-pay

## 2023-05-09 ENCOUNTER — Other Ambulatory Visit (HOSPITAL_COMMUNITY): Payer: Self-pay

## 2023-05-09 ENCOUNTER — Emergency Department (HOSPITAL_COMMUNITY): Payer: 59

## 2023-05-09 ENCOUNTER — Other Ambulatory Visit: Payer: Self-pay

## 2023-05-09 ENCOUNTER — Emergency Department (HOSPITAL_COMMUNITY)
Admission: EM | Admit: 2023-05-09 | Discharge: 2023-05-09 | Disposition: A | Discharge status: Home / Self Care | Payer: 59 | Attending: Emergency Medicine | Admitting: Emergency Medicine

## 2023-05-09 DIAGNOSIS — Z79899 Other long term (current) drug therapy: Secondary | ICD-10-CM | POA: Insufficient documentation

## 2023-05-09 DIAGNOSIS — R29818 Other symptoms and signs involving the nervous system: Secondary | ICD-10-CM | POA: Diagnosis not present

## 2023-05-09 DIAGNOSIS — Z91040 Latex allergy status: Secondary | ICD-10-CM | POA: Diagnosis not present

## 2023-05-09 DIAGNOSIS — I10 Essential (primary) hypertension: Secondary | ICD-10-CM | POA: Insufficient documentation

## 2023-05-09 DIAGNOSIS — R42 Dizziness and giddiness: Secondary | ICD-10-CM | POA: Diagnosis not present

## 2023-05-09 DIAGNOSIS — Z7982 Long term (current) use of aspirin: Secondary | ICD-10-CM | POA: Diagnosis not present

## 2023-05-09 LAB — CBC
HCT: 47.1 % (ref 39.0–52.0)
Hemoglobin: 15.3 g/dL (ref 13.0–17.0)
MCH: 26.9 pg (ref 26.0–34.0)
MCHC: 32.5 g/dL (ref 30.0–36.0)
MCV: 82.9 fL (ref 80.0–100.0)
Platelets: 207 10*3/uL (ref 150–400)
RBC: 5.68 MIL/uL (ref 4.22–5.81)
RDW: 13.7 % (ref 11.5–15.5)
WBC: 6.7 10*3/uL (ref 4.0–10.5)
nRBC: 0 % (ref 0.0–0.2)

## 2023-05-09 LAB — BASIC METABOLIC PANEL
Anion gap: 19 — ABNORMAL HIGH (ref 5–15)
BUN: 20 mg/dL (ref 8–23)
CO2: 23 mmol/L (ref 22–32)
Calcium: 9.6 mg/dL (ref 8.9–10.3)
Chloride: 99 mmol/L (ref 98–111)
Creatinine, Ser: 0.93 mg/dL (ref 0.61–1.24)
GFR, Estimated: >60 mL/min (ref >60)
Glucose, Bld: 125 mg/dL — ABNORMAL HIGH (ref 70–99)
Potassium: 4.4 mmol/L (ref 3.5–5.1)
Sodium: 141 mmol/L (ref 135–145)

## 2023-05-09 MED ORDER — MECLIZINE HCL 25 MG PO TABS
25.0000 mg | ORAL_TABLET | Freq: Once | ORAL | Status: AC
  Administered 2023-05-09: 25 mg via ORAL
  Filled 2023-05-09: qty 1

## 2023-05-09 MED ORDER — AMLODIPINE BESYLATE 5 MG PO TABS
5.0000 mg | ORAL_TABLET | Freq: Every day | ORAL | Status: DC
  Administered 2023-05-09: 5 mg via ORAL
  Filled 2023-05-09: qty 1

## 2023-05-09 MED ORDER — ONDANSETRON HCL 4 MG/2ML IJ SOLN
4.0000 mg | Freq: Once | INTRAMUSCULAR | Status: AC
  Administered 2023-05-09: 4 mg via INTRAVENOUS
  Filled 2023-05-09: qty 2

## 2023-05-09 MED ORDER — METOPROLOL SUCCINATE ER 25 MG PO TB24
100.0000 mg | ORAL_TABLET | Freq: Every day | ORAL | Status: DC
  Administered 2023-05-09: 100 mg via ORAL
  Filled 2023-05-09: qty 4

## 2023-05-09 MED ORDER — BENAZEPRIL HCL 20 MG PO TABS
20.0000 mg | ORAL_TABLET | Freq: Once | ORAL | Status: AC
  Administered 2023-05-09: 20 mg via ORAL
  Filled 2023-05-09: qty 1

## 2023-05-09 MED ORDER — MECLIZINE HCL 12.5 MG PO TABS
12.5000 mg | ORAL_TABLET | Freq: Three times a day (TID) | ORAL | 0 refills | Status: AC | PRN
  Filled 2023-05-09: qty 30, 10d supply, fill #0

## 2023-05-09 MED ORDER — ONDANSETRON HCL 4 MG PO TABS
4.0000 mg | ORAL_TABLET | Freq: Four times a day (QID) | ORAL | 0 refills | Status: AC
  Filled 2023-05-09: qty 12, 3d supply, fill #0

## 2023-05-09 MED ORDER — IOHEXOL 350 MG/ML SOLN
75.0000 mL | Freq: Once | INTRAVENOUS | Status: AC | PRN
  Administered 2023-05-09: 75 mL via INTRAVENOUS

## 2023-05-09 NOTE — ED Notes (Signed)
Patient stated that after the MRI, he stood up and started to feel dizzy again. Patient stated that he feels lightheaded, but when he moves his head "it feels like the world is spinning". Patient stated he is feeling nauseated because of this. MD notified

## 2023-05-09 NOTE — Discharge Instructions (Signed)
Return for any problem.  ?

## 2023-05-09 NOTE — ED Provider Notes (Signed)
[place] EMERGENCY DEPARTMENT AT [facility] Provider Note   CSN: [id number] Arrival date & time: 05/09/23  4010     History  Chief Complaint  Patient presents with   Dizziness    Lawrence Liu is a 67 y.o. male.  Patient is a 67 year old male with a history of thoracic ascending aortic aneurysm status post repair, prior DVT not on anticoagulation, hypertension and GERD who is presenting today with complaint of dizziness.  He reports waking up this morning and feeling fine and went to lay back down in bed and states it felt like a pop in the back of his head and he became very dizzy with the room spinning and feeling nauseated.  He tried to get up and walk and states he almost fell across the room until he hit the wall.  He felt that his arms and legs felt heavy bilaterally but denies any unilateral numbness or weakness.  Symptoms lasted for an hour or so but now he starting to feel better.  He reports the intense dizziness is gone but he still feels a little lightheaded.  No speech issues.  His wife is present and reports that other than the dizziness she did not notice anything else abnormal.  He has not had any recent URI symptoms or illnesses.  No recent flights.  No prior history of vertigo.  No ringing in the ears.  The history is provided by the patient, the spouse and medical records.  Dizziness      Home Medications Prior to Admission medications   Medication Sig Start Date End Date Taking? Authorizing Provider  amLODipine-benazepril (LOTREL) 5-20 MG capsule Take 1 capsule by mouth daily. 01/18/23   Donita Brooks, MD  Ascorbic Acid (VITAMIN C) 1000 MG tablet Take 1,000 mg by mouth daily.    [provider]  aspirin 81 MG EC tablet Take 81 mg by mouth daily. Swallow whole.    [provider]  atorvastatin (LIPITOR) 20 MG tablet Take 1 tablet (20 mg total) by mouth daily. 01/18/23   Donita Brooks, MD  Cholecalciferol (VITAMIN D3) 5000  units CAPS Take 5,000 Units by mouth daily.    [provider]  Coenzyme Q10 (CO Q-10) 300 MG CAPS Take 300 mg by mouth daily.    [provider]  Cyanocobalamin (VITAMIN B-12) 5000 MCG TBDP Take 5,000 mcg by mouth daily.     [provider]  EPINEPHrine (EPIPEN 2-PAK) 0.3 mg/0.3 mL IJ SOAJ injection Inject 0.3 mLs (0.3 mg total) into the muscle as needed for anaphylaxis (for severe allergic reaction with difficulty breathing, tongue or lip swelling, throat tightness). 10/31/19   Rancour, Jeannett Senior, MD  famotidine (PEPCID) 20 MG tablet Take 20 mg by mouth daily.    [provider]  fexofenadine (ALLEGRA) 180 MG tablet Take 180 mg by mouth daily.    [provider]  metoprolol succinate (TOPROL-XL) 100 MG 24 hr tablet Take 1 tablet (100 mg total) by mouth daily. 01/18/23   Donita Brooks, MD  tadalafil (CIALIS) 20 MG tablet Take 1/2-1 tablet (10-20 mg total) by mouth every other day as needed for erectile dysfunction. 12/20/22   Donita Brooks, MD  vitamin E 400 UNIT capsule Take 800 Units by mouth daily.     [provider]      Allergies    Latex, Penicillins, and Sulfa antibiotics    Review of Systems   Review of Systems  Neurological:  Positive  for dizziness.    Physical Exam Updated Vital Signs BP 132/82   Pulse (!) 59   Temp 97.7 F (36.5 C) (Oral)   Resp 19   Ht 6\' 1"  (1.854 m)   Wt 104.6 kg   SpO2 97%   BMI 30.42 kg/m  Physical Exam Vitals and nursing note reviewed.  Constitutional:      General: He is not in acute distress.    Appearance: He is well-developed.  HENT:     Head: Normocephalic and atraumatic.     Right Ear: Tympanic membrane normal.     Left Ear: Tympanic membrane normal.  Eyes:     General: No visual field deficit.    Extraocular Movements: Extraocular movements intact.     Conjunctiva/sclera: Conjunctivae normal.     Pupils: Pupils are equal, round, and reactive to light.     Comments: No  nystagmus  Cardiovascular:     Rate and Rhythm: Normal rate and regular rhythm.     Heart sounds: No murmur heard. Pulmonary:     Effort: Pulmonary effort is normal. No respiratory distress.     Breath sounds: Normal breath sounds. No wheezing or rales.  Abdominal:     General: There is no distension.     Palpations: Abdomen is soft.     Tenderness: There is no abdominal tenderness. There is no guarding or rebound.  Musculoskeletal:        General: No tenderness. Normal range of motion.     Cervical back: Normal range of motion and neck supple.  Skin:    General: Skin is warm and dry.     Findings: No erythema or rash.  Neurological:     Mental Status: He is alert and oriented to person, place, and time.     Cranial Nerves: No cranial nerve deficit, dysarthria or facial asymmetry.     Sensory: Sensation is intact.     Motor: Motor function is intact. No pronator drift.     Coordination: Coordination is intact. Coordination normal. Finger-Nose-Finger Test and Heel to [facility] Test normal.     Gait: Gait is intact.  Psychiatric:        Mood and Affect: Mood normal.        Behavior: Behavior normal.     ED Results / Procedures / Treatments   Labs (all labs ordered are listed, but only abnormal results are displayed) Labs Reviewed  BASIC METABOLIC PANEL - Abnormal; Notable for the following components:      Result Value   Glucose, Bld 125 (*)    Anion gap 19 (*)    All other components within normal limits  CBC    EKG EKG Interpretation Date/Time:  Monday May 09 2023 09:01:36 EDT Ventricular Rate:  73 PR Interval:  180 QRS Duration:  96 QT Interval:  372 QTC Calculation: 409 R Axis:   59  Text Interpretation: Sinus rhythm with occasional Premature ventricular complexes Cannot rule out Anterior infarct , age undetermined When compared with ECG of 31-Oct-2019 06:16, PREVIOUS ECG IS PRESENT Confirmed by Gwyneth Sprout ([id number]) on 05/09/2023 9:32:15 AM  Radiology CT  ANGIO HEAD NECK W WO CM  Result Date: 05/09/2023 CLINICAL DATA:  Vertigo, central. EXAM: CT ANGIOGRAPHY HEAD AND NECK WITH AND WITHOUT CONTRAST TECHNIQUE: Multidetector CT imaging of the head and neck was performed using the standard protocol during bolus administration of intravenous contrast. Multiplanar CT image reconstructions and MIPs were obtained to evaluate the vascular anatomy. Carotid stenosis measurements (when  applicable) are obtained utilizing NASCET criteria, using the distal internal carotid diameter as the denominator. RADIATION DOSE REDUCTION: This exam was performed according to the departmental dose-optimization program which includes automated exposure control, adjustment of the mA and/or kV according to patient size and/or use of iterative reconstruction technique. CONTRAST:  75mL OMNIPAQUE IOHEXOL 350 MG/ML SOLN COMPARISON:  None Available. FINDINGS: CT HEAD FINDINGS Brain: No acute hemorrhage. 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 Cortical gray-white differentiation is otherwise preserved. No hydrocephalus or extra-axial collection. No mass effect or midline shift. Vascular: No hyperdense vessel or unexpected calcification. Skull: No calvarial fracture or suspicious bone lesion. Skull base is unremarkable. Sinuses/Orbits: No acute finding. Other: None. Review of the MIP images confirms the above findings CTA NECK FINDINGS Aortic arch: Two-vessel arch configuration with common origin of the right brachiocephalic and left common carotid arteries. Arch vessel origins are patent. Right carotid system: No evidence of dissection, stenosis (50% or greater), or occlusion. Left carotid system: No evidence of dissection, stenosis (50% or greater), or occlusion. Vertebral arteries: Codominant. No evidence of dissection, stenosis (50% or greater), or occlusion. Skeleton: Unremarkable. Other neck: Unremarkable. Upper chest:  Unremarkable. Review of the MIP images confirms the above findings CTA HEAD FINDINGS Anterior circulation: Intracranial ICAs are patent without stenosis or aneurysm. The proximal ACAs and MCAs are patent without stenosis or aneurysm. Distal branches are symmetric. Posterior circulation: Normal basilar artery. The SCAs, AICAs and PICAs are patent proximally. The PCAs are patent proximally without stenosis or aneurysm. Distal branches are symmetric. Venous sinuses: As permitted by contrast timing, patent. Anatomic variants: None. Review of the MIP images confirms the above findings IMPRESSION: 1. No acute intracranial abnormality. 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 2. No large vessel occlusion, hemodynamically significant stenosis, or aneurysm in the head or neck. Electronically Signed   By: Orvan Falconer M.D.   On: 05/09/2023 13:04    Procedures Procedures    Medications Ordered in ED Medications  amLODipine (NORVASC) tablet 5 mg (5 mg Oral Given 05/09/23 1024)    And  benazepril (LOTENSIN) tablet 20 mg (20 mg Oral Given 05/09/23 1049)  metoprolol succinate (TOPROL-XL) 24 hr tablet 100 mg (100 mg Oral Given 05/09/23 1025)  meclizine (ANTIVERT) tablet 25 mg (25 mg Oral Given 05/09/23 0938)  iohexol (OMNIPAQUE) 350 MG/ML injection 75 mL (75 mLs Intravenous Contrast Given 05/09/23 1150)  meclizine (ANTIVERT) tablet 25 mg (25 mg Oral Given 05/09/23 1258)    ED Course/ Medical Decision Making/ A&P                                 Medical Decision Making Amount and/or Complexity of Data Reviewed Radiology: ordered.  Risk Prescription drug management.   Pt with multiple medical problems and comorbidities and presenting today with a complaint that caries a high risk for morbidity and mortality.  Here today with sudden onset of vertiginous symptoms.  Patient symptoms are now almost completely resolved.  Neuroexam is reassuring  here.  However patient did feel a pop and discomfort in the back of his head will ensure no evidence of vertebral dissection.  Also will rule out central causes of vertigo such as stroke.  Patient is hypertensive here however has not had his morning medications.  He was given warning meds but he denies any chest pain or shortness of breath.  He  has had a prior graft for his thoracic aortic aneurysm but otherwise has no c/o of that today.  3:38 PM I independently interpreted patient's labs and EKG.  EKG without acute findings today, CBC, BMP within normal limits except for an anion gap of 19.  Unclear the cause of this.  I have independently visualized and interpreted pt's images today. Head CT negative for acute bleed, aneurysm or evidence of dissection.  Radiology reports no intracranial abnormality, mild chronic small vessel disease with age indeterminant lacunar infarct in the left cerebellar hemisphere but no evidence of large vessel occlusion or aneurysm.  MRI pending.  When patient laid down and in CT he did have return of symptoms which have improved now that he is set up.  He was given 1 dose of meclizine.         Final Clinical Impression(s) / ED Diagnoses Final diagnoses:  None    Rx / DC Orders ED Discharge Orders     None         Gwyneth Sprout, MD 05/09/23 1540

## 2023-05-09 NOTE — ED Notes (Incomplete)
Patient Lawrence Liu

## 2023-05-09 NOTE — ED Provider Triage Note (Signed)
Emergency Medicine Provider Triage Evaluation Note  Lawrence Liu , a 67 y.o. male  was evaluated in triage.  Pt complains of vertigo.  Review of Systems  Positive: Dizziness Negative: Localizing numbness or weakness  Physical Exam  BP (!) 142/100 (BP Location: Left Arm)   Pulse 70   Temp 97.9 F (36.6 C) (Oral)   Resp 20   Ht 6\' 1"  (1.854 m)   Wt 104.6 kg   SpO2 100%   BMI 30.42 kg/m  Patient awake and appropriate.  Does have some mild nystagmus.  Finger-nose intact bilaterally.  No Romberg.  Medical Decision Making  Medically screening exam initiated at 9:13 AM.  Appropriate orders placed.  Lawrence Liu was informed that the remainder of the evaluation will be completed by another provider, this initial triage assessment does not replace that evaluation, and the importance of remaining in the ED until their evaluation is complete.  Patient was at home.  At 8 AM laid back and felt a strange feeling in the back of his head.  Not pain.  Almost as if car was switching years.  States then developed vertigo.  Severe.  Had had vomiting.  No numbness or weakness.  Differential diagnosis does include central and peripheral cause of vertigo.  Not on thinners.  Overall benign neuroexam but cannot rule out central cause of vertigo.  Will start with CT angiography.   Benjiman Core, MD 05/09/23 [phone]

## 2023-05-09 NOTE — ED Provider Notes (Signed)
Patient seen after prior EDP.  Patient feels significantly improved.  He now desires discharge.  Provided history and exam is most consistent with peripheral vertigo.  MRI does not show evidence of acute infarct.  Patient has PCP and understands need for close outpatient follow-up.  Strict return precautions given and understood.   Wynetta Fines, MD 05/09/23 [phone]

## 2023-05-09 NOTE — ED Notes (Signed)
Patient returned from MRI.

## 2023-05-09 NOTE — ED Triage Notes (Signed)
Pt states he layed down at 0800 and felt a bump on posterior of head and then became dizzy while laying down. Pt states he got nauseated. Pt states sat on edge fo bed and stood up and got dizzy and fell and hit head on wall. Pt denies blood thinners. Pt denies LOC.

## 2023-05-11 ENCOUNTER — Encounter: Payer: Self-pay | Admitting: Family Medicine

## 2023-05-11 ENCOUNTER — Ambulatory Visit: Payer: 59 | Admitting: Family Medicine

## 2023-05-11 VITALS — BP 124/72 | HR 64 | Temp 97.8°F | Ht 73.0 in | Wt 226.0 lb

## 2023-05-11 DIAGNOSIS — R7303 Prediabetes: Secondary | ICD-10-CM | POA: Diagnosis not present

## 2023-05-11 NOTE — Progress Notes (Signed)
Subjective:    Patient ID: Lawrence Liu, male    DOB: 1955/10/24, 67 y.o.   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  He is here today to discuss this.  He denies any chest pain shortness of breath or dyspnea on exertion.  He recently went to the emergency room for vertigo and had a CT angiogram of the neck.  All remaining blood vessels were patent however they did find a coincidental finding of a lacunar infarct in the left cerebellum.  He is currently taking aspirin.  His blood pressure is outstanding.  His most recent LDL cholesterol was less than 70 in May.  However he states that his blood sugars have been elevated recently in the 120 range. 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 No lesions greater than 30% noted. - confirmed by Cor CTA 12/2016: Cor Ca++ Score 153. Normal Coronary Origings. R Dominant.  Mild Non-obstructive Plaque in RCA, LAD & OM1 --> Rec Med Rx of CRFs   Pneumonia 1980's   walking pneumonia   Status post ascending aortic aneurysm repair 08/2016   Dr. Laneta Simmers   Thoracic ascending aortic aneurysm [facility]) 11/06/2013   CTA of chest November 2017 showed notable increase in size --> s/p Open (Valve Sparing) Repair Jan 2019)   Past Surgical History:  Procedure Laterality Date   ASCENDING AORTIC ROOT REPLACEMENT N/A 08/15/2017   Procedure: ASCENDING AORTIC ROOT REPLACEMENT, (VALVE SPARING ROOT REPLACEMENT);  Surgeon: Alleen Borne, MD;  Location: [facility] OR;  Service: Open Heart Surgery;  Laterality: N/A;   CHOLECYSTECTOMY N/A 07/24/2018   Procedure:  LAPAROSCOPIC CHOLECYSTECTOMY WITH INTRAOPERATIVE CHOLANGIOGRAM;  Surgeon: Ovidio Kin, MD;  Location: [facility] ORS;  Service: General;  Laterality: N/A;   COLONOSCOPY WITH PROPOFOL N/A 11/23/2016   Procedure: COLONOSCOPY WITH PROPOFOL;  Surgeon: Charna Elizabeth, MD;  Location: [facility] ENDOSCOPY;  Service: Endoscopy;  Laterality: N/A;   colonscopy  5 1/2 yrs ago   polyps removed   EYE SURGERY Bilateral    LEFT HEART CATHETERIZATION WITH CORONARY ANGIOGRAM N/A 10/03/2013   Procedure: LEFT HEART CATHETERIZATION WITH CORONARY ANGIOGRAM;  Surgeon: Dolores Patty, MD;  Location: [facility] CATH LAB;  Service: Cardiovascular:  Nonobstructive disease - LAD diffuse 20-30%. Small distal vessel. Circumflex ostial 30-40%. Large OM 1 with 30% mid. Small OM 2 and several small PL branches. Large dominant RCA with 30% mid and diffuse 20-30% distal.   SHOULDER ARTHROSCOPY  07/15/2011   Procedure: ARTHROSCOPY SHOULDER;  Surgeon: Vania Rea Supple;  Location: [facility] OR;  Service: Orthopedics;  Laterality: Right;  RIGHT ARTHROSCOPY SUBACROMIAL DECOMPRESSION AND DISTAL CLAVICLE RESECTION   TEE WITHOUT CARDIOVERSION N/A 08/15/2017   Procedure: TRANSESOPHAGEAL ECHOCARDIOGRAM (TEE);  Surgeon: Alleen Borne, MD;  Location: [facility] OR;  Service: Open Heart Surgery;  Laterality: N/A;   TONSILLECTOMY  ~ 1970   adenoids also   TRANSTHORACIC ECHOCARDIOGRAM  March 2015   Upper limit size LV. Mild LVH. EF 60%. Normal wall motion. Ascending aorta measures 48 mm at root tapering  to 40 mm at proximal ascending. Mild RV dilation.   Current Outpatient Medications on File Prior to Visit  Medication Sig Dispense Refill   amLODipine-benazepril (LOTREL) 5-20 MG capsule Take 1 capsule by mouth daily. 90 capsule 1   Ascorbic Acid (VITAMIN C) 1000 MG tablet Take 1,000 mg by mouth daily.     aspirin 81 MG EC tablet Take 81 mg by mouth daily. Swallow whole.     atorvastatin (LIPITOR) 20 MG tablet Take 1 tablet (20 mg total) by mouth daily. 90 tablet 3    Cholecalciferol (VITAMIN D3) 5000 units CAPS Take 5,000 Units by mouth daily.     Coenzyme Q10 (CO Q-10) 300 MG CAPS Take 300 mg by mouth daily.     Cyanocobalamin (VITAMIN B-12) 5000 MCG TBDP Take 5,000 mcg by mouth daily.      EPINEPHrine (EPIPEN 2-PAK) 0.3 mg/0.3 mL IJ SOAJ injection Inject 0.3 mLs (0.3 mg total) into the muscle as needed for anaphylaxis (for severe allergic reaction with difficulty breathing, tongue or lip swelling, throat tightness). 1 each 0   famotidine (PEPCID) 20 MG tablet Take 20 mg by mouth daily.     fexofenadine (ALLEGRA) 180 MG tablet Take 180 mg by mouth daily.     meclizine (ANTIVERT) 12.5 MG tablet Take 1 tablet (12.5 mg total) by mouth 3 (three) times daily as needed for dizziness. 30 tablet 0   metoprolol succinate (TOPROL-XL) 100 MG 24 hr tablet Take 1 tablet (100 mg total) by mouth daily. 90 tablet 1   ondansetron (ZOFRAN) 4 MG tablet Take 1 tablet (4 mg total) by mouth every 6 (six) hours. 12 tablet 0   tadalafil (CIALIS) 20 MG tablet Take 1/2-1 tablet (10-20 mg total) by mouth every other day as needed for erectile dysfunction. 10 tablet 11   vitamin E 400 UNIT capsule Take 800 Units by mouth daily.      No current facility-administered medications on file prior to visit.   Allergies  Allergen Reactions   Latex Anaphylaxis   Penicillins Hives    Whencombined with sulfa, CAN take penicillin by itself Has patient had a PCN reaction causing immediate rash, facial/tongue/throat swelling, SOB or lightheadedness with hypotension: Yes Has patient had a PCN reaction causing severe rash involving mucus membranes or skin necrosis: No Has patient had a PCN reaction that required hospitalization: No Has patient had a PCN reaction occurring within the last 10 years: No If all of the above answers are "NO", then may proceed with Cephalosporin use.   Sulfa Antibiotics Hives    Only when mixed sulfa and pencillin together   Social History   Socioeconomic History    Marital status: Married    Spouse name: Not on file   Number of children: Not on file   Years of education: Not on file   Highest education level: Not on file  Occupational History   Not on file  Tobacco Use   Smoking status: Never   Smokeless tobacco: Never  Vaping Use   Vaping status: Never Used  Substance and Sexual Activity   Alcohol use: Yes    Comment: 10/02/2013 "hard apple cider maybe once/month"   Drug use: No   Sexual activity: Yes  Other Topics Concern   Not on file  Social History Narrative   Not on file   Social Determinants of Health   Financial Resource Strain: Not on file  Food Insecurity: Not on file  Transportation Needs: Not on file  Physical Activity:  Not on file  Stress: Not on file  Social Connections: Not on file  Intimate Partner Violence: Not on file   Family History  Problem Relation Age of Onset   Heart disease Father    Diabetes Father    Diabetes Mother       Review of Systems  Skin:  Positive for rash.  Neurological:  Positive for dizziness.  All other systems reviewed and are negative.      Objective:   Physical Exam Vitals reviewed.  Constitutional:      General: He is not in acute distress.    Appearance: Normal appearance. He is normal weight. He is not ill-appearing, toxic-appearing or diaphoretic.  HENT:     Head: Normocephalic and atraumatic.     Right Ear: Tympanic membrane, ear canal and external ear normal. There is no impacted cerumen.     Left Ear: Tympanic membrane, ear canal and external ear normal. There is no impacted cerumen.     Nose: Nose normal. No congestion or rhinorrhea.     Mouth/Throat:     Mouth: Mucous membranes are moist.     Pharynx: Oropharynx is clear. No oropharyngeal exudate or posterior oropharyngeal erythema.  Eyes:     General: No scleral icterus.       Right eye: No discharge.        Left eye: No discharge.     Extraocular Movements: Extraocular movements intact.      Conjunctiva/sclera: Conjunctivae normal.     Pupils: Pupils are equal, round, and reactive to light.  Neck:     Vascular: No carotid bruit.  Cardiovascular:     Rate and Rhythm: Normal rate and regular rhythm.     Pulses: Normal pulses.     Heart sounds: Normal heart sounds. No murmur heard.    No friction rub. No gallop.  Pulmonary:     Effort: Pulmonary effort is normal. No respiratory distress.     Breath sounds: Normal breath sounds. No stridor. No wheezing, rhonchi or rales.  Chest:     Chest wall: No tenderness.  Abdominal:     General: Bowel sounds are normal. There is no distension.     Palpations: Abdomen is soft. There is no mass.     Tenderness: There is no abdominal tenderness. There is no right CVA tenderness, left CVA tenderness, guarding or rebound.     Hernia: No hernia is present.  Musculoskeletal:     Cervical back: Normal range of motion and neck supple. No rigidity. No muscular tenderness.     Right lower leg: No edema.     Left lower leg: No edema.  Lymphadenopathy:     Cervical: No cervical adenopathy.  Skin:    General: Skin is warm.     Coloration: Skin is not jaundiced or pale.     Findings: No bruising, erythema, lesion or rash.  Neurological:     General: No focal deficit present.     Mental Status: He is alert and oriented to person, place, and time. Mental status is at baseline.     Cranial Nerves: No cranial nerve deficit.     Sensory: No sensory deficit.     Motor: No weakness.     Coordination: Coordination normal.     Gait: Gait normal.     Deep Tendon Reflexes: Reflexes normal.  Psychiatric:        Mood and Affect: Mood normal.        Behavior: Behavior normal.  Thought Content: Thought content normal.        Judgment: Judgment normal.           Assessment & Plan:  Prediabetes - Plan: CBC with Differential/Platelet, COMPLETE METABOLIC PANEL WITH GFR, Lipid panel, Hemoglobin A1c I reassured the patient that the cyst in his  left lung is benign and requires no treatment unless he develops a pneumothorax.  His blood pressure is excellent.  I explained the pathophysiology of the lacunar infarct.  I recommended that we control his risk factors.  He is already on aspirin.  I will recheck his LDL cholesterol.  I like it to be below 70.  I will also check an A1c given his elevated blood sugars and I would like to see this below 6.5

## 2023-05-12 LAB — CBC WITH DIFFERENTIAL/PLATELET
Absolute Monocytes: 393 {cells}/uL (ref 200–950)
Basophils Absolute: 28 {cells}/uL (ref 0–200)
Basophils Relative: 0.4 %
Eosinophils Absolute: 152 {cells}/uL (ref 15–500)
Eosinophils Relative: 2.2 %
HCT: 45.2 % (ref 38.5–50.0)
Hemoglobin: 14.5 g/dL (ref 13.2–17.1)
Lymphs Abs: 2636 {cells}/uL (ref 850–3900)
MCH: 26.4 pg — ABNORMAL LOW (ref 27.0–33.0)
MCHC: 32.1 g/dL (ref 32.0–36.0)
MCV: 82.3 fL (ref 80.0–100.0)
MPV: 10 fL (ref 7.5–12.5)
Monocytes Relative: 5.7 %
Neutro Abs: 3692 {cells}/uL (ref 1500–7800)
Neutrophils Relative %: 53.5 %
Platelets: 215 10*3/uL (ref 140–400)
RBC: 5.49 10*6/uL (ref 4.20–5.80)
RDW: 13.4 % (ref 11.0–15.0)
Total Lymphocyte: 38.2 %
WBC: 6.9 10*3/uL (ref 3.8–10.8)

## 2023-05-12 LAB — LIPID PANEL
Cholesterol: 120 mg/dL (ref <200)
HDL: 44 mg/dL (ref >=40)
LDL Cholesterol (Calc): 58 mg/dL
Non-HDL Cholesterol (Calc): 76 mg/dL (ref <130)
Total CHOL/HDL Ratio: 2.7 (calc) (ref <5.0)
Triglycerides: 98 mg/dL (ref <150)

## 2023-05-12 LAB — COMPLETE METABOLIC PANEL WITH GFR
AG Ratio: 1.8 (calc) (ref 1.0–2.5)
ALT: 21 U/L (ref 9–46)
AST: 17 U/L (ref 10–35)
Albumin: 4.3 g/dL (ref 3.6–5.1)
Alkaline phosphatase (APISO): 69 U/L (ref 35–144)
BUN: 22 mg/dL (ref 7–25)
CO2: 30 mmol/L (ref 20–32)
Calcium: 9.8 mg/dL (ref 8.6–10.3)
Chloride: 105 mmol/L (ref 98–110)
Creat: 1.01 mg/dL (ref 0.70–1.35)
Globulin: 2.4 g/dL (ref 1.9–3.7)
Glucose, Bld: 105 mg/dL — ABNORMAL HIGH (ref 65–99)
Potassium: 4.8 mmol/L (ref 3.5–5.3)
Sodium: 141 mmol/L (ref 135–146)
Total Bilirubin: 0.8 mg/dL (ref 0.2–1.2)
Total Protein: 6.7 g/dL (ref 6.1–8.1)
eGFR: 83 mL/min/{1.73_m2} (ref >=60)

## 2023-05-12 LAB — HEMOGLOBIN A1C
Hgb A1c MFr Bld: 6.1 %{Hb} — ABNORMAL HIGH (ref <5.7)
Mean Plasma Glucose: 128 mg/dL
eAG (mmol/L): 7.1 mmol/L

## 2023-07-22 DIAGNOSIS — H5202 Hypermetropia, left eye: Secondary | ICD-10-CM | POA: Diagnosis not present

## 2023-07-22 DIAGNOSIS — H52223 Regular astigmatism, bilateral: Secondary | ICD-10-CM | POA: Diagnosis not present

## 2023-07-22 DIAGNOSIS — H524 Presbyopia: Secondary | ICD-10-CM | POA: Diagnosis not present

## 2023-08-01 ENCOUNTER — Other Ambulatory Visit (HOSPITAL_COMMUNITY): Payer: Self-pay

## 2023-08-01 DIAGNOSIS — D224 Melanocytic nevi of scalp and neck: Secondary | ICD-10-CM | POA: Diagnosis not present

## 2023-08-01 DIAGNOSIS — L814 Other melanin hyperpigmentation: Secondary | ICD-10-CM | POA: Diagnosis not present

## 2023-08-01 DIAGNOSIS — D225 Melanocytic nevi of trunk: Secondary | ICD-10-CM | POA: Diagnosis not present

## 2023-08-01 DIAGNOSIS — L570 Actinic keratosis: Secondary | ICD-10-CM | POA: Diagnosis not present

## 2023-08-01 DIAGNOSIS — L821 Other seborrheic keratosis: Secondary | ICD-10-CM | POA: Diagnosis not present

## 2023-08-01 DIAGNOSIS — L820 Inflamed seborrheic keratosis: Secondary | ICD-10-CM | POA: Diagnosis not present

## 2023-08-01 MED ORDER — FLUOROURACIL 5 % EX CREA
1.0000 | TOPICAL_CREAM | Freq: Two times a day (BID) | CUTANEOUS | 0 refills | Status: AC
  Filled 2023-08-01: qty 40, 14d supply, fill #0

## 2023-08-23 ENCOUNTER — Other Ambulatory Visit (HOSPITAL_COMMUNITY): Payer: Self-pay

## 2023-08-23 ENCOUNTER — Other Ambulatory Visit: Payer: Self-pay | Admitting: Family Medicine

## 2023-08-23 MED ORDER — AMLODIPINE BESY-BENAZEPRIL HCL 5-20 MG PO CAPS
1.0000 | ORAL_CAPSULE | Freq: Every day | ORAL | 1 refills | Status: DC
  Filled 2023-08-23: qty 90, 90d supply, fill #0

## 2023-08-23 MED ORDER — ATORVASTATIN CALCIUM 20 MG PO TABS
20.0000 mg | ORAL_TABLET | Freq: Every day | ORAL | 3 refills | Status: DC
  Filled 2023-08-23: qty 90, 90d supply, fill #0

## 2023-08-23 MED ORDER — METOPROLOL SUCCINATE ER 100 MG PO TB24
100.0000 mg | ORAL_TABLET | Freq: Every day | ORAL | 1 refills | Status: DC
  Filled 2023-08-23: qty 90, 90d supply, fill #0

## 2023-08-23 NOTE — Telephone Encounter (Signed)
Copied from CRM [id number]. Topic: Clinical - Medication Refill >> Aug 23, 2023 12:57 PM Geroge Baseman wrote: Most Recent Primary Care Visit:  Provider: Lynnea Ferrier T  Department: [facility]  Visit Type: OFFICE VISIT  Date: 05/11/2023  Medication: amLODipine-benazepril (LOTREL) 5-20 MG capsule, metoprolol succinate (TOPROL-XL) 100 MG 24 hr tablet  Has the patient contacted their pharmacy? Yes Stated to call office  Is this the correct pharmacy for this prescription? Yes If no, delete pharmacy and type the correct one.  This is the patient's preferred pharmacy:   [facility] [place] [address] , [place] [address]   Has the prescription been filled recently? No  Is the patient out of the medication? No, not out yet but will be within a week or so  Has the patient been seen for an appointment in the last year OR does the patient have an upcoming appointment? Yes  Can we respond through MyChart? No, please call on the phone  Agent: Please be advised that Rx refills may take up to 3 business days. We ask that you follow-up with your pharmacy.

## 2023-08-23 NOTE — Telephone Encounter (Signed)
Contacted patient due to medication flagging of "Valid Visit Within the Last 6 months." The patient was last seen in office on 05-11-2023. Routing Rx Request to PCP office for further evaluation and clarification on the matter.

## 2023-08-24 ENCOUNTER — Other Ambulatory Visit (HOSPITAL_COMMUNITY): Payer: Self-pay

## 2023-08-24 ENCOUNTER — Other Ambulatory Visit: Payer: Self-pay

## 2023-08-24 ENCOUNTER — Telehealth: Payer: Self-pay | Admitting: Family Medicine

## 2023-08-24 DIAGNOSIS — E7800 Pure hypercholesterolemia, unspecified: Secondary | ICD-10-CM

## 2023-08-24 DIAGNOSIS — I10 Essential (primary) hypertension: Secondary | ICD-10-CM

## 2023-08-24 MED ORDER — AMLODIPINE BESY-BENAZEPRIL HCL 5-20 MG PO CAPS: 1.0000 | ORAL_CAPSULE | Freq: Every day | ORAL | 1 refills | Status: DC

## 2023-08-24 MED ORDER — ATORVASTATIN CALCIUM 20 MG PO TABS: 20.0000 mg | ORAL_TABLET | Freq: Every day | ORAL | 3 refills | Status: DC

## 2023-08-24 MED ORDER — METOPROLOL SUCCINATE ER 100 MG PO TB24: 100.0000 mg | ORAL_TABLET | Freq: Every day | ORAL | 1 refills | Status: DC

## 2023-08-24 NOTE — Telephone Encounter (Signed)
Copied from CRM [id number]. Topic: Clinical - Prescription Issue >> Aug 24, 2023  2:20 PM Lars Mage H wrote: Reason for CRM: Patient called regarding the following prescriptions:  amLODipine-benazepril (LOTREL) 5-20 MG capsule,   metoprolol succinate (TOPROL-XL) 100 MG 24 hr tablet  atorvastatin (LIPITOR) 20 MG tablet  It appears that they were sent to the [facility] - the [facility] [address] - Contact Center agent added this pharmacy to his preferred pharmacies for the future - insurance changed for the patient and he can't get his medications at the [facility] any longer. Patient is asking [place] to send the three [facility].

## 2023-09-10 ENCOUNTER — Ambulatory Visit
Admission: EM | Admit: 2023-09-10 | Discharge: 2023-09-10 | Disposition: A | Discharge status: Home / Self Care | Payer: Medicare Other | Attending: Family Medicine | Admitting: Family Medicine

## 2023-09-10 ENCOUNTER — Other Ambulatory Visit: Payer: Self-pay

## 2023-09-10 ENCOUNTER — Encounter: Payer: Self-pay | Admitting: Emergency Medicine

## 2023-09-10 DIAGNOSIS — S0101XA Laceration without foreign body of scalp, initial encounter: Secondary | ICD-10-CM | POA: Diagnosis not present

## 2023-09-10 DIAGNOSIS — W19XXXA Unspecified fall, initial encounter: Secondary | ICD-10-CM | POA: Diagnosis not present

## 2023-09-10 MED ORDER — CHLORHEXIDINE GLUCONATE 4 % EX SOLN: Freq: Every day | CUTANEOUS | 0 refills | Status: AC | PRN

## 2023-09-10 MED ORDER — LIDOCAINE-EPINEPHRINE-TETRACAINE (LET) TOPICAL GEL
3.0000 mL | Freq: Once | TOPICAL | Status: AC
  Administered 2023-09-10: 3 mL via TOPICAL

## 2023-09-10 NOTE — ED Triage Notes (Signed)
 Pt reports history of vertigo and reports flare-up this am with standing up getting out of bed. Pt denies dizziness or symptoms at this time. Pt denies loc. Laceration with mild bleeding noted to back of head. Pt alert and oriented x4. Steady gait. NAD noted.

## 2023-09-10 NOTE — ED Provider Notes (Signed)
 [facility]    CSN: [id number] Arrival date & time: 09/10/23  9164      History   Chief Complaint Chief Complaint  Patient presents with   Fall    HPI Lawrence Liu is a 68 y.o. male.   Patient presenting today with a laceration to the posterior scalp that occurred this morning when he fell backwards and hit the corner of the nightstand.  States he has been dealing with vertigo diagnosed in the emergency department several months ago off and on and stood up too quickly causing him to lose his balance.  Denies loss of consciousness, severe headache, vision change, mental status changes, nausea, vomiting.  Not currently on any anticoagulation other than baby aspirin .  Last Tdap was 01/2019.    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 No lesions greater than 30% noted. - confirmed by Cor CTA 12/2016: Cor Ca++ Score 153. Normal Coronary Origings. R Dominant.  Mild Non-obstructive Plaque in RCA, LAD & OM1 --> Rec Med Rx of CRFs   Pneumonia 1980's   walking pneumonia   Status post ascending aortic aneurysm repair 08/2016   Dr. Lucas   Thoracic ascending aortic aneurysm (HCC) 11/06/2013   CTA of chest November 2017 showed notable increase in size --> s/p Open (Valve Sparing) Repair Jan 2019)    Patient Active Problem List   Diagnosis Date Noted   Rapid palpitations 06/14/2022   Pain in joint of right shoulder 06/30/2021   Impingement syndrome of left shoulder region 06/09/2021   Pain in joint of left shoulder 05/07/2021   Biliary dyskinesia 06/29/2018   Sensorineural hearing loss (SNHL), bilateral 10/10/2017   Postoperative anemia  09/10/2017   Postoperative atrial fibrillation (HCC) 09/08/2017   S/P aortic aneurysm repair 08/15/2017   Essential hypertension 10/01/2016   Aortic root dilatation (HCC) 11/06/2013   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   PVC's (premature ventricular contractions) 10/02/2013   History of DVT (deep vein thrombosis) 10/02/2013   GERD (gastroesophageal reflux disease)     Past Surgical History:  Procedure Laterality Date   ASCENDING AORTIC ROOT REPLACEMENT N/A 08/15/2017   Procedure: ASCENDING AORTIC ROOT REPLACEMENT, (VALVE SPARING ROOT REPLACEMENT);  Surgeon: Lucas Dorise POUR, MD;  Location: [facility] OR;  Service: Open Heart Surgery;  Laterality: N/A;   CHOLECYSTECTOMY N/A 07/24/2018   Procedure: LAPAROSCOPIC CHOLECYSTECTOMY WITH INTRAOPERATIVE CHOLANGIOGRAM;  Surgeon: Ethyl Lenis, MD;  Location: [facility] ORS;  Service: General;  Laterality: N/A;   COLONOSCOPY WITH PROPOFOL  N/A 11/23/2016   Procedure: COLONOSCOPY WITH PROPOFOL ;  Surgeon: Renaye Sous, MD;  Location: [facility];  Service: Endoscopy;  Laterality: N/A;   colonscopy  5 1/2 yrs ago   polyps removed   EYE SURGERY Bilateral    LEFT HEART CATHETERIZATION WITH CORONARY ANGIOGRAM N/A 10/03/2013   Procedure: LEFT HEART CATHETERIZATION WITH CORONARY ANGIOGRAM;  Surgeon: Toribio JONELLE Fuel, MD;  Location: [facility] CATH LAB;  Service: Cardiovascular:  Nonobstructive disease - LAD diffuse 20-30%. Small distal vessel. Circumflex ostial 30-40%. Large OM 1 with 30% mid. Small OM 2 and several small PL branches. Large dominant RCA with 30% mid and diffuse 20-30% distal.  SHOULDER ARTHROSCOPY  07/15/2011   Procedure: ARTHROSCOPY SHOULDER;  Surgeon: Franky HERO Supple;  Location: [facility] OR;  Service: Orthopedics;  Laterality: Right;  RIGHT ARTHROSCOPY SUBACROMIAL DECOMPRESSION AND DISTAL CLAVICLE RESECTION   TEE WITHOUT CARDIOVERSION N/A 08/15/2017   Procedure: TRANSESOPHAGEAL ECHOCARDIOGRAM (TEE);  Surgeon: Lucas Dorise POUR, MD;  Location: [facility] OR;  Service: Open Heart  Surgery;  Laterality: N/A;   TONSILLECTOMY  ~ 1970   adenoids also   TRANSTHORACIC ECHOCARDIOGRAM  March 2015   Upper limit size LV. Mild LVH. EF 60%. Normal wall motion. Ascending aorta measures 48 mm at root tapering to 40 mm at proximal ascending. Mild RV dilation.       Home Medications    Prior to Admission medications   Medication Sig Start Date End Date Taking? Authorizing Provider  chlorhexidine  (HIBICLENS ) 4 % external liquid Apply topically daily as needed. 09/10/23  Yes Stuart Vernell Norris, PA-C  amLODipine -benazepril  (LOTREL ) 5-20 MG capsule Take 1 capsule by mouth daily. 08/24/23   Duanne Butler DASEN, MD  Ascorbic Acid (VITAMIN C) 1000 MG tablet Take 1,000 mg by mouth daily.    [provider]  aspirin  81 MG EC tablet Take 81 mg by mouth daily. Swallow whole.    [provider]  atorvastatin  (LIPITOR) 20 MG tablet Take 1 tablet (20 mg total) by mouth daily. 08/24/23   Duanne Butler DASEN, MD  Cholecalciferol (VITAMIN D3) 5000 units CAPS Take 5,000 Units by mouth daily.    [provider]  Coenzyme Q10 (CO Q-10) 300 MG CAPS Take 300 mg by mouth daily.    [provider]  Cyanocobalamin (VITAMIN B-12) 5000 MCG TBDP Take 5,000 mcg by mouth daily.     [provider]  EPINEPHrine  (EPIPEN  2-PAK) 0.3 mg/0.3 mL IJ SOAJ injection Inject 0.3 mLs (0.3 mg total) into the muscle as needed for anaphylaxis (for severe allergic reaction with difficulty breathing, tongue or lip swelling, throat tightness). 10/31/19   Rancour, Garnette, MD  famotidine  (PEPCID ) 20 MG tablet Take 20 mg by mouth daily.    [provider]  fexofenadine (ALLEGRA) 180 MG tablet Take 180 mg by mouth daily.    [provider]  meclizine  (ANTIVERT ) 12.5 MG tablet Take 1 tablet (12.5 mg total) by mouth 3 (three) times daily as needed for dizziness. 05/09/23   Laurice Maude BROCKS, MD  metoprolol  succinate (TOPROL -XL) 100 MG 24 hr tablet Take 1 tablet (100 mg total) by  mouth daily. 08/24/23   Duanne Butler DASEN, MD  ondansetron  (ZOFRAN ) 4 MG tablet Take 1 tablet (4 mg total) by mouth every 6 (six) hours. 05/09/23   Laurice Maude BROCKS, MD  tadalafil  (CIALIS ) 20 MG tablet Take 1/2-1 tablet (10-20 mg total) by mouth every other day as needed for erectile dysfunction. 12/20/22   Duanne Butler DASEN, MD  vitamin E 400 UNIT capsule Take 800 Units by mouth daily.     [provider]    Family History Family History  Problem Relation Age of Onset   Heart disease Father    Diabetes Father    Diabetes Mother     Social History Social History   Tobacco Use   Smoking status: Never   Smokeless tobacco: Never  Vaping Use   Vaping status: Never Used  Substance Use Topics   Alcohol use: Yes    Comment: 10/02/2013 hard apple cider maybe once/month   Drug use: No     Allergies   Latex, Penicillins, and Sulfa antibiotics  Review of Systems Review of Systems Per HPI  Physical Exam Triage Vital Signs ED Triage Vitals [09/10/23 0850]  Encounter Vitals Group     BP (!) 145/73     Systolic BP Percentile      Diastolic BP Percentile      Pulse Rate 66     Resp 20     Temp 98.1 F (36.7 C)     Temp Source Oral     SpO2 96 %     Weight      Height      Head Circumference      Peak Flow      Pain Score 0     Pain Loc      Pain Education      Exclude from Growth Chart    No data found.  Updated Vital Signs BP (!) 145/73 (BP Location: Right Arm)   Pulse 66   Temp 98.1 F (36.7 C) (Oral)   Resp 20   SpO2 96%   Visual Acuity Right Eye Distance:   Left Eye Distance:   Bilateral Distance:    Right Eye Near:   Left Eye Near:    Bilateral Near:     Physical Exam Vitals and nursing note reviewed.  Constitutional:      Appearance: Normal appearance.  HENT:     Head: Atraumatic.     Mouth/Throat:     Mouth: Mucous membranes are moist.  Eyes:     Extraocular Movements: Extraocular movements intact.     Conjunctiva/sclera:  Conjunctivae normal.     Pupils: Pupils are equal, round, and reactive to light.  Cardiovascular:     Rate and Rhythm: Normal rate and regular rhythm.  Pulmonary:     Effort: Pulmonary effort is normal.     Breath sounds: Normal breath sounds.  Musculoskeletal:        General: Normal range of motion.     Cervical back: Normal range of motion and neck supple.  Skin:    General: Skin is warm.     Comments: 4 cm very mildly gaping laceration vertically to the posterior scalp.  Bleeding well-controlled after let gel and direct pressure.  No foreign body appreciable  Neurological:     General: No focal deficit present.     Mental Status: He is oriented to person, place, and time.     Cranial Nerves: No cranial nerve deficit.     Motor: No weakness.     Gait: Gait normal.  Psychiatric:        Mood and Affect: Mood normal.        Thought Content: Thought content normal.        Judgment: Judgment normal.      UC Treatments / Results  Labs (all labs ordered are listed, but only abnormal results are displayed) Labs Reviewed - No data to display  EKG   Radiology No results found.  Procedures Laceration Repair  Date/Time: 09/10/2023 10:38 AM  Performed by: Stuart Vernell Norris, PA-C Authorized by: Stuart Vernell Norris, PA-C   Consent:    Consent obtained:  Verbal   Consent given by:  Patient   Risks, benefits, and alternatives were discussed: yes     Risks discussed:  Infection, pain, poor cosmetic result and poor wound healing   Alternatives discussed:  Observation Universal protocol:    Procedure explained and questions answered to patient or proxy's satisfaction: yes     Relevant documents present and verified: yes  Patient identity confirmed:  Verbally with patient and arm band Anesthesia:    Anesthesia method:  Topical application   Topical anesthetic:  LET Laceration details:    Location:  Scalp   Scalp location:  Occipital   Length (cm):  4   Depth (mm):   3 Pre-procedure details:    Preparation:  Patient was prepped and draped in usual sterile fashion Exploration:    Limited defect created (wound extended): no     Hemostasis achieved with:  Direct pressure and LET   Wound exploration: wound explored through full range of motion     Wound extent: no foreign bodies/material noted     Contaminated: no   Treatment:    Area cleansed with:  Chlorhexidine    Amount of cleaning:  Standard   Irrigation solution:  Sterile saline   Irrigation method:  Pressure wash   Visualized foreign bodies/material removed: no     Debridement:  None Skin repair:    Repair method:  Staples   Number of staples:  6 Approximation:    Approximation:  Close Repair type:    Repair type:  Simple Post-procedure details:    Dressing:  Open (no dressing)   Procedure completion:  Tolerated well, no immediate complications  (including critical care time)  Medications Ordered in UC Medications  lidocaine -EPINEPHrine -tetracaine  (LET) topical gel (3 mLs Topical Given 09/10/23 0905)    Initial Impression / Assessment and Plan / UC Course  I have reviewed the triage vital signs and the nursing notes.  Pertinent labs & imaging results that were available during my care of the patient were reviewed by me and considered in my medical decision making (see chart for details).     Vitals and exam very reassuring today, no focal neurologic deficits and he is overall feeling very well.  Let gel was applied to help control bleeding and pain.  Sick staples were placed for closure.  Discussed home wound care with Hibiclens  and ice, over-the-counter pain relievers.  Staple removal in 7 to 10 days. Strict return precautions reviewed. Final Clinical Impressions(s) / UC Diagnoses   Final diagnoses:  Laceration of scalp, initial encounter  Fall, initial encounter     Discharge Instructions      You may clean the area once to twice daily with Hibiclens .  Follow-up for a nurse  visit in 7 to 10 days for staple removal.  Return sooner for worsening symptoms such as redness, significant swelling, thick yellow drainage, fevers    ED Prescriptions     Medication Sig Dispense Auth. Provider   chlorhexidine  (HIBICLENS ) 4 % external liquid Apply topically daily as needed. 236 mL Stuart Vernell Norris, [place]      PDMP not reviewed this encounter.   Stuart Vernell Norris, [place] 09/10/23 1039

## 2023-09-10 NOTE — Discharge Instructions (Signed)
 You may clean the area once to twice daily with Hibiclens .  Follow-up for a nurse visit in 7 to 10 days for staple removal.  Return sooner for worsening symptoms such as redness, significant swelling, thick yellow drainage, fevers

## 2023-09-10 NOTE — ED Notes (Addendum)
 Site cleaned with normal saline. L.E.T applied to back of head. Nonadherent pad placed, secured with perforated tape. Pt tolerated well. Pt can go back to waiting room per PA.

## 2023-09-10 NOTE — ED Notes (Signed)
 Pt called back to treatment room, mild bleeding still noted. L.E.T. dressing removed. Site cleaned with Hibiclens  and sterile water. Bleeding controlled.

## 2023-09-19 ENCOUNTER — Ambulatory Visit
Admission: EM | Admit: 2023-09-19 | Discharge: 2023-09-19 | Disposition: A | Discharge status: Home / Self Care | Payer: Medicare Other

## 2023-09-19 DIAGNOSIS — Z4802 Encounter for removal of sutures: Secondary | ICD-10-CM | POA: Diagnosis not present

## 2023-09-19 NOTE — ED Triage Notes (Signed)
 Pt was seen on 09/10/2023 for a fall and had 6 staples placed in the back of his head and is here for removal. Wound looks clean and dry. No signs of redness irritation or drainage.

## 2024-02-24 ENCOUNTER — Other Ambulatory Visit: Payer: Self-pay | Admitting: Family Medicine

## 2024-02-24 DIAGNOSIS — I10 Essential (primary) hypertension: Secondary | ICD-10-CM

## 2024-02-24 DIAGNOSIS — E7800 Pure hypercholesterolemia, unspecified: Secondary | ICD-10-CM

## 2024-02-24 NOTE — Telephone Encounter (Signed)
 Copied from CRM [id number]. Topic: Clinical - Medication Refill >> Feb 24, 2024  2:43 PM Fonda T wrote: Medication: amLODipine -benazepril  (LOTREL ) 5-20 MG capsule  metoprolol  succinate (TOPROL -XL) 100 MG 24 hr tablet  atorvastatin  (LIPITOR) 20 MG tablet  Requesting 90 day supply above medications   Has the patient contacted their pharmacy? Yes, [place] out [place], and needs refills for 90 day supply until able to find another local PCP   This is the patient's preferred pharmacy:   [id number] [place] [place] - [address] [place] [address] Phone: [phone] Fax: [phone]  Is this the correct pharmacy for this prescription? Yes If no, delete pharmacy and type the correct one.   Has the prescription been filled recently? Yes  Is the patient out of the medication? No  Has the patient been seen for an appointment in the last year OR does the patient have an upcoming appointment? Yes  Can we respond through MyChart? No  Agent: Please be advised that Rx refills may take up to 3 business days. We ask that you follow-up with your pharmacy.

## 2024-02-27 MED ORDER — METOPROLOL SUCCINATE ER 100 MG PO TB24: 100.0000 mg | ORAL_TABLET | Freq: Every day | ORAL | 0 refills | Status: DC

## 2024-02-27 MED ORDER — AMLODIPINE BESY-BENAZEPRIL HCL 5-20 MG PO CAPS: 1.0000 | ORAL_CAPSULE | Freq: Every day | ORAL | 0 refills | Status: DC

## 2024-02-27 MED ORDER — ATORVASTATIN CALCIUM 20 MG PO TABS: 20.0000 mg | ORAL_TABLET | Freq: Every day | ORAL | 0 refills | Status: DC

## 2024-02-27 NOTE — Telephone Encounter (Signed)
 Patient has moved- 90 day Rx forwarded to new pharmacy- should cover patient until he is established with new provider Requested Prescriptions  Pending Prescriptions Disp Refills   amLODipine -benazepril  (LOTREL ) 5-20 MG capsule 90 capsule 0    Sig: Take 1 capsule by mouth daily.     Cardiovascular: CCB + ACEI Combos Failed - 02/27/2024 12:15 PM      Failed - Cr in normal range and within 180 days    Creat  Date Value Ref Range Status  05/11/2023 1.01 0.70 - 1.35 mg/dL Final         Failed - K in normal range and within 180 days    Potassium  Date Value Ref Range Status  05/11/2023 4.8 3.5 - 5.3 mmol/L Final         Failed - Na in normal range and within 180 days    Sodium  Date Value Ref Range Status  05/11/2023 141 135 - 146 mmol/L Final  04/14/2023 143 134 - 144 mmol/L Final         Failed - eGFR is 30 or above and within 180 days    GFR, Est African American  Date Value Ref Range Status  06/12/2020 102 > OR = 60 mL/min/1.9m2 Final   GFR, Est Non African American  Date Value Ref Range Status  06/12/2020 88 > OR = 60 mL/min/1.78m2 Final   GFR, Estimated  Date Value Ref Range Status  05/09/2023 >60 >60 mL/min Final    Comment:    (NOTE) Calculated using the CKD-EPI Creatinine Equation (2021)    GFR  Date Value Ref Range Status  04/23/2014 76.85 >60.00 mL/min Final   eGFR  Date Value Ref Range Status  05/11/2023 83 > OR = 60 mL/min/1.49m2 Final  04/14/2023 84 >59 mL/min/1.73 Final         Failed - Last BP in normal range    BP Readings from Last 1 Encounters:  09/19/23 (!) 131/92         Failed - Valid encounter within last 6 months    Recent Outpatient Visits           9 months ago Prediabetes   [place] [facility] Duanne Butler DASEN, MD   1 year ago Essential hypertension   [place] [facility] Duanne Butler DASEN, MD              Passed - Patient is not pregnant       metoprolol  succinate (TOPROL -XL)  100 MG 24 hr tablet 90 tablet 0    Sig: Take 1 tablet (100 mg total) by mouth daily.     Cardiovascular:  Beta Blockers Failed - 02/27/2024 12:15 PM      Failed - Last BP in normal range    BP Readings from Last 1 Encounters:  09/19/23 (!) 131/92         Failed - Valid encounter within last 6 months    Recent Outpatient Visits           9 months ago Prediabetes   [place] [facility] [place], Butler DASEN, MD   1 year ago Essential hypertension   [facility] Duanne Butler DASEN, MD              Passed - Last Heart Rate in normal range    Pulse Readings from Last 1 Encounters:  09/19/23 62  atorvastatin  (LIPITOR) 20 MG tablet 90 tablet 0    Sig: Take 1 tablet (20 mg total) by mouth daily.     Cardiovascular:  Antilipid - Statins Failed - 02/27/2024 12:15 PM      Failed - Lipid Panel in normal range within the last 12 months    Cholesterol, Total  Date Value Ref Range Status  10/18/2016 105 100 - 199 mg/dL Final   Cholesterol  Date Value Ref Range Status  05/11/2023 120 <200 mg/dL Final   LDL Cholesterol (Calc)  Date Value Ref Range Status  05/11/2023 58 mg/dL (calc) Final    Comment:    Reference range: <100 . Desirable range <100 mg/dL for primary prevention;   <70 mg/dL for patients with CHD or diabetic patients  with > or = 2 CHD risk factors. SABRA LDL-C is now calculated using the Martin-Hopkins  calculation, which is a validated novel method providing  better accuracy than the Friedewald equation in the  estimation of LDL-C.  Gladis APPLETHWAITE et al. SANDREA. 7986;689(80): 2061-2068  (http://education.[employer].com/faq/FAQ164)    HDL  Date Value Ref Range Status  05/11/2023 44 > OR = 40 mg/dL Final  96/80/7981 34 (L) >39 mg/dL Final   Triglycerides  Date Value Ref Range Status  05/11/2023 98 <150 mg/dL Final         Passed - Patient is not pregnant      Passed - Valid encounter within last 12  months    Recent Outpatient Visits           9 months ago Prediabetes   [facility] Pickard, Butler DASEN, MD   1 year ago Essential hypertension   [place] [facility] Pickard, Butler DASEN, MD

## 2024-03-03 ENCOUNTER — Other Ambulatory Visit: Payer: Self-pay | Admitting: Family Medicine

## 2024-03-03 DIAGNOSIS — I10 Essential (primary) hypertension: Secondary | ICD-10-CM

## 2024-03-07 ENCOUNTER — Other Ambulatory Visit: Payer: Self-pay | Admitting: Family Medicine

## 2024-03-07 DIAGNOSIS — I10 Essential (primary) hypertension: Secondary | ICD-10-CM

## 2024-05-24 ENCOUNTER — Other Ambulatory Visit: Payer: Self-pay | Admitting: Family Medicine

## 2024-05-24 DIAGNOSIS — I10 Essential (primary) hypertension: Secondary | ICD-10-CM

## 2024-05-24 DIAGNOSIS — E7800 Pure hypercholesterolemia, unspecified: Secondary | ICD-10-CM

## 2024-08-26 ENCOUNTER — Other Ambulatory Visit: Payer: Self-pay | Admitting: Family Medicine

## 2024-08-26 DIAGNOSIS — I10 Essential (primary) hypertension: Secondary | ICD-10-CM

## 2024-08-27 NOTE — Telephone Encounter (Signed)
 Courtesy refill given, appointment needed.   Requested Prescriptions  Pending Prescriptions Disp Refills   amLODipine -benazepril  (LOTREL ) 5-20 MG capsule [Pharmacy Med Name: AMLODIPINE -BENAZEPRIL  5-20 MG] 90 capsule 0    Sig: TAKE 1 CAPSULE BY MOUTH EVERY DAY     Cardiovascular: CCB + ACEI Combos Failed - 08/27/2024  1:48 PM      Failed - Cr in normal range and within 180 days    Creat  Date Value Ref Range Status  05/11/2023 1.01 0.70 - 1.35 mg/dL Final         Failed - K in normal range and within 180 days    Potassium  Date Value Ref Range Status  05/11/2023 4.8 3.5 - 5.3 mmol/L Final         Failed - Na in normal range and within 180 days    Sodium  Date Value Ref Range Status  05/11/2023 141 135 - 146 mmol/L Final  04/14/2023 143 134 - 144 mmol/L Final         Failed - eGFR is 30 or above and within 180 days    GFR, Est African American  Date Value Ref Range Status  06/12/2020 102 > OR = 60 mL/min/1.26m2 Final   GFR, Est Non African American  Date Value Ref Range Status  06/12/2020 88 > OR = 60 mL/min/1.52m2 Final   GFR, Estimated  Date Value Ref Range Status  05/09/2023 >60 >60 mL/min Final    Comment:    (NOTE) Calculated using the CKD-EPI Creatinine Equation (2021)    GFR  Date Value Ref Range Status  04/23/2014 76.85 >60.00 mL/min Final   eGFR  Date Value Ref Range Status  05/11/2023 83 > OR = 60 mL/min/1.46m2 Final  04/14/2023 84 >59 mL/min/1.73 Final         Failed - Last BP in normal range    BP Readings from Last 1 Encounters:  09/19/23 (!) 131/92         Failed - Valid encounter within last 6 months    Recent Outpatient Visits           1 year ago Prediabetes   [place] Duanne Butler DASEN, MD   1 year ago Essential hypertension   [place] [facility] Duanne Butler DASEN, MD              Passed - Patient is not pregnant       metoprolol  succinate (TOPROL -XL) 100 MG 24 hr tablet  [Pharmacy Med Name: METOPROLOL  SUCC ER 100 MG TAB] 90 tablet 0    Sig: TAKE 1 TABLET BY MOUTH EVERY DAY     Cardiovascular:  Beta Blockers Failed - 08/27/2024  1:48 PM      Failed - Last BP in normal range    BP Readings from Last 1 Encounters:  09/19/23 (!) 131/92         Failed - Valid encounter within last 6 months    Recent Outpatient Visits           1 year ago Prediabetes   [facility] Duanne Butler DASEN, MD   1 year ago Essential hypertension   [place] [facility] Duanne Butler DASEN, MD              Passed - Last Heart Rate in normal range    Pulse Readings from Last 1 Encounters:  09/19/23 62

## 2024-08-28 ENCOUNTER — Other Ambulatory Visit: Payer: Self-pay

## 2024-08-28 DIAGNOSIS — E7800 Pure hypercholesterolemia, unspecified: Secondary | ICD-10-CM

## 2024-08-28 NOTE — Telephone Encounter (Signed)
 Prescription Request  08/28/2024  LOV:05/11/23  What is the name of the medication or equipment? atorvastatin  (LIPITOR) 20 MG tablet [[id number]]   Have you contacted your pharmacy to request a refill? Yes   Which pharmacy would you like this sent to?  [id number] [place], [place] - [address] [place] [address] Phone: [phone] Fax: [phone]    Patient notified that their request is being sent to the clinical staff for review and that they should receive a response within 2 business days.   Please advise at [facility] [phone]

## 2024-08-28 NOTE — Telephone Encounter (Signed)
 Needs appt or call to verify if patient has new primary

## 2024-08-29 NOTE — Telephone Encounter (Signed)
 Requested by interface surescripts. Patient no longer under prescribers care. Switched to another provider.  Requested Prescriptions  Refused Prescriptions Disp Refills   atorvastatin  (LIPITOR) 20 MG tablet 90 tablet 0    Sig: Take 1 tablet (20 mg total) by mouth daily.     Cardiovascular:  Antilipid - Statins Failed - 08/29/2024  1:45 PM      Failed - Valid encounter within last 12 months    Recent Outpatient Visits           1 year ago Prediabetes   [place] [facility] Pickard, Butler DASEN, MD   1 year ago Essential hypertension   [place] [facility] Duanne Butler DASEN, MD              Failed - Lipid Panel in normal range within the last 12 months    Cholesterol, Total  Date Value Ref Range Status  10/18/2016 105 100 - 199 mg/dL Final   Cholesterol  Date Value Ref Range Status  05/11/2023 120 <200 mg/dL Final   LDL Cholesterol (Calc)  Date Value Ref Range Status  05/11/2023 58 mg/dL (calc) Final    Comment:    Reference range: <100 . Desirable range <100 mg/dL for primary prevention;   <70 mg/dL for patients with CHD or diabetic patients  with > or = 2 CHD risk factors. SABRA LDL-C is now calculated using the Martin-Hopkins  calculation, which is a validated novel method providing  better accuracy than the Friedewald equation in the  estimation of LDL-C.  Gladis APPLETHWAITE et al. SANDREA. 7986;689(80): 2061-2068  (http://education.[facility].com/faq/FAQ164)    HDL  Date Value Ref Range Status  05/11/2023 44 > OR = 40 mg/dL Final  96/80/7981 34 (L) >39 mg/dL Final   Triglycerides  Date Value Ref Range Status  05/11/2023 98 <150 mg/dL Final         Passed - Patient is not pregnant

## 2024-08-29 NOTE — Telephone Encounter (Signed)
 Called patient to schedule appt. [place] and will not need refills from [facility].
